# Patient Record
Sex: Female | Born: 1950 | Race: White | Hispanic: No | Smoking: Never smoker
Health system: Southern US, Community
[De-identification: ages and names within clinical notes are randomized; demographics above are authoritative.]

## PROBLEM LIST (undated history)

## (undated) DIAGNOSIS — K7581 Nonalcoholic steatohepatitis (NASH): Secondary | ICD-10-CM

## (undated) DIAGNOSIS — Z6841 Body Mass Index (BMI) 40.0 and over, adult: Secondary | ICD-10-CM

## (undated) DIAGNOSIS — R188 Other ascites: Secondary | ICD-10-CM

## (undated) DIAGNOSIS — A419 Sepsis, unspecified organism: Secondary | ICD-10-CM

## (undated) DIAGNOSIS — K7689 Other specified diseases of liver: Secondary | ICD-10-CM

## (undated) DIAGNOSIS — I700 Atherosclerosis of aorta: Secondary | ICD-10-CM

## (undated) DIAGNOSIS — N200 Calculus of kidney: Secondary | ICD-10-CM

## (undated) DIAGNOSIS — I10 Essential (primary) hypertension: Secondary | ICD-10-CM

## (undated) DIAGNOSIS — I5041 Acute combined systolic (congestive) and diastolic (congestive) heart failure: Secondary | ICD-10-CM

## (undated) DIAGNOSIS — R6521 Severe sepsis with septic shock: Secondary | ICD-10-CM

## (undated) DIAGNOSIS — G4733 Obstructive sleep apnea (adult) (pediatric): Secondary | ICD-10-CM

## (undated) DIAGNOSIS — I428 Other cardiomyopathies: Secondary | ICD-10-CM

## (undated) DIAGNOSIS — I509 Heart failure, unspecified: Secondary | ICD-10-CM

## (undated) DIAGNOSIS — N179 Acute kidney failure, unspecified: Secondary | ICD-10-CM

## (undated) DIAGNOSIS — K529 Noninfective gastroenteritis and colitis, unspecified: Secondary | ICD-10-CM

## (undated) DIAGNOSIS — K5792 Diverticulitis of intestine, part unspecified, without perforation or abscess without bleeding: Secondary | ICD-10-CM

## (undated) DIAGNOSIS — K5790 Diverticulosis of intestine, part unspecified, without perforation or abscess without bleeding: Secondary | ICD-10-CM

## (undated) HISTORY — DX: Body Mass Index (BMI) 40.0 and over, adult: Z684

## (undated) HISTORY — PX: TONSILLECTOMY: SUR1361

## (undated) HISTORY — DX: Diverticulitis of intestine, part unspecified, without perforation or abscess without bleeding: K57.92

## (undated) HISTORY — DX: Diverticulosis of intestine, part unspecified, without perforation or abscess without bleeding: K57.90

## (undated) HISTORY — DX: Essential (primary) hypertension: I10

## (undated) HISTORY — DX: Acute combined systolic (congestive) and diastolic (congestive) heart failure: I50.41

## (undated) HISTORY — DX: Atherosclerosis of aorta: I70.0

## (undated) HISTORY — DX: Calculus of kidney: N20.0

## (undated) HISTORY — DX: Other ascites: R18.8

## (undated) HISTORY — DX: Obstructive sleep apnea (adult) (pediatric): G47.33

## (undated) HISTORY — DX: Other specified diseases of liver: K76.89

## (undated) HISTORY — DX: Acute kidney failure, unspecified: N17.9

## (undated) HISTORY — DX: Nonalcoholic steatohepatitis (NASH): K75.81

## (undated) HISTORY — DX: Noninfective gastroenteritis and colitis, unspecified: K52.9

## (undated) HISTORY — DX: Morbid (severe) obesity due to excess calories: E66.01

## (undated) HISTORY — DX: Severe sepsis with septic shock: R65.21

## (undated) HISTORY — DX: Sepsis, unspecified organism: A41.9

## (undated) HISTORY — DX: Other cardiomyopathies: I42.8

---

## 1982-02-11 HISTORY — PX: DILATION AND CURETTAGE OF UTERUS: SHX78

## 2013-02-11 HISTORY — PX: CARDIAC CATHETERIZATION: SHX172

## 2013-06-11 DIAGNOSIS — K7689 Other specified diseases of liver: Secondary | ICD-10-CM

## 2013-06-11 DIAGNOSIS — I509 Heart failure, unspecified: Secondary | ICD-10-CM

## 2013-06-11 DIAGNOSIS — I5041 Acute combined systolic (congestive) and diastolic (congestive) heart failure: Secondary | ICD-10-CM

## 2013-06-11 DIAGNOSIS — G4733 Obstructive sleep apnea (adult) (pediatric): Secondary | ICD-10-CM

## 2013-06-11 DIAGNOSIS — R188 Other ascites: Secondary | ICD-10-CM

## 2013-06-11 DIAGNOSIS — I428 Other cardiomyopathies: Secondary | ICD-10-CM

## 2013-06-11 HISTORY — DX: Other ascites: R18.8

## 2013-06-11 HISTORY — DX: Other specified diseases of liver: K76.89

## 2013-06-11 HISTORY — DX: Other cardiomyopathies: I42.8

## 2013-06-11 HISTORY — DX: Obstructive sleep apnea (adult) (pediatric): G47.33

## 2013-06-11 HISTORY — DX: Heart failure, unspecified: I50.9

## 2013-06-11 HISTORY — DX: Acute combined systolic (congestive) and diastolic (congestive) heart failure: I50.41

## 2013-07-07 ENCOUNTER — Encounter (HOSPITAL_COMMUNITY): Payer: Self-pay | Admitting: Emergency Medicine

## 2013-07-07 ENCOUNTER — Inpatient Hospital Stay (HOSPITAL_COMMUNITY)
Admission: EM | Admit: 2013-07-07 | Discharge: 2013-07-13 | DRG: 287 | Disposition: A | Discharge status: Home / Self Care | Payer: BC Managed Care – PPO | Attending: Internal Medicine | Admitting: Internal Medicine

## 2013-07-07 ENCOUNTER — Emergency Department (HOSPITAL_COMMUNITY): Payer: BC Managed Care – PPO

## 2013-07-07 DIAGNOSIS — E8779 Other fluid overload: Secondary | ICD-10-CM

## 2013-07-07 DIAGNOSIS — Z79899 Other long term (current) drug therapy: Secondary | ICD-10-CM

## 2013-07-07 DIAGNOSIS — Z8249 Family history of ischemic heart disease and other diseases of the circulatory system: Secondary | ICD-10-CM

## 2013-07-07 DIAGNOSIS — R000 Tachycardia, unspecified: Secondary | ICD-10-CM

## 2013-07-07 DIAGNOSIS — E8770 Fluid overload, unspecified: Secondary | ICD-10-CM | POA: Diagnosis present

## 2013-07-07 DIAGNOSIS — IMO0001 Reserved for inherently not codable concepts without codable children: Secondary | ICD-10-CM

## 2013-07-07 DIAGNOSIS — I509 Heart failure, unspecified: Secondary | ICD-10-CM

## 2013-07-07 DIAGNOSIS — K7460 Unspecified cirrhosis of liver: Secondary | ICD-10-CM

## 2013-07-07 DIAGNOSIS — I428 Other cardiomyopathies: Secondary | ICD-10-CM

## 2013-07-07 DIAGNOSIS — R030 Elevated blood-pressure reading, without diagnosis of hypertension: Secondary | ICD-10-CM

## 2013-07-07 DIAGNOSIS — I5021 Acute systolic (congestive) heart failure: Secondary | ICD-10-CM

## 2013-07-07 DIAGNOSIS — R188 Other ascites: Secondary | ICD-10-CM

## 2013-07-07 DIAGNOSIS — I5041 Acute combined systolic (congestive) and diastolic (congestive) heart failure: Secondary | ICD-10-CM

## 2013-07-07 DIAGNOSIS — K7689 Other specified diseases of liver: Secondary | ICD-10-CM | POA: Diagnosis present

## 2013-07-07 DIAGNOSIS — I110 Hypertensive heart disease with heart failure: Principal | ICD-10-CM | POA: Diagnosis present

## 2013-07-07 DIAGNOSIS — I2789 Other specified pulmonary heart diseases: Secondary | ICD-10-CM | POA: Diagnosis present

## 2013-07-07 DIAGNOSIS — I119 Hypertensive heart disease without heart failure: Secondary | ICD-10-CM

## 2013-07-07 DIAGNOSIS — I10 Essential (primary) hypertension: Secondary | ICD-10-CM

## 2013-07-07 DIAGNOSIS — Z6841 Body Mass Index (BMI) 40.0 and over, adult: Secondary | ICD-10-CM

## 2013-07-07 LAB — COMPREHENSIVE METABOLIC PANEL
ALT: 19 U/L (ref 0–35)
AST: 25 U/L (ref 0–37)
Albumin: 3.3 g/dL — ABNORMAL LOW (ref 3.5–5.2)
Alkaline Phosphatase: 107 U/L (ref 39–117)
BUN: 13 mg/dL (ref 6–23)
CO2: 24 mEq/L (ref 19–32)
Calcium: 10.1 mg/dL (ref 8.4–10.5)
Chloride: 98 mEq/L (ref 96–112)
Creatinine, Ser: 1.05 mg/dL (ref 0.50–1.10)
GFR calc Af Amer: 66 mL/min — ABNORMAL LOW (ref >90)
GFR calc non Af Amer: 57 mL/min — ABNORMAL LOW (ref >90)
Glucose, Bld: 93 mg/dL (ref 70–99)
Potassium: 3.8 mEq/L (ref 3.7–5.3)
Sodium: 137 mEq/L (ref 137–147)
Total Bilirubin: 1.3 mg/dL — ABNORMAL HIGH (ref 0.3–1.2)
Total Protein: 7.1 g/dL (ref 6.0–8.3)

## 2013-07-07 LAB — I-STAT TROPONIN, ED: Troponin i, poc: 0.02 ng/mL (ref 0.00–0.08)

## 2013-07-07 LAB — CBC
HCT: 43.8 % (ref 36.0–46.0)
Hemoglobin: 14.1 g/dL (ref 12.0–15.0)
MCH: 28.5 pg (ref 26.0–34.0)
MCHC: 32.2 g/dL (ref 30.0–36.0)
MCV: 88.7 fL (ref 78.0–100.0)
Platelets: 312 10*3/uL (ref 150–400)
RBC: 4.94 MIL/uL (ref 3.87–5.11)
RDW: 16 % — ABNORMAL HIGH (ref 11.5–15.5)
WBC: 9.2 10*3/uL (ref 4.0–10.5)

## 2013-07-07 LAB — D-DIMER, QUANTITATIVE: D-Dimer, Quant: 1.52 ug/mL-FEU — ABNORMAL HIGH (ref 0.00–0.48)

## 2013-07-07 LAB — PRO B NATRIURETIC PEPTIDE: Pro B Natriuretic peptide (BNP): 3249 pg/mL — ABNORMAL HIGH (ref 0–125)

## 2013-07-07 MED ORDER — ACETAMINOPHEN 650 MG RE SUPP: 650.0000 mg | Freq: Four times a day (QID) | RECTAL | Status: DC | PRN

## 2013-07-07 MED ORDER — IOHEXOL 350 MG/ML SOLN
80.0000 mL | Freq: Once | INTRAVENOUS | Status: AC | PRN
  Administered 2013-07-07: 80 mL via INTRAVENOUS

## 2013-07-07 MED ORDER — SODIUM CHLORIDE 0.9 % IJ SOLN
3.0000 mL | Freq: Two times a day (BID) | INTRAMUSCULAR | Status: DC
  Administered 2013-07-07 – 2013-07-08 (×2): 3 mL via INTRAVENOUS

## 2013-07-07 MED ORDER — SODIUM CHLORIDE 0.9 % IJ SOLN
3.0000 mL | Freq: Two times a day (BID) | INTRAMUSCULAR | Status: DC
  Administered 2013-07-07 – 2013-07-09 (×3): 3 mL via INTRAVENOUS

## 2013-07-07 MED ORDER — ENOXAPARIN SODIUM 40 MG/0.4ML ~~LOC~~ SOLN
40.0000 mg | SUBCUTANEOUS | Status: DC
  Administered 2013-07-07 – 2013-07-12 (×6): 40 mg via SUBCUTANEOUS
  Filled 2013-07-07 (×7): qty 0.4

## 2013-07-07 MED ORDER — DOCUSATE SODIUM 100 MG PO CAPS
100.0000 mg | ORAL_CAPSULE | Freq: Three times a day (TID) | ORAL | Status: DC | PRN
  Filled 2013-07-07: qty 1

## 2013-07-07 MED ORDER — FUROSEMIDE 10 MG/ML IJ SOLN
40.0000 mg | Freq: Four times a day (QID) | INTRAMUSCULAR | Status: DC
  Administered 2013-07-07 – 2013-07-08 (×2): 40 mg via INTRAVENOUS
  Filled 2013-07-07 (×6): qty 4

## 2013-07-07 MED ORDER — POTASSIUM CHLORIDE CRYS ER 20 MEQ PO TBCR
20.0000 meq | EXTENDED_RELEASE_TABLET | Freq: Every day | ORAL | Status: DC
  Administered 2013-07-07 – 2013-07-09 (×3): 20 meq via ORAL
  Filled 2013-07-07 (×5): qty 1

## 2013-07-07 MED ORDER — HYDRALAZINE HCL 20 MG/ML IJ SOLN
10.0000 mg | INTRAMUSCULAR | Status: DC | PRN
Start: 2013-07-07 — End: 2013-07-10
  Administered 2013-07-08: 10 mg via INTRAVENOUS
  Filled 2013-07-07: qty 1

## 2013-07-07 MED ORDER — ACETAMINOPHEN 325 MG PO TABS
650.0000 mg | ORAL_TABLET | Freq: Four times a day (QID) | ORAL | Status: DC | PRN
  Administered 2013-07-08 (×2): 650 mg via ORAL
  Filled 2013-07-07 (×2): qty 2

## 2013-07-07 MED ORDER — ONDANSETRON HCL 4 MG PO TABS: 4.0000 mg | ORAL_TABLET | Freq: Four times a day (QID) | ORAL | Status: DC | PRN

## 2013-07-07 MED ORDER — ONDANSETRON HCL 4 MG/2ML IJ SOLN: 4.0000 mg | Freq: Four times a day (QID) | INTRAMUSCULAR | Status: DC | PRN

## 2013-07-07 MED ORDER — FUROSEMIDE 10 MG/ML IJ SOLN
40.0000 mg | Freq: Once | INTRAMUSCULAR | Status: AC
  Administered 2013-07-07: 40 mg via INTRAVENOUS
  Filled 2013-07-07: qty 4

## 2013-07-07 MED ORDER — ASPIRIN EC 81 MG PO TBEC
81.0000 mg | DELAYED_RELEASE_TABLET | Freq: Every day | ORAL | Status: DC
  Administered 2013-07-07 – 2013-07-11 (×5): 81 mg via ORAL
  Filled 2013-07-07 (×5): qty 1

## 2013-07-07 NOTE — ED Provider Notes (Signed)
CSN: [id number]     Arrival date & time 07/07/13  1703 History   None    Chief Complaint  Patient presents with  . Shortness of Breath    HPI  Phyllis Todd is a 63 y.o. female with no PMH who presents to the ED for evaluation of SOB. History was provided by the patient. Patient states she has had gradually worsening SOB over the past several weeks. She states she has mild SOB at rest which is worse with exertion and ambulation. She also has had to sleep upright at night in a recliner due to SOB. She has had a mild non-productive cough. No hemoptysis. She also complains of bilateral leg edema which has been gradually increasing as well as a swelling in her abdomen throughout. She denies any hx of tobacco abuse. She has not been to a doctor in 16 years. She went to a non-established provider today and was sent to the ED for further evaluation. Has an appointment next month to establish care with a doctor at [facility], but does not currently have a PCP. She denies any chest pain. No history of DVT, PE, clotting disorder, cancer, recent surgery, immobilization, travel, or estrogen supplementation. She denies any abdominal pain, nausea, or emesis. She has had good appetite and activity. She has had mild constipation but her last BM was this morning. She denies any PMH. States she feels a little anxiety about being sent to the ED.    History reviewed. No pertinent past medical history. Past Surgical History  Procedure Laterality Date  . Cesarean section     History reviewed. No pertinent family history. History  Substance Use Topics  . Smoking status: Never Smoker   . Smokeless tobacco: Not on file  . Alcohol Use: No   OB History   Grav Para Term Preterm Abortions TAB SAB Ect Mult Living                 Review of Systems  Constitutional: Negative for fever, chills, diaphoresis, activity change, appetite change and fatigue.  HENT: Negative for congestion, rhinorrhea and sore throat.    Respiratory: Positive for cough and shortness of breath. Negative for chest tightness and wheezing.   Cardiovascular: Positive for leg swelling. Negative for chest pain.  Gastrointestinal: Positive for constipation and abdominal distention. Negative for nausea, vomiting, abdominal pain, diarrhea, blood in stool and rectal pain.  Genitourinary: Negative for dysuria and difficulty urinating.  Musculoskeletal: Negative for back pain, gait problem, myalgias and neck pain.  Skin: Negative for color change and wound.  Neurological: Negative for dizziness, weakness, light-headedness and headaches.  Psychiatric/Behavioral: The patient is nervous/anxious.     Allergies  Codeine and Demerol  Home Medications   Prior to Admission medications   Not on File   BP 166/110  Pulse 110  Temp(Src) 97.7 F (36.5 C) (Oral)  Resp 24  Ht 5\' 4"  (1.626 m)  Wt 370 lb 8 oz (168.058 kg)  BMI 63.57 kg/m2  SpO2 97%  Filed Vitals:   07/07/13 2104 07/07/13 2132 07/07/13 2326 07/08/13 0126  BP: 184/95 195/93 160/92 165/103  Pulse: 113 111  106  Temp:  98.1 F (36.7 C)  97.3 F (36.3 C)  TempSrc:  Oral  Oral  Resp:  20  20  Height:  5\' 4"  (1.626 m)    Weight:  364 lb 9.6 oz (165.381 kg)    SpO2: 99% 96%  95%    Physical Exam  Nursing note and vitals  reviewed. Constitutional: She is oriented to person, place, and time. She appears well-developed and well-nourished. No distress.  HENT:  Head: Normocephalic and atraumatic.  Right Ear: External ear normal.  Left Ear: External ear normal.  Nose: Nose normal.  Mouth/Throat: Oropharynx is clear and moist. No oropharyngeal exudate.  Eyes: Conjunctivae are normal. Right eye exhibits no discharge. Left eye exhibits no discharge.  Neck: Normal range of motion. Neck supple.  Cardiovascular: Normal rate, regular rhythm and normal heart sounds.  Exam reveals no gallop and no friction rub.   No murmur heard. Pulmonary/Chest: Effort normal and breath sounds  normal. No respiratory distress. She has no wheezes. She has no rales. She exhibits no tenderness.  Crackles heard in the right lung base. Patient able to speak in full sentences. Is currently on 2L O2. Mild increased respiratory effort.   Abdominal: Soft. Bowel sounds are normal. She exhibits distension. She exhibits no mass. There is no tenderness. There is no rebound and no guarding.  Distended protuberant abdomen with pitting trace edema throughout. No tenderness.   Musculoskeletal: Normal range of motion. She exhibits edema. She exhibits no tenderness.  Pitting leg edema throughout equal bilaterally. Rough cracked skin in the lower extremities. No open wounds. No tenderness to palpation to the LE throughout  Neurological: She is alert and oriented to person, place, and time.  Skin: Skin is warm and dry. She is not diaphoretic.     ED Course  Procedures (including critical care time) Labs Review Labs Reviewed  CBC  PRO B NATRIURETIC PEPTIDE  COMPREHENSIVE METABOLIC PANEL  Randolm Idol, ED    Imaging Review Dg Chest 2 View  07/07/2013   CLINICAL DATA:  Difficulty breathing. No history of smoking. Chest pain.  EXAM: CHEST  2 VIEW  COMPARISON:  None.  FINDINGS: The heart is enlarged. The mediastinal contours are normal. There is interstitial prominence throughout both lungs without abnormality of the pulmonary vasculature. There is no confluent airspace opacity or significant pleural effusion. The osseous structures appear normal.  IMPRESSION: Cardiomegaly with generalized interstitial prominence. No definite edema or focal airspace disease.   Electronically Signed   By: Camie Patience M.D.   On: 07/07/2013 19:55   Dg Abd 1 View  07/07/2013   CLINICAL DATA:  Difficulty breathing when lying supine.  Nonsmoker.  EXAM: ABDOMEN - 1 VIEW  COMPARISON:  None.  FINDINGS: Single erect view of the abdomen demonstrates no evidence of pneumoperitoneum. The visualized bowel gas pattern is normal. The  pelvis is not imaged. No suspicious calcifications are seen.  IMPRESSION: No evidence of pneumoperitoneum. The visualized abdominal contents appear unremarkable.   Electronically Signed   By: Camie Patience M.D.   On: 07/07/2013 19:56   Ct Angio Chest Pe W/cm &/or Wo Cm  07/07/2013   CLINICAL DATA:  SOB, bilateral edema  EXAM: CT ANGIOGRAPHY CHEST WITH CONTRAST  TECHNIQUE: Multidetector CT imaging of the chest was performed using the standard protocol during bolus administration of intravenous contrast. Multiplanar CT image reconstructions and MIPs were obtained to evaluate the vascular anatomy.  CONTRAST:  25mL OMNIPAQUE IOHEXOL 350 MG/ML SOLN  COMPARISON:  None.  FINDINGS: Satisfactory opacification of pulmonary arteries noted, and there is no evidence of pulmonary emboli. Patient breathing during the acquisition degrades some of the images. There is four-chamber cardiac enlargement. Thoracic aorta is nondilated, incompletely opacified. Small bilateral pleural effusions. No pericardial effusion. Subcentimeter prevascular, AP window, precarinal, and right paratracheal lymph nodes. No definite hilar adenopathy. Extensive ground-glass opacities  throughout both lungs involving the upper lobes more than bases.  There is a small amount of perihepatic and perisplenic ascites. Remainder visualized upper abdomen unremarkable. Small spurs at multiple contiguous levels in the thoracic spine.  Review of the MIP images confirms the above findings.  IMPRESSION: 1. No large or central pulmonary emboli. 2. Extensive ground-glass opacities throughout both lungs, favor edema over alveolitis. 3. Perihepatic and perisplenic ascites.   Electronically Signed   By: Arne Cleveland M.D.   On: 07/07/2013 20:20     EKG Interpretation   Date/Time:  Wednesday Jul 07 2013 18:45:46 EDT Ventricular Rate:  111 PR Interval:  147 QRS Duration: 87 QT Interval:  334 QTC Calculation: 829 R Axis:   97 Text Interpretation:  Sinus  tachycardia Right axis deviation Low voltage,  precordial leads Probable anteroseptal infarct, old Minimal ST depression,  lateral leads Confirmed by WARD,  DO, KRISTEN [phone]) on 07/07/2013 6:48:12  PM      Results for orders placed during the hospital encounter of 07/07/13  CBC      Result Value Ref Range   WBC 9.2  4.0 - 10.5 K/uL   RBC 4.94  3.87 - 5.11 MIL/uL   Hemoglobin 14.1  12.0 - 15.0 g/dL   HCT 43.8  36.0 - 46.0 %   MCV 88.7  78.0 - 100.0 fL   MCH 28.5  26.0 - 34.0 pg   MCHC 32.2  30.0 - 36.0 g/dL   RDW 16.0 (*) 11.5 - 15.5 %   Platelets 312  150 - 400 K/uL  PRO B NATRIURETIC PEPTIDE      Result Value Ref Range   Pro B Natriuretic peptide (BNP) 3249.0 (*) 0 - 125 pg/mL  COMPREHENSIVE METABOLIC PANEL      Result Value Ref Range   Sodium 137  137 - 147 mEq/L   Potassium 3.8  3.7 - 5.3 mEq/L   Chloride 98  96 - 112 mEq/L   CO2 24  19 - 32 mEq/L   Glucose, Bld 93  70 - 99 mg/dL   BUN 13  6 - 23 mg/dL   Creatinine, Ser 1.05  0.50 - 1.10 mg/dL   Calcium 10.1  8.4 - 10.5 mg/dL   Total Protein 7.1  6.0 - 8.3 g/dL   Albumin 3.3 (*) 3.5 - 5.2 g/dL   AST 25  0 - 37 U/L   ALT 19  0 - 35 U/L   Alkaline Phosphatase 107  39 - 117 U/L   Total Bilirubin 1.3 (*) 0.3 - 1.2 mg/dL   GFR calc non Af Amer 57 (*) >90 mL/min   GFR calc Af Amer 66 (*) >90 mL/min  D-DIMER, QUANTITATIVE      Result Value Ref Range   D-Dimer, Quant 1.52 (*) 0.00 - 0.48 ug/mL-FEU  I-STAT TROPOININ, ED      Result Value Ref Range   Troponin i, poc 0.02  0.00 - 0.08 ng/mL   Comment 3              MDM   Phyllis Todd is a 63 y.o. female with no PMH who presents to the ED for evaluation of SOB. SOB likely due to new onset CHF. Patient's chest x-ray revealed cardiomegaly with generalized interstitial prominence and no definite edema. BNP elevated at 3249 however baseline unknown. Patient denied any chest pain. EKG negative for any acute ischemic changes. Troponin negative. Patient's HR elevated in the 110's  during her ED visit. D-dimer elevated.  CT chest negative for PE however is suggestive of pulmonary edema and abdominal ascites, which is also likely from fluid overload. No hypoxia. Patient given IV lasix in the ED with increased urine production. Her BP was elevated during her ED visit with no hx of HTN or other dx medical conditions. Patient had no abdominal pain and her abdominal exam was benign with no peritoneal signs. Abdominal series negative. Patient admitted for further evaluation and management (Dr. Hal Hope accepting).    Final impressions: 1. Congestive heart failure   2. Ascites   3. Tachycardia   4. Hypertension   5. Fluid overload      Mercy Moore PA-C   This patient was discussed with Dr. Kathlee Nations, PA-C 07/08/13 [phone]

## 2013-07-07 NOTE — H&P (Signed)
[facility] History and Physical  Phyllis Todd YNW:[id number] DOB: November 24, 1950 DOA: 07/07/2013  Referring physician: ER physician. PCP: PROVIDER NOT IN SYSTEM  Chief Complaint: Shortness of breath and increasing abdominal girth.  HPI: Phyllis Todd is a 63 y.o. female with no significant past medical history has been experiencing increasing shortness of breath over the last 2 months with increasing abdominal girth and lower extremity edema. Patient has been sleeping propped up over the last 2 months. Denies any chest pain fever chills. Has been having some nonproductive cough. In the ER chest x-ray shows cardiomegaly and CT head of the chest shows diffuse groundglass opacity concerning for alveolitis versus CHF. Patient's BNP has been elevated and on exam patient has significant abdominal distention and lower extremity edema. Patient has made good urine output after Lasix 40 mg IV. Patient has been admitted for further workup. Patient denies any nausea vomiting abdominal pain diarrhea headache visual symptoms.   Review of Systems: As presented in the history of presenting illness, rest negative.  Past Medical History  Diagnosis Date  . Medical history non-contributory    Past Surgical History  Procedure Laterality Date  . Cesarean section    . Tonsillectomy     Social History:  reports that she has never smoked. She does not have any smokeless tobacco history on file. She reports that she does not drink alcohol or use illicit drugs. Where does patient live home. Can patient participate in ADLs? Yes.  Allergies  Allergen Reactions  . Codeine Other (See Comments)    "makes me hyper"  . Demerol [Meperidine] Other (See Comments)    "sick"  . Peanut-Containing Drug Products Hives and Swelling    "almonds"    Family History:  Family History  Problem Relation Age of Onset  . Stroke Mother   . Hypertension Mother   . CAD Father   . Hypertension Father       Prior to Admission  medications   Medication Sig Start Date End Date Taking? Authorizing Provider  acetaminophen (TYLENOL) 500 MG tablet Take 500 mg by mouth every 6 (six) hours as needed (pain).   Yes Historical Provider, MD  diphenhydrAMINE (BENADRYL) 25 mg capsule Take 25 mg by mouth every 8 (eight) hours as needed for allergies.    Yes Historical Provider, MD  docusate sodium (COLACE) 100 MG capsule Take 100 mg by mouth 3 (three) times daily as needed for mild constipation.   Yes Historical Provider, MD  Multiple Vitamin (MULTIVITAMIN WITH MINERALS) TABS tablet Take 1 tablet by mouth daily.   Yes Historical Provider, MD    Physical Exam: Filed Vitals:   07/07/13 1724 07/07/13 1749 07/07/13 2018 07/07/13 2019  BP:  179/117 168/115   Pulse:  116  116  Temp:  97.8 F (36.6 C)    TempSrc:  Oral    Resp:  27  29  Height: 5\' 4"  (1.626 m)     Weight: 168.058 kg (370 lb 8 oz)     SpO2:  96%  97%     General:  Well-developed and nourished.  Eyes: Anicteric no pallor.  ENT: No discharge from ears eyes nose mouth.  Neck: No mass felt. JVD not appreciated.  Cardiovascular: S1-S2 heard.  Respiratory: No rhonchi or crepitations.  Abdomen: Distended nontender no guarding or rigidity. Bowel sounds present.  Skin: Chronic skin changes in the lower extremity.  Musculoskeletal: Bilateral lower extremity edema.  Psychiatric: Appears normal.  Neurologic: Alert awake oriented to time place and person.  Moves all extremities.  Labs on Admission:  Basic Metabolic Panel:  Recent Labs Lab 07/07/13 1738  NA 137  K 3.8  CL 98  CO2 24  GLUCOSE 93  BUN 13  CREATININE 1.05  CALCIUM 10.1   Liver Function Tests:  Recent Labs Lab 07/07/13 1738  AST 25  ALT 19  ALKPHOS 107  BILITOT 1.3*  PROT 7.1  ALBUMIN 3.3*   No results found for this basename: LIPASE, AMYLASE,  in the last 168 hours No results found for this basename: AMMONIA,  in the last 168 hours CBC:  Recent Labs Lab 07/07/13 1738   WBC 9.2  HGB 14.1  HCT 43.8  MCV 88.7  PLT 312   Cardiac Enzymes: No results found for this basename: CKTOTAL, CKMB, CKMBINDEX, TROPONINI,  in the last 168 hours  BNP (last 3 results)  Recent Labs  07/07/13 1738  PROBNP 3249.0*   CBG: No results found for this basename: GLUCAP,  in the last 168 hours  Radiological Exams on Admission: Dg Chest 2 View  07/07/2013   CLINICAL DATA:  Difficulty breathing. No history of smoking. Chest pain.  EXAM: CHEST  2 VIEW  COMPARISON:  None.  FINDINGS: The heart is enlarged. The mediastinal contours are normal. There is interstitial prominence throughout both lungs without abnormality of the pulmonary vasculature. There is no confluent airspace opacity or significant pleural effusion. The osseous structures appear normal.  IMPRESSION: Cardiomegaly with generalized interstitial prominence. No definite edema or focal airspace disease.   Electronically Signed   By: Camie Patience M.D.   On: 07/07/2013 19:55   Dg Abd 1 View  07/07/2013   CLINICAL DATA:  Difficulty breathing when lying supine.  Nonsmoker.  EXAM: ABDOMEN - 1 VIEW  COMPARISON:  None.  FINDINGS: Single erect view of the abdomen demonstrates no evidence of pneumoperitoneum. The visualized bowel gas pattern is normal. The pelvis is not imaged. No suspicious calcifications are seen.  IMPRESSION: No evidence of pneumoperitoneum. The visualized abdominal contents appear unremarkable.   Electronically Signed   By: Camie Patience M.D.   On: 07/07/2013 19:56   Ct Angio Chest Pe W/cm &/or Wo Cm  07/07/2013   CLINICAL DATA:  SOB, bilateral edema  EXAM: CT ANGIOGRAPHY CHEST WITH CONTRAST  TECHNIQUE: Multidetector CT imaging of the chest was performed using the standard protocol during bolus administration of intravenous contrast. Multiplanar CT image reconstructions and MIPs were obtained to evaluate the vascular anatomy.  CONTRAST:  42mL OMNIPAQUE IOHEXOL 350 MG/ML SOLN  COMPARISON:  None.  FINDINGS:  Satisfactory opacification of pulmonary arteries noted, and there is no evidence of pulmonary emboli. Patient breathing during the acquisition degrades some of the images. There is four-chamber cardiac enlargement. Thoracic aorta is nondilated, incompletely opacified. Small bilateral pleural effusions. No pericardial effusion. Subcentimeter prevascular, AP window, precarinal, and right paratracheal lymph nodes. No definite hilar adenopathy. Extensive ground-glass opacities throughout both lungs involving the upper lobes more than bases.  There is a small amount of perihepatic and perisplenic ascites. Remainder visualized upper abdomen unremarkable. Small spurs at multiple contiguous levels in the thoracic spine.  Review of the MIP images confirms the above findings.  IMPRESSION: 1. No large or central pulmonary emboli. 2. Extensive ground-glass opacities throughout both lungs, favor edema over alveolitis. 3. Perihepatic and perisplenic ascites.   Electronically Signed   By: Arne Cleveland M.D.   On: 07/07/2013 20:20    EKG: Independently reviewed. Sinus tachycardia with poor R-wave progression.  Assessment/Plan Principal  Problem:   Fluid overload   1. Fluid overload with shortness of breath - symptoms are concerning for new onset CHF. Patient has been placed on Lasix 40 mg IV every 6 hourly. Check 2-D echo. Check sonogram of abdomen. Closely follow intake output and metabolic panel. Consult cardiology in a.m. 2. Elevated blood pressure - for now I have placed patient on when necessary IV hydralazine for systolic blood pressure more than 160.    Code Status: Full code.  Family Communication: None.  Disposition Plan: Admit to inpatient.    [facility] Pager [phone].  If 7PM-7AM, please contact night-coverage [email or web address] Password [facility] 07/07/2013, 8:57 PM

## 2013-07-07 NOTE — ED Notes (Addendum)
Pt to ED from PCP office c/o shortness of breath on exertion over a period of 2 months. Bilateral edema noted; reports swelling is per baseline for pt. Denies chest pain. Also c/o worsening abdominal swelling x 2 months with intermittent constipation. PCP sent pt here for further eval to r/o possible PE or CHF

## 2013-07-07 NOTE — ED Provider Notes (Signed)
Medical screening examination/treatment/procedure(s) were conducted as a shared visit with non-physician practitioner(s) and myself.  I personally evaluated the patient during the encounter.   EKG Interpretation  Date/Time:  Wednesday Jul 07 2013 18:45:46 EDT Ventricular Rate:  111 PR Interval:  147 QRS Duration: 87 QT Interval:  334 QTC Calculation: 454 R Axis:   97 Text Interpretation:  Sinus tachycardia Right axis deviation Low voltage, precordial leads Probable anteroseptal infarct, old Minimal ST depression, lateral leads Confirmed by Jarquis Walker,  DO, Palma Buster [phone]) on 07/07/2013 6:48:12 PM        Pt is a 63 y.o. F with no known past medical history because she has not been seen by her PCP in over 16 years who presents with shortness of breath for several months it is progressively worsening and lower extremity swelling. On exam, patient is tachycardic, tachypneic, hypoxic and hypertensive. She is able to speak full sentences however has no respiratory distress. She has mild tremors of her bilateral bases. She has significant edema and her mother extremities to her abdomen. Differential diagnosis includes CHF exacerbation, PE, ACS. We'll obtain cardiac labs, d dimer, chest x-ray. Patient will need admission.  [place], DO 07/07/13 2348

## 2013-07-08 ENCOUNTER — Inpatient Hospital Stay (HOSPITAL_COMMUNITY): Payer: BC Managed Care – PPO

## 2013-07-08 DIAGNOSIS — I517 Cardiomegaly: Secondary | ICD-10-CM

## 2013-07-08 DIAGNOSIS — I509 Heart failure, unspecified: Secondary | ICD-10-CM

## 2013-07-08 DIAGNOSIS — R030 Elevated blood-pressure reading, without diagnosis of hypertension: Secondary | ICD-10-CM

## 2013-07-08 LAB — BASIC METABOLIC PANEL
BUN: 13 mg/dL (ref 6–23)
CO2: 29 mEq/L (ref 19–32)
Calcium: 10.2 mg/dL (ref 8.4–10.5)
Chloride: 99 mEq/L (ref 96–112)
Creatinine, Ser: 1.04 mg/dL (ref 0.50–1.10)
GFR calc Af Amer: 66 mL/min — ABNORMAL LOW (ref >90)
GFR calc non Af Amer: 57 mL/min — ABNORMAL LOW (ref >90)
Glucose, Bld: 79 mg/dL (ref 70–99)
Potassium: 3.8 mEq/L (ref 3.7–5.3)
Sodium: 143 mEq/L (ref 137–147)

## 2013-07-08 LAB — CBC
HCT: 41.8 % (ref 36.0–46.0)
Hemoglobin: 13.3 g/dL (ref 12.0–15.0)
MCH: 28.2 pg (ref 26.0–34.0)
MCHC: 31.8 g/dL (ref 30.0–36.0)
MCV: 88.6 fL (ref 78.0–100.0)
Platelets: 346 10*3/uL (ref 150–400)
RBC: 4.72 MIL/uL (ref 3.87–5.11)
RDW: 16 % — ABNORMAL HIGH (ref 11.5–15.5)
WBC: 9.8 10*3/uL (ref 4.0–10.5)

## 2013-07-08 LAB — TSH: TSH: 3.48 u[IU]/mL (ref 0.350–4.500)

## 2013-07-08 MED ORDER — LISINOPRIL 5 MG PO TABS
5.0000 mg | ORAL_TABLET | Freq: Every day | ORAL | Status: DC
  Administered 2013-07-08 – 2013-07-13 (×6): 5 mg via ORAL
  Filled 2013-07-08 (×6): qty 1

## 2013-07-08 MED ORDER — FUROSEMIDE 10 MG/ML IJ SOLN
8.0000 mg/h | INTRAVENOUS | Status: DC
  Administered 2013-07-08 – 2013-07-09 (×2): 8 mg/h via INTRAVENOUS
  Filled 2013-07-08 (×4): qty 25

## 2013-07-08 MED ORDER — CARVEDILOL 3.125 MG PO TABS
3.1250 mg | ORAL_TABLET | Freq: Two times a day (BID) | ORAL | Status: DC
  Administered 2013-07-08 – 2013-07-09 (×2): 3.125 mg via ORAL
  Filled 2013-07-08 (×4): qty 1

## 2013-07-08 MED ORDER — SALINE SPRAY 0.65 % NA SOLN
1.0000 | NASAL | Status: DC | PRN
  Administered 2013-07-08: 1 via NASAL
  Filled 2013-07-08: qty 44

## 2013-07-08 MED ORDER — POTASSIUM CHLORIDE CRYS ER 20 MEQ PO TBCR
40.0000 meq | EXTENDED_RELEASE_TABLET | Freq: Two times a day (BID) | ORAL | Status: AC
  Administered 2013-07-08 (×2): 40 meq via ORAL
  Filled 2013-07-08 (×2): qty 2

## 2013-07-08 NOTE — Progress Notes (Signed)
[facility] PROGRESS NOTE Interim History: 63 y.o. female with no significant past medical history has been experiencing increasing shortness of breath over the last 2 months with increasing abdominal girth and lower extremity edema. Patient has been sleeping propped up over the last 2 months. Denies any chest pain fever chills.  Filed Weights   07/07/13 1724 07/07/13 2132 07/08/13 0519  Weight: 168.058 kg (370 lb 8 oz) 165.381 kg (364 lb 9.6 oz) 161.208 kg (355 lb 6.4 oz)        Intake/Output Summary (Last 24 hours) at 07/08/13 0746 Last data filed at 07/08/13 2993  Gross per 24 hour  Intake    480 ml  Output   6955 ml  Net  -6475 ml     Assessment/Plan: Fluid overload most likely due to heart failure: - Echo pending, Good UOP overnight. - Fluid restriction, strict I and O's. Daily weights. - monitor electrolytes. - start ACE-I and BB. Echo pending.   Elevated blood pressure - diuretic, ACE-I and beta-blocker.    Code Status: full Family Communication: friend  Disposition Plan: inpatient   Consultants:  none  Procedures: ECHO: pending  Antibiotics:  None  HPI/Subjective: Relates her abdominal discomfort is improved.  Objective: Filed Vitals:   07/07/13 2326 07/08/13 0126 07/08/13 0519 07/08/13 0641  BP: 160/92 165/103 162/89 152/84  Pulse:  106 108   Temp:  97.3 F (36.3 C) 98.3 F (36.8 C)   TempSrc:  Oral Oral   Resp:  20 20   Height:      Weight:   161.208 kg (355 lb 6.4 oz)   SpO2:  95% 95%      Exam:  General: Alert, awake, oriented x3, in no acute distress. Morbidly obese, anasarca. HEENT: No bruits, no goiter.  Heart: Regular rate and rhythm, without murmurs, rubs, gallops.  Lungs: Good air movement, clear Abdomen: Soft, nontender, nondistended, positive bowel sounds.     Data Reviewed: Basic Metabolic Panel:  Recent Labs Lab 07/07/13 1738 07/08/13 0358  NA 137 143  K 3.8 3.8  CL 98 99  CO2 24 29  GLUCOSE 93 79    BUN 13 13  CREATININE 1.05 1.04  CALCIUM 10.1 10.2   Liver Function Tests:  Recent Labs Lab 07/07/13 1738  AST 25  ALT 19  ALKPHOS 107  BILITOT 1.3*  PROT 7.1  ALBUMIN 3.3*   No results found for this basename: LIPASE, AMYLASE,  in the last 168 hours No results found for this basename: AMMONIA,  in the last 168 hours CBC:  Recent Labs Lab 07/07/13 1738 07/08/13 0358  WBC 9.2 9.8  HGB 14.1 13.3  HCT 43.8 41.8  MCV 88.7 88.6  PLT 312 346   Cardiac Enzymes: No results found for this basename: CKTOTAL, CKMB, CKMBINDEX, TROPONINI,  in the last 168 hours BNP (last 3 results)  Recent Labs  07/07/13 1738  PROBNP 3249.0*   CBG: No results found for this basename: GLUCAP,  in the last 168 hours  No results found for this or any previous visit (from the past 240 hour(s)).   Studies: Dg Chest 2 View  07/07/2013   CLINICAL DATA:  Difficulty breathing. No history of smoking. Chest pain.  EXAM: CHEST  2 VIEW  COMPARISON:  None.  FINDINGS: The heart is enlarged. The mediastinal contours are normal. There is interstitial prominence throughout both lungs without abnormality of the pulmonary vasculature. There is no confluent airspace opacity or significant pleural effusion. The osseous structures  appear normal.  IMPRESSION: Cardiomegaly with generalized interstitial prominence. No definite edema or focal airspace disease.   Electronically Signed   By: Camie Patience M.D.   On: 07/07/2013 19:55   Dg Abd 1 View  07/07/2013   CLINICAL DATA:  Difficulty breathing when lying supine.  Nonsmoker.  EXAM: ABDOMEN - 1 VIEW  COMPARISON:  None.  FINDINGS: Single erect view of the abdomen demonstrates no evidence of pneumoperitoneum. The visualized bowel gas pattern is normal. The pelvis is not imaged. No suspicious calcifications are seen.  IMPRESSION: No evidence of pneumoperitoneum. The visualized abdominal contents appear unremarkable.   Electronically Signed   By: Camie Patience M.D.   On:  07/07/2013 19:56   Ct Angio Chest Pe W/cm &/or Wo Cm  07/07/2013   CLINICAL DATA:  SOB, bilateral edema  EXAM: CT ANGIOGRAPHY CHEST WITH CONTRAST  TECHNIQUE: Multidetector CT imaging of the chest was performed using the standard protocol during bolus administration of intravenous contrast. Multiplanar CT image reconstructions and MIPs were obtained to evaluate the vascular anatomy.  CONTRAST:  51mL OMNIPAQUE IOHEXOL 350 MG/ML SOLN  COMPARISON:  None.  FINDINGS: Satisfactory opacification of pulmonary arteries noted, and there is no evidence of pulmonary emboli. Patient breathing during the acquisition degrades some of the images. There is four-chamber cardiac enlargement. Thoracic aorta is nondilated, incompletely opacified. Small bilateral pleural effusions. No pericardial effusion. Subcentimeter prevascular, AP window, precarinal, and right paratracheal lymph nodes. No definite hilar adenopathy. Extensive ground-glass opacities throughout both lungs involving the upper lobes more than bases.  There is a small amount of perihepatic and perisplenic ascites. Remainder visualized upper abdomen unremarkable. Small spurs at multiple contiguous levels in the thoracic spine.  Review of the MIP images confirms the above findings.  IMPRESSION: 1. No large or central pulmonary emboli. 2. Extensive ground-glass opacities throughout both lungs, favor edema over alveolitis. 3. Perihepatic and perisplenic ascites.   Electronically Signed   By: Arne Cleveland M.D.   On: 07/07/2013 20:20    Scheduled Meds: . aspirin EC  81 mg Oral Daily  . enoxaparin (LOVENOX) injection  40 mg Subcutaneous Q24H  . furosemide  40 mg Intravenous Q6H  . potassium chloride  20 mEq Oral Daily  . sodium chloride  3 mL Intravenous Q12H  . sodium chloride  3 mL Intravenous Q12H   Continuous Infusions:    [facility] Pager [phone] If 8PM-8AM, please contact night-coverage at [email or web address], password  [facility] 07/08/2013, 7:46 AM  LOS: 1 day

## 2013-07-08 NOTE — Care Management Note (Addendum)
  Page 2 of 2   07/13/2013     12:49:28 PM CARE MANAGEMENT NOTE 07/13/2013  Patient:  Phyllis Todd, Phyllis Todd   Account Number:  [id number]  Date Initiated:  07/08/2013  Documentation initiated by:  Mariann Laster  Subjective/Objective Assessment:   Admitted with Shortness of breath and increasing abdominal girth.     Action/Plan:   CM to follow for disposition needs   Anticipated DC Date:  07/11/2013   Anticipated DC Plan:  [place]  CM consult  Medication Assistance      Choice offered to / List presented to:             Status of service:  Completed, signed off Medicare Important Message given?   (If response is "NO", the following Medicare IM given date fields will be blank) Date Medicare IM given:   Date Additional Medicare IM given:    Discharge Disposition:  HOME/SELF CARE  Per UR Regulation:  Reviewed for med. necessity/level of care/duration of stay  If discussed at [place] of Stay Meetings, dates discussed:    Comments:  Cedarius Kersh RN, BSN, MSHL, CCM  Nurse - Case Manager, (Unit [place])  [phone]  07/13/2013 Self pay - CM consulted [place] financial advisor who confirms following patient; states patient will not qualify for MCD d/t not disabled and no children in the home.  [facility] Assistance program will be offered to patient. IM:  N/A - self pay Social:  Divorced from home with parents PCP:  None - patient to be followed by the [facility] Dispositon Plan:  Home / Self care to be followed by [place] RN, BSN, MSHL, CCM  Nurse - Case Manager, (Unit [place])  [phone]  07/12/2013 IM:  N/A - self pay Social:  Divorced from home with parents PCP:  None Intensity of Service:  Increased Creat level; Bolus administration, NPO following Full Liquid meal for pending heart cath procedure Dispositon Plan: $4.00 list at time of d/c d/t self pay    Denni France RN, BSN, MSHL, CCM  Nurse - Case Manager, (Unit [facility])   [phone]  07/08/2013 IM:  N/A - self pay Social:  Divorced from home with parents PCP:  None Intensity of Service:  Iv Lasix changed to IV gtt Dispositon Plan: CM encouraged MD to explore $4.00 list at time of d/c d/t self pay

## 2013-07-08 NOTE — Progress Notes (Signed)
  Echocardiogram 2D Echocardiogram has been performed.  Carney Corners 07/08/2013, 4:46 PM

## 2013-07-08 NOTE — Progress Notes (Signed)
Pt c/o of 3/10 pain in back and a achy stomach.  Pt given tylenol with no relief, MD notified.  Will continue to monitor.

## 2013-07-08 NOTE — Progress Notes (Signed)
Notified hospitalist patient requesting nasal saline spray for patient states nasal passages are real dry from the air conditioning in the room. Orders given for nasal spray and will administer as ordered and continue to monitor patient to end of shift.

## 2013-07-08 NOTE — Progress Notes (Signed)
UR completed [place] K. Tayona Sarnowski, RN, BSN, [place], CCM  07/08/2013 11:16 AM

## 2013-07-09 DIAGNOSIS — K7460 Unspecified cirrhosis of liver: Secondary | ICD-10-CM

## 2013-07-09 DIAGNOSIS — R000 Tachycardia, unspecified: Secondary | ICD-10-CM

## 2013-07-09 DIAGNOSIS — R188 Other ascites: Secondary | ICD-10-CM

## 2013-07-09 LAB — BASIC METABOLIC PANEL
BUN: 12 mg/dL (ref 6–23)
CO2: 31 mEq/L (ref 19–32)
Calcium: 9.5 mg/dL (ref 8.4–10.5)
Chloride: 97 mEq/L (ref 96–112)
Creatinine, Ser: 1.08 mg/dL (ref 0.50–1.10)
GFR calc Af Amer: 63 mL/min — ABNORMAL LOW (ref >90)
GFR calc non Af Amer: 55 mL/min — ABNORMAL LOW (ref >90)
Glucose, Bld: 93 mg/dL (ref 70–99)
Potassium: 3.7 mEq/L (ref 3.7–5.3)
Sodium: 140 mEq/L (ref 137–147)

## 2013-07-09 MED ORDER — CARVEDILOL 6.25 MG PO TABS
6.2500 mg | ORAL_TABLET | Freq: Two times a day (BID) | ORAL | Status: DC
  Administered 2013-07-09 – 2013-07-11 (×4): 6.25 mg via ORAL
  Filled 2013-07-09 (×7): qty 1

## 2013-07-09 NOTE — Clinical Documentation Improvement (Signed)
#  1 Please clarify acuity and type CHF. Thank you  Possible Clinical Conditions?  Chronic Systolic Congestive Heart Failure Chronic Diastolic Congestive Heart Failure Chronic Systolic & Diastolic Congestive Heart Failure Acute Systolic Congestive Heart Failure Acute Diastolic Congestive Heart Failure Acute Systolic & Diastolic Congestive Heart Failure Acute on Chronic Systolic Congestive Heart Failure Acute on Chronic Diastolic Congestive Heart Failure Acute on Chronic Systolic & Diastolic Congestive Heart Failure Other Condition________________________________________ Cannot Clinically Determine  Supporting Information:  Risk Factors: H&P: Fluid overload with shortness of breath - symptoms are concerning for new onset CHF 5/28&29 progress note: Fluid overload most likely due to heart failure:  Signs & Symptoms: Fluid overload has been experiencing increasing shortness of breath over the last 2 months with increasing abdominal girth and lower extremity edema. Musculoskeletal: Bilateral lower extremity edema.  Diagnostics: Echo 5/28:  EF 35-40% 5/27 BNP= 3249 5/27 CXR: IMPRESSION: Cardiomegaly with generalized interstitial prominence. No definite  edema or focal airspace disease.  Treatment: H&P: Patient has been placed on Lasix 40 mg IV every 6 hourly. Check 2-D echo. Check sonogram of abdomen. Closely follow intake output and metabolic panel. Consult cardiology in a.m. 5/28:  Fluid restriction, strict I and O's. Daily weights. - monitor electrolytes. - start ACE-I and BB  #2 Please clarify BMI. Thank you.  Possible Clinical conditions  Morbid Obesity W/ BMI Other condition___________________ Cannot clinically determine _____________  Risk Factors: Sign & Symptoms: Ht: 5'4"  Wt. 345 lbs  BMI  63.56  Treatment Daily weights Cardiac diet w/1200 ml fluid restriction  Thank You, Phyllis Todd ,RN Clinical Documentation Specialist:  [place] Information Management

## 2013-07-09 NOTE — Progress Notes (Signed)
[facility] PROGRESS NOTE Interim History: 63 y.o. female with no significant past medical history has been experiencing increasing shortness of breath over the last 2 months with increasing abdominal girth and lower extremity edema. Patient has been sleeping propped up over the last 2 months. Denies any chest pain fever chills.  Filed Weights   07/07/13 2132 07/08/13 0519 07/09/13 0500  Weight: 165.381 kg (364 lb 9.6 oz) 161.208 kg (355 lb 6.4 oz) 156.5 kg (345 lb 0.3 oz)        Intake/Output Summary (Last 24 hours) at 07/09/13 4128 Last data filed at 07/09/13 7867  Gross per 24 hour  Intake    480 ml  Output   4725 ml  Net  -4245 ml     Assessment/Plan: Fluid overload most likely due to heart failure: - Cont to have Good UOP overnight. - Fluid restriction, strict I and O's. Daily weights. - monitor electrolytes. - start ACE-I and BB. Echo pending.   Elevated blood pressure - diuretic, ACE-I and beta-blocker.   Cirrhosis: - By [place]. - ? Most likely due to to fatty liver. No history of ETOH.  Code Status: full Family Communication: friend  Disposition Plan: inpatient   Consultants:  none  Procedures: ECHO: pending  Antibiotics:  None  HPI/Subjective: No complains  Objective: Filed Vitals:   07/08/13 2013 07/09/13 0152 07/09/13 0500 07/09/13 0542  BP: 161/98 137/76  144/86  Pulse: 101 98  91  Temp: 97.7 F (36.5 C) 98.8 F (37.1 C)  97.7 F (36.5 C)  TempSrc: Oral Oral  Oral  Resp: 18 18  20   Height:      Weight:   156.5 kg (345 lb 0.3 oz)   SpO2: 93% 96%  94%     Exam:  General: Alert, awake, oriented x3, in no acute distress. Morbidly obese, anasarca. HEENT: No bruits, no goiter.  Heart: Regular rate and rhythm, without murmurs, rubs, gallops.  Lungs: Good air movement, clear Abdomen: Soft, nontender, nondistended, positive bowel sounds.     Data Reviewed: Basic Metabolic Panel:  Recent Labs Lab 07/07/13 1738 07/08/13 0358  07/09/13 0515  NA 137 143 140  K 3.8 3.8 3.7  CL 98 99 97  CO2 24 29 31   GLUCOSE 93 79 93  BUN 13 13 12   CREATININE 1.05 1.04 1.08  CALCIUM 10.1 10.2 9.5   Liver Function Tests:  Recent Labs Lab 07/07/13 1738  AST 25  ALT 19  ALKPHOS 107  BILITOT 1.3*  PROT 7.1  ALBUMIN 3.3*   No results found for this basename: LIPASE, AMYLASE,  in the last 168 hours No results found for this basename: AMMONIA,  in the last 168 hours CBC:  Recent Labs Lab 07/07/13 1738 07/08/13 0358  WBC 9.2 9.8  HGB 14.1 13.3  HCT 43.8 41.8  MCV 88.7 88.6  PLT 312 346   Cardiac Enzymes: No results found for this basename: CKTOTAL, CKMB, CKMBINDEX, TROPONINI,  in the last 168 hours BNP (last 3 results)  Recent Labs  07/07/13 1738  PROBNP 3249.0*   CBG: No results found for this basename: GLUCAP,  in the last 168 hours  No results found for this or any previous visit (from the past 240 hour(s)).   Studies: Dg Chest 2 View  07/07/2013   CLINICAL DATA:  Difficulty breathing. No history of smoking. Chest pain.  EXAM: CHEST  2 VIEW  COMPARISON:  None.  FINDINGS: The heart is enlarged. The mediastinal contours are normal.  There is interstitial prominence throughout both lungs without abnormality of the pulmonary vasculature. There is no confluent airspace opacity or significant pleural effusion. The osseous structures appear normal.  IMPRESSION: Cardiomegaly with generalized interstitial prominence. No definite edema or focal airspace disease.   Electronically Signed   By: Camie Patience M.D.   On: 07/07/2013 19:55   Dg Abd 1 View  07/07/2013   CLINICAL DATA:  Difficulty breathing when lying supine.  Nonsmoker.  EXAM: ABDOMEN - 1 VIEW  COMPARISON:  None.  FINDINGS: Single erect view of the abdomen demonstrates no evidence of pneumoperitoneum. The visualized bowel gas pattern is normal. The pelvis is not imaged. No suspicious calcifications are seen.  IMPRESSION: No evidence of pneumoperitoneum. The  visualized abdominal contents appear unremarkable.   Electronically Signed   By: Camie Patience M.D.   On: 07/07/2013 19:56   Ct Angio Chest Pe W/cm &/or Wo Cm  07/07/2013   CLINICAL DATA:  SOB, bilateral edema  EXAM: CT ANGIOGRAPHY CHEST WITH CONTRAST  TECHNIQUE: Multidetector CT imaging of the chest was performed using the standard protocol during bolus administration of intravenous contrast. Multiplanar CT image reconstructions and MIPs were obtained to evaluate the vascular anatomy.  CONTRAST:  29mL OMNIPAQUE IOHEXOL 350 MG/ML SOLN  COMPARISON:  None.  FINDINGS: Satisfactory opacification of pulmonary arteries noted, and there is no evidence of pulmonary emboli. Patient breathing during the acquisition degrades some of the images. There is four-chamber cardiac enlargement. Thoracic aorta is nondilated, incompletely opacified. Small bilateral pleural effusions. No pericardial effusion. Subcentimeter prevascular, AP window, precarinal, and right paratracheal lymph nodes. No definite hilar adenopathy. Extensive ground-glass opacities throughout both lungs involving the upper lobes more than bases.  There is a small amount of perihepatic and perisplenic ascites. Remainder visualized upper abdomen unremarkable. Small spurs at multiple contiguous levels in the thoracic spine.  Review of the MIP images confirms the above findings.  IMPRESSION: 1. No large or central pulmonary emboli. 2. Extensive ground-glass opacities throughout both lungs, favor edema over alveolitis. 3. Perihepatic and perisplenic ascites.   Electronically Signed   By: Arne Cleveland M.D.   On: 07/07/2013 20:20   US Abdomen Complete  07/08/2013   CLINICAL DATA:  Increasing abdominal girth  EXAM: ULTRASOUND ABDOMEN COMPLETE  COMPARISON:  CT scan of the chest including the upper abdomen 07/07/2013  FINDINGS: Gallbladder:  No gallstones or wall thickening visualized. No sonographic Murphy sign noted.  Common bile duct:  Diameter: Within normal  limits at 5.7 mm  Liver:  No focal lesion identified. Dense and heterogeneous echotexture of the hepatic parenchyma. The main portal vein is patent with normal hepatopetal flow.  IVC:  No abnormality visualized.  Pancreas:  Visualized portion unremarkable.  Spleen:  Size and appearance within normal limits.  Right Kidney:  Length: 11.5 cm. Echogenicity within normal limits. No mass or hydronephrosis visualized.  Left Kidney:  Length: 11.5 cm. Echogenicity within normal limits. No mass or hydronephrosis visualized.  Abdominal aorta:  Partially obscured by overlying bowel gas.  No aneurysm identified.  Other findings:  Small volume ascites.  IMPRESSION: 1. No acute abnormality in the abdomen or pelvis. 2. Nonspecific dense and heterogeneous echotexture of the hepatic parenchyma. This can be seen in the setting of cirrhosis, and also with hepatic steatosis. 3. Small volume of peritoneal ascites.   Electronically Signed   By: Jacqulynn Cadet M.D.   On: 07/08/2013 16:18    Scheduled Meds: . aspirin EC  81 mg Oral Daily  .  carvedilol  3.125 mg Oral BID WC  . enoxaparin (LOVENOX) injection  40 mg Subcutaneous Q24H  . lisinopril  5 mg Oral Daily  . potassium chloride  20 mEq Oral Daily  . sodium chloride  3 mL Intravenous Q12H  . sodium chloride  3 mL Intravenous Q12H   Continuous Infusions: . furosemide (LASIX) infusion 8 mg/hr (07/08/13 0956)     [place] [facility] Pager [phone] If 8PM-8AM, please contact night-coverage at [email or web address], password [facility] 07/09/2013, 7:33 AM  LOS: 2 days

## 2013-07-10 DIAGNOSIS — I5021 Acute systolic (congestive) heart failure: Secondary | ICD-10-CM

## 2013-07-10 DIAGNOSIS — I10 Essential (primary) hypertension: Secondary | ICD-10-CM

## 2013-07-10 DIAGNOSIS — I509 Heart failure, unspecified: Secondary | ICD-10-CM

## 2013-07-10 DIAGNOSIS — I119 Hypertensive heart disease without heart failure: Secondary | ICD-10-CM | POA: Diagnosis present

## 2013-07-10 LAB — BASIC METABOLIC PANEL
BUN: 16 mg/dL (ref 6–23)
CO2: 31 mEq/L (ref 19–32)
Calcium: 9.7 mg/dL (ref 8.4–10.5)
Chloride: 98 mEq/L (ref 96–112)
Creatinine, Ser: 1.34 mg/dL — ABNORMAL HIGH (ref 0.50–1.10)
GFR calc Af Amer: 49 mL/min — ABNORMAL LOW (ref >90)
GFR calc non Af Amer: 42 mL/min — ABNORMAL LOW (ref >90)
Glucose, Bld: 89 mg/dL (ref 70–99)
Potassium: 3.5 mEq/L — ABNORMAL LOW (ref 3.7–5.3)
Sodium: 144 mEq/L (ref 137–147)

## 2013-07-10 MED ORDER — POTASSIUM CHLORIDE CRYS ER 20 MEQ PO TBCR
20.0000 meq | EXTENDED_RELEASE_TABLET | Freq: Every day | ORAL | Status: DC
  Administered 2013-07-10: 20 meq via ORAL

## 2013-07-10 MED ORDER — PERFLUTREN LIPID MICROSPHERE
1.0000 mL | INTRAVENOUS | Status: AC | PRN
  Administered 2013-07-10 (×2): 2 mL via INTRAVENOUS
  Filled 2013-07-10: qty 10

## 2013-07-10 MED ORDER — SPIRONOLACTONE 12.5 MG HALF TABLET
12.5000 mg | ORAL_TABLET | Freq: Every day | ORAL | Status: DC
  Administered 2013-07-10 – 2013-07-13 (×4): 12.5 mg via ORAL
  Filled 2013-07-10 (×5): qty 1

## 2013-07-10 NOTE — Progress Notes (Signed)
Nutrition Education Note  RD consulted for nutrition education regarding new onset CHF.  RD provided "Low Sodium Nutrition Therapy" handout from the Academy of [employer] and Dietetics. Reviewed patient's dietary recall. Provided examples on ways to decrease sodium intake in diet. Discouraged intake of processed foods and use of salt shaker. Encouraged fresh fruits and vegetables as well as whole grain sources of carbohydrates to maximize fiber intake.   RD discussed why it is important for patient to adhere to diet recommendations, and emphasized the role of fluids, foods to avoid, and importance of weighing self daily. Teach back method used. 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   Expect good compliance.  Body mass index is 58.92 kg/(m^2). Pt meets criteria for class III extreme obesity based on current BMI.  Current diet order is heart healthy, patient is consuming approximately 100% of meals at this time. Labs and medications reviewed. No further nutrition interventions warranted at this time. RD contact information provided. If additional nutrition issues arise, please re-consult RD.   Mikey College MS, RD, LDN [phone] Weekend/After Hours Pager

## 2013-07-10 NOTE — Progress Notes (Signed)
Echocardiogram 2D Echocardiogram limited with Definity has been performed.  Phyllis Todd 07/10/2013, 11:25 AM

## 2013-07-10 NOTE — Consult Note (Signed)
CARDIOLOGY CONSULT NOTE   Patient ID: Phyllis Todd MRN: [id number], DOB/AGE: 61-30-1952   Admit date: 07/07/2013 Date of Consult: 07/10/2013   Primary Physician: PROVIDER NOT IN SYSTEM Primary Cardiologist: None  Pt. Profile  64 year old divorced woman with no regular medical care presents with gradual worsening dyspnea and fluid retention over the past 2 months.  Problem List  Past Medical History  Diagnosis Date  . Medical history non-contributory     Past Surgical History  Procedure Laterality Date  . Cesarean section    . Tonsillectomy       Allergies  Allergies  Allergen Reactions  . Codeine Other (See Comments)    "makes me hyper"  . Demerol [Meperidine] Other (See Comments)    "sick"  . Peanut-Containing Drug Products Hives and Swelling    "almonds"    Social history reveals that she is a nonsmoker.  She rarely drinks alcohol.  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 HPI   This pleasant 63 year old woman has no known prior cardiac history.  She has been experiencing increased shortness of breath over the past several months.  She has had exertional dyspnea as well as orthopnea.  She has had severe peripheral edema.  Initially the edema would resolve overnight but more recently the edema has persisted.  She denies any chest pain.  She denies any history of high blood pressure or diabetes.  Her echocardiogram shows an ejection fraction of 35-40%.  A repeat study with Definity did not reveal any definite wall motion abnormality but the study was still difficult because of her body habitus and wall motion abnormalities cannot be excluded.  Inpatient Medications  . aspirin EC  81 mg Oral Daily  . carvedilol  6.25 mg Oral BID WC  . enoxaparin (LOVENOX) injection  40 mg Subcutaneous Q24H  . lisinopril  5 mg Oral Daily  . spironolactone  12.5 mg Oral Daily    Family History Family History  Problem Relation Age of Onset   . Stroke Mother   . Hypertension Mother   . CAD Father   . Hypertension Father      Social History History   Social History  . Marital Status: Divorced    Spouse Name: N/A    Number of Children: N/A  . Years of Education: N/A   Occupational History  . Not on file.   Social History Main Topics  . Smoking status: Never Smoker   . Smokeless tobacco: Not on file  . Alcohol Use: No  . Drug Use: No  . Sexual Activity: Not on file   Other Topics Concern  . Not on file   Social History Narrative  . No narrative on file     Review of Systems  General:  No chills, fever, night sweats or weight changes.  No history of thyroid abnormality Cardiovascular:  No chest pain, dyspnea on exertion, edema, orthopnea, palpitations, paroxysmal nocturnal dyspnea. Dermatological: No rash, lesions/masses Respiratory: No cough, dyspnea Urologic: No hematuria, dysuria Abdominal:   No nausea, vomiting, diarrhea, bright red blood per rectum, melena, or hematemesis Neurologic:  No visual changes, wkns, changes in mental status. All other systems reviewed and are otherwise negative except as noted above.  Physical Exam  Blood pressure 151/93, pulse 85, temperature 98.1 F (36.7 C), temperature source Oral, resp. rate 18, height 5\' 4"  (1.626 m), weight 343 lb 6.4 oz (155.765 kg), SpO2 95.00%.  General: Pleasant, NAD.  Morbidly  obese Psych: Normal affect. Neuro: Alert and oriented X 3. Moves all extremities spontaneously. HEENT: Normal  Neck: Supple without bruits or JVD. Lungs:  Resp regular and unlabored, CTA. Heart: RRR no s3, s4, or murmurs. Abdomen: Soft, non-tender, non-distended, BS + x 4.  Extremities: Chronic stasis dermatitis and indurated peripheral edema. Labs  No results found for this basename: CKTOTAL, CKMB, TROPONINI,  in the last 72 hours Lab Results  Component Value Date   WBC 9.8 07/08/2013   HGB 13.3 07/08/2013   HCT 41.8 07/08/2013   MCV 88.6 07/08/2013   PLT 346  07/08/2013    Recent Labs Lab 07/07/13 1738  07/10/13 0305  NA 137  < > 144  K 3.8  < > 3.5*  CL 98  < > 98  CO2 24  < > 31  BUN 13  < > 16  CREATININE 1.05  < > 1.34*  CALCIUM 10.1  < > 9.7  PROT 7.1  --   --   BILITOT 1.3*  --   --   ALKPHOS 107  --   --   ALT 19  --   --   AST 25  --   --   GLUCOSE 93  < > 89  < > = values in this interval not displayed. No results found for this basename: CHOL,  HDL,  LDLCALC,  TRIG   Lab Results  Component Value Date   DDIMER 1.52* 07/07/2013    Radiology/Studies  Dg Chest 2 View  07/07/2013   CLINICAL DATA:  Difficulty breathing. No history of smoking. Chest pain.  EXAM: CHEST  2 VIEW  COMPARISON:  None.  FINDINGS: The heart is enlarged. The mediastinal contours are normal. There is interstitial prominence throughout both lungs without abnormality of the pulmonary vasculature. There is no confluent airspace opacity or significant pleural effusion. The osseous structures appear normal.  IMPRESSION: Cardiomegaly with generalized interstitial prominence. No definite edema or focal airspace disease.   Electronically Signed   By: Camie Patience M.D.   On: 07/07/2013 19:55   Dg Abd 1 View  07/07/2013   CLINICAL DATA:  Difficulty breathing when lying supine.  Nonsmoker.  EXAM: ABDOMEN - 1 VIEW  COMPARISON:  None.  FINDINGS: Single erect view of the abdomen demonstrates no evidence of pneumoperitoneum. The visualized bowel gas pattern is normal. The pelvis is not imaged. No suspicious calcifications are seen.  IMPRESSION: No evidence of pneumoperitoneum. The visualized abdominal contents appear unremarkable.   Electronically Signed   By: Camie Patience M.D.   On: 07/07/2013 19:56   Ct Angio Chest Pe W/cm &/or Wo Cm  07/07/2013   CLINICAL DATA:  SOB, bilateral edema  EXAM: CT ANGIOGRAPHY CHEST WITH CONTRAST  TECHNIQUE: Multidetector CT imaging of the chest was performed using the standard protocol during bolus administration of intravenous contrast.  Multiplanar CT image reconstructions and MIPs were obtained to evaluate the vascular anatomy.  CONTRAST:  31mL OMNIPAQUE IOHEXOL 350 MG/ML SOLN  COMPARISON:  None.  FINDINGS: Satisfactory opacification of pulmonary arteries noted, and there is no evidence of pulmonary emboli. Patient breathing during the acquisition degrades some of the images. There is four-chamber cardiac enlargement. Thoracic aorta is nondilated, incompletely opacified. Small bilateral pleural effusions. No pericardial effusion. Subcentimeter prevascular, AP window, precarinal, and right paratracheal lymph nodes. No definite hilar adenopathy. Extensive ground-glass opacities throughout both lungs involving the upper lobes more than bases.  There is a small amount of perihepatic and perisplenic ascites. Remainder visualized  upper abdomen unremarkable. Small spurs at multiple contiguous levels in the thoracic spine.  Review of the MIP images confirms the above findings.  IMPRESSION: 1. No large or central pulmonary emboli. 2. Extensive ground-glass opacities throughout both lungs, favor edema over alveolitis. 3. Perihepatic and perisplenic ascites.   Electronically Signed   By: Arne Cleveland M.D.   On: 07/07/2013 20:20   US Abdomen Complete  07/08/2013   CLINICAL DATA:  Increasing abdominal girth  EXAM: ULTRASOUND ABDOMEN COMPLETE  COMPARISON:  CT scan of the chest including the upper abdomen 07/07/2013  FINDINGS: Gallbladder:  No gallstones or wall thickening visualized. No sonographic Murphy sign noted.  Common bile duct:  Diameter: Within normal limits at 5.7 mm  Liver:  No focal lesion identified. Dense and heterogeneous echotexture of the hepatic parenchyma. The main portal vein is patent with normal hepatopetal flow.  IVC:  No abnormality visualized.  Pancreas:  Visualized portion unremarkable.  Spleen:  Size and appearance within normal limits.  Right Kidney:  Length: 11.5 cm. Echogenicity within normal limits. No mass or hydronephrosis  visualized.  Left Kidney:  Length: 11.5 cm. Echogenicity within normal limits. No mass or hydronephrosis visualized.  Abdominal aorta:  Partially obscured by overlying bowel gas.  No aneurysm identified.  Other findings:  Small volume ascites.  IMPRESSION: 1. No acute abnormality in the abdomen or pelvis. 2. Nonspecific dense and heterogeneous echotexture of the hepatic parenchyma. This can be seen in the setting of cirrhosis, and also with hepatic steatosis. 3. Small volume of peritoneal ascites.   Electronically Signed   By: Jacqulynn Cadet M.D.   On: 07/08/2013 16:18    ECG  Sinus tachycardia Right axis deviation Low voltage, precordial leads Probable anteroseptal infarct, old Minimal ST depression, lateral leads  2-D echo:  - Left ventricle: The endocardial segments are not well visualized making assessment of accurate EF difficult. Suggest repeat limited echo with definity contrast. The cavity size was normal. Systolic function was moderately reduced. The estimated ejection fraction was in the range of 35% to 40%. Images were inadequate for LV wall motion assessment. - Mitral valve: There was trivial regurgitation. - Left atrium: The atrium was moderately dilated. - Right ventricle: Systolic function was moderately reduced.  ASSESSMENT AND PLAN  1.   Congestive heart failure secondary to left ventricular systolic dysfunction of uncertain etiology.  Rule out ischemic heart disease.  Because of her body habitus she is not a candidate for nuclear stress test. 2. morbid obesity 3. hypertension  Recommendation: I have recommended to the patient that we proceed with left heart cardiac catheterization.  This will be done on Monday, June 1.  We will watch her renal function closely over the weekend and avoid overdiuresis.  The patient is agreeable with this plan.  Wynonia Musty Illona Bulman,MD  07/10/2013, 4:04 PM

## 2013-07-10 NOTE — Plan of Care (Signed)
Problem: Phase I Progression Outcomes Goal: EF % per last Echo/documented,Core Reminder form on chart Outcome: Progressing Repeat Echo completed with contrast as ordered.

## 2013-07-10 NOTE — Progress Notes (Signed)
[facility] PROGRESS NOTE Interim History: 63 y.o. female with no significant past medical history has been experiencing increasing shortness of breath over the last 2 months with increasing abdominal girth and lower extremity edema. Patient has been sleeping propped up over the last 2 months. Denies any chest pain fever chills.  Filed Weights   07/08/13 0519 07/09/13 0500 07/10/13 0457  Weight: 161.208 kg (355 lb 6.4 oz) 156.5 kg (345 lb 0.3 oz) 155.765 kg (343 lb 6.4 oz)        Intake/Output Summary (Last 24 hours) at 07/10/13 0746 Last data filed at 07/10/13 0504  Gross per 24 hour  Intake    723 ml  Output   3700 ml  Net  -2977 ml     Assessment/Plan: Acute systolic heart failure: - Cont to have Good UOP overnight. Mild increase in Cr hold lasix for 24 hrs add aldactone - Fluid restriction, strict I and O's. Daily weights. - Cont ACE-I and BB. Echo hard to evaluate EF repeat with contrast.  Hypertensive heart disease: - On admission significantly high. - Started on lasix, ACE-I and beta-blocker with improvement in BP. - start low dose aldactone.   Cirrhosis: - By [place]. - ? Most likely due to to fatty liver. No history of ETOH.  Code Status: full Family Communication: friend  Disposition Plan: inpatient   Consultants:  none  Procedures: ECHO: pending  Antibiotics:  None  HPI/Subjective: No complains  Objective: Filed Vitals:   07/09/13 0542 07/09/13 1403 07/09/13 2021 07/10/13 0457  BP: 144/86 133/76 150/94 131/80  Pulse: 91 91 92 93  Temp: 97.7 F (36.5 C) 97.8 F (36.6 C) 97.5 F (36.4 C) 97.7 F (36.5 C)  TempSrc: Oral Oral Oral Oral  Resp: 20 18 20 20   Height:      Weight:    155.765 kg (343 lb 6.4 oz)  SpO2: 94% 93% 95% 94%     Exam:  General: Alert, awake, oriented x3, in no acute distress. Morbidly obese, anasarca. HEENT: No bruits, no goiter.  Heart: Regular rate and rhythm, without murmurs, rubs, gallops.  Lungs: Good  air movement, clear Abdomen: Soft, nontender, nondistended, positive bowel sounds.     Data Reviewed: Basic Metabolic Panel:  Recent Labs Lab 07/07/13 1738 07/08/13 0358 07/09/13 0515 07/10/13 0305  NA 137 143 140 144  K 3.8 3.8 3.7 3.5*  CL 98 99 97 98  CO2 24 29 31 31   GLUCOSE 93 79 93 89  BUN 13 13 12 16   CREATININE 1.05 1.04 1.08 1.34*  CALCIUM 10.1 10.2 9.5 9.7   Liver Function Tests:  Recent Labs Lab 07/07/13 1738  AST 25  ALT 19  ALKPHOS 107  BILITOT 1.3*  PROT 7.1  ALBUMIN 3.3*   No results found for this basename: LIPASE, AMYLASE,  in the last 168 hours No results found for this basename: AMMONIA,  in the last 168 hours CBC:  Recent Labs Lab 07/07/13 1738 07/08/13 0358  WBC 9.2 9.8  HGB 14.1 13.3  HCT 43.8 41.8  MCV 88.7 88.6  PLT 312 346   Cardiac Enzymes: No results found for this basename: CKTOTAL, CKMB, CKMBINDEX, TROPONINI,  in the last 168 hours BNP (last 3 results)  Recent Labs  07/07/13 1738  PROBNP 3249.0*   CBG: No results found for this basename: GLUCAP,  in the last 168 hours  No results found for this or any previous visit (from the past 240 hour(s)).   Studies: US Abdomen  Complete  07/08/2013   CLINICAL DATA:  Increasing abdominal girth  EXAM: ULTRASOUND ABDOMEN COMPLETE  COMPARISON:  CT scan of the chest including the upper abdomen 07/07/2013  FINDINGS: Gallbladder:  No gallstones or wall thickening visualized. No sonographic Murphy sign noted.  Common bile duct:  Diameter: Within normal limits at 5.7 mm  Liver:  No focal lesion identified. Dense and heterogeneous echotexture of the hepatic parenchyma. The main portal vein is patent with normal hepatopetal flow.  IVC:  No abnormality visualized.  Pancreas:  Visualized portion unremarkable.  Spleen:  Size and appearance within normal limits.  Right Kidney:  Length: 11.5 cm. Echogenicity within normal limits. No mass or hydronephrosis visualized.  Left Kidney:  Length: 11.5 cm.  Echogenicity within normal limits. No mass or hydronephrosis visualized.  Abdominal aorta:  Partially obscured by overlying bowel gas.  No aneurysm identified.  Other findings:  Small volume ascites.  IMPRESSION: 1. No acute abnormality in the abdomen or pelvis. 2. Nonspecific dense and heterogeneous echotexture of the hepatic parenchyma. This can be seen in the setting of cirrhosis, and also with hepatic steatosis. 3. Small volume of peritoneal ascites.   Electronically Signed   By: Jacqulynn Cadet M.D.   On: 07/08/2013 16:18    Scheduled Meds: . aspirin EC  81 mg Oral Daily  . carvedilol  6.25 mg Oral BID WC  . enoxaparin (LOVENOX) injection  40 mg Subcutaneous Q24H  . lisinopril  5 mg Oral Daily  . potassium chloride  20 mEq Oral Daily  . sodium chloride  3 mL Intravenous Q12H  . sodium chloride  3 mL Intravenous Q12H   Continuous Infusions:     [facility] Pager [phone] If 8PM-8AM, please contact night-coverage at [email or web address], password [facility] 07/10/2013, 7:46 AM  LOS: 3 days

## 2013-07-11 LAB — BASIC METABOLIC PANEL
BUN: 22 mg/dL (ref 6–23)
CO2: 32 mEq/L (ref 19–32)
Calcium: 9.8 mg/dL (ref 8.4–10.5)
Chloride: 95 mEq/L — ABNORMAL LOW (ref 96–112)
Creatinine, Ser: 1.35 mg/dL — ABNORMAL HIGH (ref 0.50–1.10)
GFR calc Af Amer: 48 mL/min — ABNORMAL LOW (ref >90)
GFR calc non Af Amer: 42 mL/min — ABNORMAL LOW (ref >90)
Glucose, Bld: 92 mg/dL (ref 70–99)
Potassium: 3.6 mEq/L — ABNORMAL LOW (ref 3.7–5.3)
Sodium: 139 mEq/L (ref 137–147)

## 2013-07-11 MED ORDER — SODIUM CHLORIDE 0.9 % IV SOLN
INTRAVENOUS | Status: DC
  Administered 2013-07-12: 05:00:00 via INTRAVENOUS

## 2013-07-11 MED ORDER — ASPIRIN EC 81 MG PO TBEC
81.0000 mg | DELAYED_RELEASE_TABLET | Freq: Every day | ORAL | Status: DC
  Administered 2013-07-13: 81 mg via ORAL
  Filled 2013-07-11: qty 1

## 2013-07-11 MED ORDER — CARVEDILOL 12.5 MG PO TABS
12.5000 mg | ORAL_TABLET | Freq: Two times a day (BID) | ORAL | Status: DC
  Administered 2013-07-11 – 2013-07-13 (×4): 12.5 mg via ORAL
  Filled 2013-07-11 (×6): qty 1

## 2013-07-11 MED ORDER — FUROSEMIDE 20 MG PO TABS
20.0000 mg | ORAL_TABLET | Freq: Every day | ORAL | Status: DC
  Filled 2013-07-11: qty 1

## 2013-07-11 MED ORDER — SODIUM CHLORIDE 0.9 % IV SOLN: 250.0000 mL | INTRAVENOUS | Status: DC | PRN

## 2013-07-11 MED ORDER — SODIUM CHLORIDE 0.9 % IJ SOLN
3.0000 mL | Freq: Two times a day (BID) | INTRAMUSCULAR | Status: DC
  Administered 2013-07-11: 3 mL via INTRAVENOUS

## 2013-07-11 MED ORDER — POTASSIUM CHLORIDE CRYS ER 20 MEQ PO TBCR
20.0000 meq | EXTENDED_RELEASE_TABLET | Freq: Every day | ORAL | Status: DC
Start: 2013-07-11 — End: 2013-07-13
  Administered 2013-07-11 – 2013-07-13 (×3): 20 meq via ORAL
  Filled 2013-07-11 (×3): qty 1

## 2013-07-11 MED ORDER — SODIUM CHLORIDE 0.9 % IJ SOLN: 3.0000 mL | INTRAMUSCULAR | Status: DC | PRN

## 2013-07-11 MED ORDER — ASPIRIN 81 MG PO CHEW
81.0000 mg | CHEWABLE_TABLET | ORAL | Status: AC
  Administered 2013-07-12: 81 mg via ORAL
  Filled 2013-07-11: qty 1

## 2013-07-11 NOTE — Progress Notes (Signed)
[facility] PROGRESS NOTE Interim History: 63 y.o. female with no significant past medical history has been experiencing increasing shortness of breath over the last 2 months with increasing abdominal girth and lower extremity edema. Patient has been sleeping propped up over the last 2 months. Denies any chest pain fever chills.  Filed Weights   07/09/13 0500 07/10/13 0457 07/11/13 0416  Weight: 156.5 kg (345 lb 0.3 oz) 155.765 kg (343 lb 6.4 oz) 156.99 kg (346 lb 1.6 oz)        Intake/Output Summary (Last 24 hours) at 07/11/13 0816 Last data filed at 07/11/13 0700  Gross per 24 hour  Intake    240 ml  Output   1251 ml  Net  -1011 ml     Assessment/Plan: Acute systolic heart failure: - Cont to have Good UOP overnight. Mild increase in Cr held lasix. - Fluid restriction, strict I and O's. Daily weights. - Cont ACE-I and BB. Echo hard to evaluate EF repeat with contrast pending. - Resume lasix in am and cont aldactone.  Hypertensive heart disease: - On admission significantly high. - Started on lasix, ACE-I and beta-blocker with improvement in BP. - Cont low dose aldactone.   Cirrhosis: - By [place]. - ? Most likely due to to fatty liver. No history of ETOH.  Code Status: full Family Communication: friend  Disposition Plan: inpatient   Consultants:  none  Procedures: ECHO: pending  Antibiotics:  None  HPI/Subjective: No complains  Objective: Filed Vitals:   07/10/13 0959 07/10/13 1339 07/10/13 2007 07/11/13 0416  BP: 120/68 151/93 143/88 148/78  Pulse: 78 85 85 81  Temp:  98.1 F (36.7 C) 97.9 F (36.6 C) 97.5 F (36.4 C)  TempSrc:  Oral Oral Oral  Resp: 18 18 18 18   Height:      Weight:    156.99 kg (346 lb 1.6 oz)  SpO2:  95% 95% 99%     Exam:  General: Alert, awake, oriented x3, in no acute distress. Morbidly obese, anasarca. HEENT: No bruits, no goiter.  Heart: Regular rate and rhythm, without murmurs, rubs, gallops.  Lungs: Good air  movement, clear Abdomen: Soft, nontender, nondistended, positive bowel sounds.     Data Reviewed: Basic Metabolic Panel:  Recent Labs Lab 07/07/13 1738 07/08/13 0358 07/09/13 0515 07/10/13 0305 07/11/13 0350  NA 137 143 140 144 139  K 3.8 3.8 3.7 3.5* 3.6*  CL 98 99 97 98 95*  CO2 24 29 31 31  32  GLUCOSE 93 79 93 89 92  BUN 13 13 12 16 22   CREATININE 1.05 1.04 1.08 1.34* 1.35*  CALCIUM 10.1 10.2 9.5 9.7 9.8   Liver Function Tests:  Recent Labs Lab 07/07/13 1738  AST 25  ALT 19  ALKPHOS 107  BILITOT 1.3*  PROT 7.1  ALBUMIN 3.3*   No results found for this basename: LIPASE, AMYLASE,  in the last 168 hours No results found for this basename: AMMONIA,  in the last 168 hours CBC:  Recent Labs Lab 07/07/13 1738 07/08/13 0358  WBC 9.2 9.8  HGB 14.1 13.3  HCT 43.8 41.8  MCV 88.7 88.6  PLT 312 346   Cardiac Enzymes: No results found for this basename: CKTOTAL, CKMB, CKMBINDEX, TROPONINI,  in the last 168 hours BNP (last 3 results)  Recent Labs  07/07/13 1738  PROBNP 3249.0*   CBG: No results found for this basename: GLUCAP,  in the last 168 hours  No results found for this or any previous  visit (from the past 240 hour(s)).   Studies: No results found.  Scheduled Meds: . aspirin EC  81 mg Oral Daily  . carvedilol  6.25 mg Oral BID WC  . enoxaparin (LOVENOX) injection  40 mg Subcutaneous Q24H  . lisinopril  5 mg Oral Daily  . spironolactone  12.5 mg Oral Daily   Continuous Infusions:     Charlynne Cousins  [facility] Pager [phone] If 8PM-8AM, please contact night-coverage at [email or web address], password [facility] 07/11/2013, 8:16 AM  LOS: 4 days

## 2013-07-11 NOTE — Plan of Care (Signed)
Problem: Consults Goal: Heart Failure Patient Education (See Patient Education module for education specifics.)  Outcome: Progressing Heart Failure booklet read by the client

## 2013-07-11 NOTE — Progress Notes (Signed)
Patient Name: Phyllis Todd Date of Encounter: 07/11/2013     Principal Problem:   Fluid overload Active Problems:   Acute systolic congestive heart failure, NYHA class 2   Hypertensive heart disease    SUBJECTIVE  No dyspnea at rest.. no chest pain.  Rhythm regular on bedside exam.  CURRENT MEDS . aspirin EC  81 mg Oral Daily  . carvedilol  6.25 mg Oral BID WC  . enoxaparin (LOVENOX) injection  40 mg Subcutaneous Q24H  . lisinopril  5 mg Oral Daily  . spironolactone  12.5 mg Oral Daily    OBJECTIVE  Filed Vitals:   07/10/13 0959 07/10/13 1339 07/10/13 2007 07/11/13 0416  BP: 120/68 151/93 143/88 148/78  Pulse: 78 85 85 81  Temp:  98.1 F (36.7 C) 97.9 F (36.6 C) 97.5 F (36.4 C)  TempSrc:  Oral Oral Oral  Resp: 18 18 18 18   Height:      Weight:    346 lb 1.6 oz (156.99 kg)  SpO2:  95% 95% 99%    Intake/Output Summary (Last 24 hours) at 07/11/13 0807 Last data filed at 07/11/13 0700  Gross per 24 hour  Intake    240 ml  Output   1251 ml  Net  -1011 ml   Filed Weights   07/09/13 0500 07/10/13 0457 07/11/13 0416  Weight: 345 lb 0.3 oz (156.5 kg) 343 lb 6.4 oz (155.765 kg) 346 lb 1.6 oz (156.99 kg)    PHYSICAL EXAM  General: Pleasant, NAD. Morbidly obese  Psych: Normal affect.  Neuro: Alert and oriented X 3. Moves all extremities spontaneously.  HEENT: Normal  Neck: Supple without bruits or JVD.  Lungs: Resp regular and unlabored, CTA.  Heart: RRR no s3, s4, or murmurs.  Abdomen: Soft, non-tender, non-distended, BS + x 4.  Extremities: Chronic stasis dermatitis and indurated peripheral edema.  Accessory Clinical Findings  CBC No results found for this basename: WBC, NEUTROABS, HGB, HCT, MCV, PLT,  in the last 72 hours Basic Metabolic Panel  Recent Labs  07/10/13 0305 07/11/13 0350  NA 144 139  K 3.5* 3.6*  CL 98 95*  CO2 31 32  GLUCOSE 89 92  BUN 16 22  CREATININE 1.34* 1.35*  CALCIUM 9.7 9.8   Liver Function Tests No results found  for this basename: AST, ALT, ALKPHOS, BILITOT, PROT, ALBUMIN,  in the last 72 hours No results found for this basename: LIPASE, AMYLASE,  in the last 72 hours Cardiac Enzymes No results found for this basename: CKTOTAL, CKMB, CKMBINDEX, TROPONINI,  in the last 72 hours BNP No components found with this basename: POCBNP,  D-Dimer No results found for this basename: DDIMER,  in the last 72 hours Hemoglobin A1C No results found for this basename: HGBA1C,  in the last 72 hours Fasting Lipid Panel No results found for this basename: CHOL, HDL, LDLCALC, TRIG, CHOLHDL, LDLDIRECT,  in the last 72 hours Thyroid Function Tests No results found for this basename: TSH, T4TOTAL, FREET3, T3FREE, THYROIDAB,  in the last 72 hours  TELE  Not on telemetry  ECG    Radiology/Studies  Dg Chest 2 View  07/07/2013   CLINICAL DATA:  Difficulty breathing. No history of smoking. Chest pain.  EXAM: CHEST  2 VIEW  COMPARISON:  None.  FINDINGS: The heart is enlarged. The mediastinal contours are normal. There is interstitial prominence throughout both lungs without abnormality of the pulmonary vasculature. There is no confluent airspace opacity or significant pleural effusion. The osseous  structures appear normal.  IMPRESSION: Cardiomegaly with generalized interstitial prominence. No definite edema or focal airspace disease.   Electronically Signed   By: Camie Patience M.D.   On: 07/07/2013 19:55   Dg Abd 1 View  07/07/2013   CLINICAL DATA:  Difficulty breathing when lying supine.  Nonsmoker.  EXAM: ABDOMEN - 1 VIEW  COMPARISON:  None.  FINDINGS: Single erect view of the abdomen demonstrates no evidence of pneumoperitoneum. The visualized bowel gas pattern is normal. The pelvis is not imaged. No suspicious calcifications are seen.  IMPRESSION: No evidence of pneumoperitoneum. The visualized abdominal contents appear unremarkable.   Electronically Signed   By: Camie Patience M.D.   On: 07/07/2013 19:56   Ct Angio Chest  Pe W/cm &/or Wo Cm  07/07/2013   CLINICAL DATA:  SOB, bilateral edema  EXAM: CT ANGIOGRAPHY CHEST WITH CONTRAST  TECHNIQUE: Multidetector CT imaging of the chest was performed using the standard protocol during bolus administration of intravenous contrast. Multiplanar CT image reconstructions and MIPs were obtained to evaluate the vascular anatomy.  CONTRAST:  81mL OMNIPAQUE IOHEXOL 350 MG/ML SOLN  COMPARISON:  None.  FINDINGS: Satisfactory opacification of pulmonary arteries noted, and there is no evidence of pulmonary emboli. Patient breathing during the acquisition degrades some of the images. There is four-chamber cardiac enlargement. Thoracic aorta is nondilated, incompletely opacified. Small bilateral pleural effusions. No pericardial effusion. Subcentimeter prevascular, AP window, precarinal, and right paratracheal lymph nodes. No definite hilar adenopathy. Extensive ground-glass opacities throughout both lungs involving the upper lobes more than bases.  There is a small amount of perihepatic and perisplenic ascites. Remainder visualized upper abdomen unremarkable. Small spurs at multiple contiguous levels in the thoracic spine.  Review of the MIP images confirms the above findings.  IMPRESSION: 1. No large or central pulmonary emboli. 2. Extensive ground-glass opacities throughout both lungs, favor edema over alveolitis. 3. Perihepatic and perisplenic ascites.   Electronically Signed   By: Arne Cleveland M.D.   On: 07/07/2013 20:20   US Abdomen Complete  07/08/2013   CLINICAL DATA:  Increasing abdominal girth  EXAM: ULTRASOUND ABDOMEN COMPLETE  COMPARISON:  CT scan of the chest including the upper abdomen 07/07/2013  FINDINGS: Gallbladder:  No gallstones or wall thickening visualized. No sonographic Murphy sign noted.  Common bile duct:  Diameter: Within normal limits at 5.7 mm  Liver:  No focal lesion identified. Dense and heterogeneous echotexture of the hepatic parenchyma. The main portal vein is  patent with normal hepatopetal flow.  IVC:  No abnormality visualized.  Pancreas:  Visualized portion unremarkable.  Spleen:  Size and appearance within normal limits.  Right Kidney:  Length: 11.5 cm. Echogenicity within normal limits. No mass or hydronephrosis visualized.  Left Kidney:  Length: 11.5 cm. Echogenicity within normal limits. No mass or hydronephrosis visualized.  Abdominal aorta:  Partially obscured by overlying bowel gas.  No aneurysm identified.  Other findings:  Small volume ascites.  IMPRESSION: 1. No acute abnormality in the abdomen or pelvis. 2. Nonspecific dense and heterogeneous echotexture of the hepatic parenchyma. This can be seen in the setting of cirrhosis, and also with hepatic steatosis. 3. Small volume of peritoneal ascites.   Electronically Signed   By: Jacqulynn Cadet M.D.   On: 07/08/2013 16:18    ASSESSMENT AND PLAN 1. Congestive heart failure secondary to left ventricular systolic dysfunction of uncertain etiology. Rule out ischemic heart disease. Because of her body habitus she is not a candidate for nuclear stress test.  2.  morbid obesity  3. hypertension  Recommendation: I have recommended to the patient that we proceed with left heart cardiac catheterization. This will be done on Monday, June 1.  Weight up 3 lb since yesterday. Will restart small dose lasix until after cath at which time dose can be increased. Replete K.     Signed, Darlin Coco MD

## 2013-07-12 ENCOUNTER — Encounter (HOSPITAL_COMMUNITY)
Admission: EM | Disposition: A | Discharge status: Home / Self Care | Payer: Self-pay | Source: Home / Self Care | Attending: Internal Medicine

## 2013-07-12 DIAGNOSIS — K7689 Other specified diseases of liver: Secondary | ICD-10-CM | POA: Diagnosis present

## 2013-07-12 DIAGNOSIS — R000 Tachycardia, unspecified: Secondary | ICD-10-CM | POA: Diagnosis present

## 2013-07-12 DIAGNOSIS — I2789 Other specified pulmonary heart diseases: Secondary | ICD-10-CM | POA: Diagnosis present

## 2013-07-12 DIAGNOSIS — I428 Other cardiomyopathies: Secondary | ICD-10-CM | POA: Diagnosis present

## 2013-07-12 DIAGNOSIS — Z79899 Other long term (current) drug therapy: Secondary | ICD-10-CM | POA: Diagnosis not present

## 2013-07-12 DIAGNOSIS — I5021 Acute systolic (congestive) heart failure: Secondary | ICD-10-CM | POA: Diagnosis present

## 2013-07-12 DIAGNOSIS — I110 Hypertensive heart disease with heart failure: Secondary | ICD-10-CM | POA: Diagnosis present

## 2013-07-12 DIAGNOSIS — I509 Heart failure, unspecified: Secondary | ICD-10-CM | POA: Diagnosis present

## 2013-07-12 DIAGNOSIS — R188 Other ascites: Secondary | ICD-10-CM | POA: Diagnosis present

## 2013-07-12 DIAGNOSIS — Z6841 Body Mass Index (BMI) 40.0 and over, adult: Secondary | ICD-10-CM | POA: Diagnosis not present

## 2013-07-12 DIAGNOSIS — K7460 Unspecified cirrhosis of liver: Secondary | ICD-10-CM | POA: Diagnosis present

## 2013-07-12 DIAGNOSIS — Z8249 Family history of ischemic heart disease and other diseases of the circulatory system: Secondary | ICD-10-CM | POA: Diagnosis not present

## 2013-07-12 HISTORY — PX: LEFT HEART CATHETERIZATION WITH CORONARY ANGIOGRAM: SHX5451

## 2013-07-12 LAB — POCT I-STAT 3, VENOUS BLOOD GAS (G3P V)
Acid-Base Excess: 3 mmol/L — ABNORMAL HIGH (ref 0.0–2.0)
Bicarbonate: 30 mEq/L — ABNORMAL HIGH (ref 20.0–24.0)
O2 Saturation: 52 %
TCO2: 32 mmol/L (ref 0–100)
pCO2, Ven: 52.7 mmHg — ABNORMAL HIGH (ref 45.0–50.0)
pH, Ven: 7.363 — ABNORMAL HIGH (ref 7.250–7.300)
pO2, Ven: 29 mmHg — CL (ref 30.0–45.0)

## 2013-07-12 LAB — POCT I-STAT 3, ART BLOOD GAS (G3+)
Acid-Base Excess: 4 mmol/L — ABNORMAL HIGH (ref 0.0–2.0)
Bicarbonate: 30.5 mEq/L — ABNORMAL HIGH (ref 20.0–24.0)
O2 Saturation: 85 %
TCO2: 32 mmol/L (ref 0–100)
pCO2 arterial: 50.6 mmHg — ABNORMAL HIGH (ref 35.0–45.0)
pH, Arterial: 7.389 (ref 7.350–7.450)
pO2, Arterial: 51 mmHg — ABNORMAL LOW (ref 80.0–100.0)

## 2013-07-12 LAB — BASIC METABOLIC PANEL
BUN: 24 mg/dL — ABNORMAL HIGH (ref 6–23)
BUN: 24 mg/dL — ABNORMAL HIGH (ref 6–23)
CO2: 34 mEq/L — ABNORMAL HIGH (ref 19–32)
CO2: 28 mEq/L (ref 19–32)
Calcium: 9.7 mg/dL (ref 8.4–10.5)
Calcium: 9.3 mg/dL (ref 8.4–10.5)
Chloride: 97 mEq/L (ref 96–112)
Chloride: 96 mEq/L (ref 96–112)
Creatinine, Ser: 1.43 mg/dL — ABNORMAL HIGH (ref 0.50–1.10)
Creatinine, Ser: 1.26 mg/dL — ABNORMAL HIGH (ref 0.50–1.10)
GFR calc Af Amer: 45 mL/min — ABNORMAL LOW (ref >90)
GFR calc Af Amer: 53 mL/min — ABNORMAL LOW (ref >90)
GFR calc non Af Amer: 39 mL/min — ABNORMAL LOW (ref >90)
GFR calc non Af Amer: 45 mL/min — ABNORMAL LOW (ref >90)
Glucose, Bld: 98 mg/dL (ref 70–99)
Glucose, Bld: 110 mg/dL — ABNORMAL HIGH (ref 70–99)
Potassium: 4.2 mEq/L (ref 3.7–5.3)
Potassium: 3.7 mEq/L (ref 3.7–5.3)
Sodium: 140 mEq/L (ref 137–147)
Sodium: 137 mEq/L (ref 137–147)

## 2013-07-12 LAB — PROTIME-INR
INR: 1.21 (ref 0.00–1.49)
Prothrombin Time: 15 seconds (ref 11.6–15.2)

## 2013-07-12 SURGERY — LEFT HEART CATHETERIZATION WITH CORONARY ANGIOGRAM
Anesthesia: LOCAL

## 2013-07-12 MED ORDER — FENTANYL CITRATE 0.05 MG/ML IJ SOLN
INTRAMUSCULAR | Status: AC
  Filled 2013-07-12: qty 2

## 2013-07-12 MED ORDER — MIDAZOLAM HCL 2 MG/2ML IJ SOLN
INTRAMUSCULAR | Status: AC
  Filled 2013-07-12: qty 2

## 2013-07-12 MED ORDER — NITROGLYCERIN 0.2 MG/ML ON CALL CATH LAB
INTRAVENOUS | Status: AC
  Filled 2013-07-12: qty 1

## 2013-07-12 MED ORDER — ASPIRIN EC 81 MG PO TBEC: 81.0000 mg | DELAYED_RELEASE_TABLET | Freq: Every day | ORAL | Status: DC

## 2013-07-12 MED ORDER — SODIUM CHLORIDE 0.9 % IV BOLUS (SEPSIS)
500.0000 mL | Freq: Once | INTRAVENOUS | Status: AC
  Administered 2013-07-12: 500 mL via INTRAVENOUS

## 2013-07-12 MED ORDER — SODIUM CHLORIDE 0.9 % IV SOLN: INTRAVENOUS | Status: DC

## 2013-07-12 MED ORDER — LIDOCAINE HCL (PF) 1 % IJ SOLN
INTRAMUSCULAR | Status: AC
  Filled 2013-07-12: qty 30

## 2013-07-12 MED ORDER — HEPARIN (PORCINE) IN NACL 2-0.9 UNIT/ML-% IJ SOLN
INTRAMUSCULAR | Status: AC
  Filled 2013-07-12: qty 1500

## 2013-07-12 NOTE — Progress Notes (Signed)
Patient ID: Phyllis Todd, female   DOB: August 24, 1950, 63 y.o.   MRN: [id number]   Patient Name: Phyllis Todd Date of Encounter: 07/12/2013   Principal Problem:   Acute systolic congestive heart failure, NYHA class 2; EF 35-40% by Echo Active Problems:   Hypertensive heart disease   Essential hypertension   Fluid overload   SUBJECTIVE  No dyspnea at rest.. no chest pain.  Rhythm regular on bedside exam. Just feels dry & has cough.  CURRENT MEDS . [START ON 07/13/2013] aspirin EC  81 mg Oral Daily  . carvedilol  12.5 mg Oral BID WC  . enoxaparin (LOVENOX) injection  40 mg Subcutaneous Q24H  . lisinopril  5 mg Oral Daily  . potassium chloride  20 mEq Oral Daily  . sodium chloride  500 mL Intravenous Once  . sodium chloride  3 mL Intravenous Q12H  . spironolactone  12.5 mg Oral Daily    OBJECTIVE  Filed Vitals:   07/11/13 0900 07/11/13 1337 07/11/13 2224 07/12/13 0453  BP: 113/67 146/88 125/86 144/97  Pulse: 77 84 80 78  Temp: 98.1 F (36.7 C) 97.6 F (36.4 C) 97.3 F (36.3 C) 97.5 F (36.4 C)  TempSrc: Oral Oral Oral Oral  Resp: 20 18 18 18   Height:      Weight:    346 lb 4.8 oz (157.081 kg)  SpO2: 97% 96% 97% 97%    Intake/Output Summary (Last 24 hours) at 07/12/13 0829 Last data filed at 07/12/13 0509  Gross per 24 hour  Intake   1020 ml  Output   1102 ml  Net    -82 ml   Filed Weights   07/10/13 0457 07/11/13 0416 07/12/13 0453  Weight: 343 lb 6.4 oz (155.765 kg) 346 lb 1.6 oz (156.99 kg) 346 lb 4.8 oz (157.081 kg)    PHYSICAL EXAM  General: Pleasant, NAD. Morbidly obese  Psych: Normal affect.  Neuro: Alert and oriented X 3. Moves all extremities spontaneously.  Neck: Supple without bruits or JVD (cannot determine JVP due to body habitus) Lungs: Resp regular and unlabored, CTA.  Heart: RRR no s3, s4, or murmurs. Very distant sounds Abdomen: Soft, non-tender, non-distended, BS + x 4. Extremely obese & protuberant; cannot determine +/- Ascites. Extremities:  Chronic stasis dermatitis and indurated peripheral edema with dry scaly skin  Accessory Clinical Findings  CBC No results found for this basename: WBC, NEUTROABS, HGB, HCT, MCV, PLT,  in the last 72 hours Basic Metabolic Panel  Recent Labs  07/11/13 0350 07/12/13 0552  NA 139 140  K 3.6* 4.2  CL 95* 97  CO2 32 34*  GLUCOSE 92 98  BUN 22 24*  CREATININE 1.35* 1.43*  CALCIUM 9.8 9.7   TELE:  Not on telemetry  ECG    Radiology/Studies  07/07/2013   CLINICAL DATA:  Difficulty breathing when lying supine.  Nonsmoker.  EXAM: ABDOMEN - 1 VIEW  COMPARISON:  None.  FINDINGS: Single erect view of the abdomen demonstrates no evidence of pneumoperitoneum. The visualized bowel gas pattern is normal. The pelvis is not imaged. No suspicious calcifications are seen.  IMPRESSION: No evidence of pneumoperitoneum. The visualized abdominal contents appear unremarkable.   Electronically Signed   By: Camie Patience M.D.   On: 07/07/2013 19:56   Ct Angio Chest Pe W/cm &/or Wo Cm  07/07/2013   CLINICAL DATA:  SOB, bilateral edema  EXAM: CT ANGIOGRAPHY CHEST WITH CONTRAST  TECHNIQUE: Multidetector CT imaging of the chest was performed using the  standard protocol during bolus administration of intravenous contrast. Multiplanar CT image reconstructions and MIPs were obtained to evaluate the vascular anatomy.  CONTRAST:  58mL OMNIPAQUE IOHEXOL 350 MG/ML SOLN  COMPARISON:  None.  FINDINGS: Satisfactory opacification of pulmonary arteries noted, and there is no evidence of pulmonary emboli. Patient breathing during the acquisition degrades some of the images. There is four-chamber cardiac enlargement. Thoracic aorta is nondilated, incompletely opacified. Small bilateral pleural effusions. No pericardial effusion. Subcentimeter prevascular, AP window, precarinal, and right paratracheal lymph nodes. No definite hilar adenopathy. Extensive ground-glass opacities throughout both lungs involving the upper lobes more than  bases.  There is a small amount of perihepatic and perisplenic ascites. Remainder visualized upper abdomen unremarkable. Small spurs at multiple contiguous levels in the thoracic spine.  Review of the MIP images confirms the above findings.  IMPRESSION: 1. No large or central pulmonary emboli. 2. Extensive ground-glass opacities throughout both lungs, favor edema over alveolitis. 3. Perihepatic and perisplenic ascites.   Electronically Signed   By: Arne Cleveland M.D.   On: 07/07/2013 20:20   US Abdomen Complete  07/08/2013   CLINICAL DATA:  Increasing abdominal girth  EXAM: ULTRASOUND ABDOMEN COMPLETE  COMPARISON:  CT scan of the chest including the upper abdomen 07/07/2013  FINDINGS: Gallbladder:  No gallstones or wall thickening visualized. No sonographic Murphy sign noted.  Common bile duct:  Diameter: Within normal limits at 5.7 mm  Liver:  No focal lesion identified. Dense and heterogeneous echotexture of the hepatic parenchyma. The main portal vein is patent with normal hepatopetal flow.  IVC:  No abnormality visualized.  Pancreas:  Visualized portion unremarkable.  Spleen:  Size and appearance within normal limits.  Right Kidney:  Length: 11.5 cm. Echogenicity within normal limits. No mass or hydronephrosis visualized.  Left Kidney:  Length: 11.5 cm. Echogenicity within normal limits. No mass or hydronephrosis visualized.  Abdominal aorta:  Partially obscured by overlying bowel gas.  No aneurysm identified.  Other findings:  Small volume ascites.  IMPRESSION: 1. No acute abnormality in the abdomen or pelvis. 2. Nonspecific dense and heterogeneous echotexture of the hepatic parenchyma. This can be seen in the setting of cirrhosis, and also with hepatic steatosis. 3. Small volume of peritoneal ascites.   Electronically Signed   By: Jacqulynn Cadet M.D.   On: 07/08/2013 16:18    ASSESSMENT AND PLAN 1. Congestive heart failure secondary to left ventricular systolic dysfunction of uncertain etiology.    Plan is to Rule out ischemic heart disease. Because of her body habitus she is not a candidate for nuclear stress test.  -- Unfortunately, her Cr is rising making LHC less favorable.  500 ml IVF bolus being administered -- will recheck BMP @ ~12-1 pm 2. 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 3. hypertension - on stable dose of BB & ACE-i (one renal function is stable, would try to up-titrate ACE-I). May need to convert to ARB with cough.  Recommendation: I have recommended to the patient that we proceed with R& Left Heart cardiac catheterization.   The procedure with Risks/Benefits/Alternatives and Indications was reviewed with the patient and friend.  All questions were answered.    Risks / Complications include, but not limited to: Death, MI, CVA/TIA, VF/VT (with defibrillation), Bradycardia (need for temporary pacer placement), contrast induced nephropathy, bleeding / bruising / hematoma / pseudoaneurysm, vascular or coronary  injury (with possible emergent CT or Vascular Surgery), adverse medication reactions, infection.    The patient voices understanding and agree to proceed.     Weight up 3 lb since 2 d ago, but stable. Holding Lasix until post-cath. Replete K.  Signed, Leonie Man, M.D., M.S. Interventional Cardiologist   Pager # [phone] 07/12/2013

## 2013-07-12 NOTE — Progress Notes (Addendum)
[facility] PROGRESS NOTE Interim History: 63 y.o. female with no significant past medical history has been experiencing increasing shortness of breath over the last 2 months with increasing abdominal girth and lower extremity edema. Patient has been sleeping propped up over the last 2 months. Denies any chest pain fever chills.  Filed Weights   07/10/13 0457 07/11/13 0416 07/12/13 0453  Weight: 155.765 kg (343 lb 6.4 oz) 156.99 kg (346 lb 1.6 oz) 157.081 kg (346 lb 4.8 oz)        Intake/Output Summary (Last 24 hours) at 07/12/13 0836 Last data filed at 07/12/13 0509  Gross per 24 hour  Intake    820 ml  Output   1102 ml  Net   -282 ml     Assessment/Plan: Acute systolic heart failure: - Cr high, cont to hold lasix. Give NS bolus. - Liberalize diet, strict I and O's. Daily weights. - Cont ACE-I and BB. Echo hard to evaluate EF repeat with contrast as below.  Hypertensive heart disease: - On admission significantly high. - Started on lasix, ACE-I and beta-blocker with improvement in BP. - Cont low dose aldactone.   Cirrhosis: - By [place]. - ? Most likely due to to fatty liver. No history of ETOH.  Code Status: full Family Communication: friend  Disposition Plan: inpatient   Consultants:  none  Procedures: ECHO on 5.30.2015: ejection fraction was in the range of 35% to 40%. Although no diagnostic regional wall motion abnormality was identified, this possibility cannot be completely excluded on the basis of this study.   Antibiotics:  None  HPI/Subjective: No complains  Objective: Filed Vitals:   07/11/13 0900 07/11/13 1337 07/11/13 2224 07/12/13 0453  BP: 113/67 146/88 125/86 144/97  Pulse: 77 84 80 78  Temp: 98.1 F (36.7 C) 97.6 F (36.4 C) 97.3 F (36.3 C) 97.5 F (36.4 C)  TempSrc: Oral Oral Oral Oral  Resp: 20 18 18 18   Height:      Weight:    157.081 kg (346 lb 4.8 oz)  SpO2: 97% 96% 97% 97%     Exam:  General: Alert, awake,  oriented x3, in no acute distress. Morbidly obese, anasarca. HEENT: No bruits, no goiter.  Heart: Regular rate and rhythm. Lungs: Good air movement, clear Abdomen: Soft, nontender, nondistended, positive bowel sounds.     Data Reviewed: Basic Metabolic Panel:  Recent Labs Lab 07/08/13 0358 07/09/13 0515 07/10/13 0305 07/11/13 0350 07/12/13 0552  NA 143 140 144 139 140  K 3.8 3.7 3.5* 3.6* 4.2  CL 99 97 98 95* 97  CO2 29 31 31  32 34*  GLUCOSE 79 93 89 92 98  BUN 13 12 16 22  24*  CREATININE 1.04 1.08 1.34* 1.35* 1.43*  CALCIUM 10.2 9.5 9.7 9.8 9.7   Liver Function Tests:  Recent Labs Lab 07/07/13 1738  AST 25  ALT 19  ALKPHOS 107  BILITOT 1.3*  PROT 7.1  ALBUMIN 3.3*   No results found for this basename: LIPASE, AMYLASE,  in the last 168 hours No results found for this basename: AMMONIA,  in the last 168 hours CBC:  Recent Labs Lab 07/07/13 1738 07/08/13 0358  WBC 9.2 9.8  HGB 14.1 13.3  HCT 43.8 41.8  MCV 88.7 88.6  PLT 312 346   Cardiac Enzymes: No results found for this basename: CKTOTAL, CKMB, CKMBINDEX, TROPONINI,  in the last 168 hours BNP (last 3 results)  Recent Labs  07/07/13 1738  PROBNP 3249.0*   CBG:  No results found for this basename: GLUCAP,  in the last 168 hours  No results found for this or any previous visit (from the past 240 hour(s)).   Studies: No results found.  Scheduled Meds: . [START ON 07/13/2013] aspirin EC  81 mg Oral Daily  . carvedilol  12.5 mg Oral BID WC  . enoxaparin (LOVENOX) injection  40 mg Subcutaneous Q24H  . lisinopril  5 mg Oral Daily  . potassium chloride  20 mEq Oral Daily  . sodium chloride  500 mL Intravenous Once  . sodium chloride  3 mL Intravenous Q12H  . spironolactone  12.5 mg Oral Daily   Continuous Infusions: . sodium chloride 50 mL/hr at 07/12/13 [facility] Pager [phone] If 8PM-8AM, please contact night-coverage at [email or web address], password  [facility] 07/12/2013, 8:36 AM  LOS: 5 days

## 2013-07-12 NOTE — Progress Notes (Signed)
Report given to oncoming nurse. Reassessed right groin with oncoming nurse and is still a level zero. Bedrest is up at 8p. Oncoming nurse will continue to monitor patient to end of shift. Nurse signing off at this time. Thanks.

## 2013-07-12 NOTE — Progress Notes (Signed)
Patient returned from the heart catheterization procedure. VSS . Right groin is level zero and was assessed with Cath nurse. Transferred patient to hospital bed in the room. Patient on bedrest and has IV fluids infusing. Currently patient complains of no pain. Will continue to monitor vital signs and monitor patient to end of shift.

## 2013-07-12 NOTE — CV Procedure (Signed)
Phyllis Todd is a 63 y.o. female   [id number]  [id number] LOCATION:  FACILITY: [facility]  PHYSICIAN: Troy Sine, MD, [facility] [birth year]   DATE OF PROCEDURE:  07/12/2013      RIGHT AND LEFT HEART CARDIAC CATHETERIZATION   HISTORY:   Phyllis Todd is a 63 y.o. female with a history of morbid obesity, hypertension, and documentation of ejection fraction 35-40% by echo.  She is continued to have difficulty with volume overload and dyspnea.  She is now referred for definitive right and left heart catheterization.   PROCEDURE: Right and left heart catheterization: Swan-Ganz catheterization, cardiac output determinations by the thermodilution and assumed Fick method, coronary angiography, left ventriculography.  The patient was brought to the second floor [place] [facility]iac cath lab in the postabsorptive state. Versed 2 mg and fentanyl 50 mcg were administered for conscious sedation. The right groin was prepped and draped in sterile fashion and a 5 [place] arterial sheath and 7 French venous sheath were inserted without difficulty. A Swan-Ganz catheter was advanced into the venous sheath and pressures were obtained in the right atrium, right ventricle, pulmonary artery, and pulmonary capillary wedge position. Cardiac outputs were obtained by the thermodilution and assumed Fick methods. Oxygen saturation was obtained in the pulmonary artery and aorta. A pigtail catheter was inserted and simultaneous AO/PA pressures were recorded. The pigtail catheter was advanced into the left ventricle and simultaneous left ventricular and PCW pressures were recorded. Left ventriculography was performed in the RAO projection.  A left ventricle to aorta pullback was performed. The pigtail catheter was then removed and diagnostic catheterization to delineate the coronary anatomy was performed utilizing 5 French Judkins 4 left and right diagnostic catheters. All catheters were removed and the patient. Hemostasis was obtained by  direct manual pressure. The patient tolerated the procedure well and returned to his room in satisfactory condition.   HEMODYNAMICS:   RA: a 31; v 33 mean 26 RV: 60/28 PA: 61/33; mean 44 PC:  mean 24  Ao: 150/105 PA: 62/28  LV: 140/27 PC: a 25; mean 21  LV: 150/27 Ao: 150/105  Simultaneous LV and RV pressures were also obtained.   Oxygen saturation in the aorta 85% and the pulmonary artery 52%   Cardiac output: 3.1 l/min (Thermo); 1.3 (Fick)  Cardiac index: 5.5 l/m/m2                2.2  ANGIOGRAPHY:   Left main: Angiographically normal and bifurcated into the LAD and left circumflex coronary arteries  LAD: Angiographically normal and gave rise to a first diagonal vessel.  Several septal perforator arteries and a smaller more distal diagonal vessel.  Left circumflex: Angiographically normal and gave rise to one major marginal vessel  Right coronary artery: Angiographically normal vessel which gave rise to a large PDA vessel which supplied most entire inferior posterior wall  Left ventriculography  revealed evidence for left ventricular hypertrophy.  There was moderate global LV dysfunction with an ejection fraction in the 35-40% range.  He was diffuse hypokinesis, but this appeared to be more pronounced in the mid anterolateral wall.  No significant MR was appreciated.   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  Moderately severe to severe pulmonary hypertension and right-sided heart pressure elevation  RECOMMENDATION:  Medical therapy.  The patient may very well have a component of obesity/hypoventilation syndrome and should also be evaluated for sleep apnea.  There is evidence for significant elevation of  right heart pressures with near equalization of pressure recordings without evidence for a pericardial effusion or constrictive physiology.    Troy Sine, MD,  [facility] 07/12/2013 3:37 PM

## 2013-07-12 NOTE — Progress Notes (Signed)
UR completed Tsuyako Jolley K. Earmon Sherrow, RN, BSN, [place], CCM  07/12/2013 12:05 PM

## 2013-07-13 DIAGNOSIS — I5041 Acute combined systolic (congestive) and diastolic (congestive) heart failure: Secondary | ICD-10-CM

## 2013-07-13 DIAGNOSIS — I428 Other cardiomyopathies: Secondary | ICD-10-CM

## 2013-07-13 LAB — BASIC METABOLIC PANEL
BUN: 24 mg/dL — ABNORMAL HIGH (ref 6–23)
CO2: 30 mEq/L (ref 19–32)
Calcium: 9.5 mg/dL (ref 8.4–10.5)
Chloride: 97 mEq/L (ref 96–112)
Creatinine, Ser: 1.28 mg/dL — ABNORMAL HIGH (ref 0.50–1.10)
GFR calc Af Amer: 52 mL/min — ABNORMAL LOW (ref >90)
GFR calc non Af Amer: 45 mL/min — ABNORMAL LOW (ref >90)
Glucose, Bld: 99 mg/dL (ref 70–99)
Potassium: 4.7 mEq/L (ref 3.7–5.3)
Sodium: 137 mEq/L (ref 137–147)

## 2013-07-13 MED ORDER — FUROSEMIDE 40 MG PO TABS: 40.0000 mg | ORAL_TABLET | Freq: Two times a day (BID) | ORAL | Status: DC

## 2013-07-13 MED ORDER — CARVEDILOL 12.5 MG PO TABS
12.5000 mg | ORAL_TABLET | Freq: Two times a day (BID) | ORAL | Status: DC
Start: 2013-07-13 — End: 2013-08-23

## 2013-07-13 MED ORDER — FUROSEMIDE 40 MG PO TABS: ORAL_TABLET | ORAL | Status: DC

## 2013-07-13 MED ORDER — FUROSEMIDE 10 MG/ML IJ SOLN
40.0000 mg | Freq: Two times a day (BID) | INTRAMUSCULAR | Status: DC
  Administered 2013-07-13: 40 mg via INTRAVENOUS
  Filled 2013-07-13: qty 4

## 2013-07-13 MED ORDER — ASPIRIN 81 MG PO TBEC: 81.0000 mg | DELAYED_RELEASE_TABLET | Freq: Every day | ORAL | Status: DC

## 2013-07-13 MED ORDER — LISINOPRIL 5 MG PO TABS: 5.0000 mg | ORAL_TABLET | Freq: Every day | ORAL | Status: DC

## 2013-07-13 MED ORDER — FUROSEMIDE 40 MG PO TABS
40.0000 mg | ORAL_TABLET | Freq: Every day | ORAL | Status: DC
  Filled 2013-07-13: qty 1

## 2013-07-13 MED ORDER — POTASSIUM CHLORIDE CRYS ER 20 MEQ PO TBCR: 20.0000 meq | EXTENDED_RELEASE_TABLET | Freq: Every day | ORAL | Status: DC

## 2013-07-13 MED ORDER — SPIRONOLACTONE 12.5 MG HALF TABLET
12.5000 mg | ORAL_TABLET | Freq: Every day | ORAL | Status: DC
Start: 2013-07-13 — End: 2013-07-26

## 2013-07-13 NOTE — Progress Notes (Signed)
Patient ID: Phyllis Todd, female   DOB: [birth year], 63 y.o.   MRN: [id number]  Date of Encounter: 07/13/2013   Principal Problem:   Acute systolic congestive heart failure, NYHA class 2; EF 35-40% by Echo Active Problems:   Hypertensive heart disease   Essential hypertension   Fluid overload   Non-ischemic cardiomyopathy    ASSESSMENT AND PLAN  1. Non-ischemic CM with acute combined Systolic & Diastolic HF  The gist of the cath findings is that she remains volume overloaded & is probably well above her desired dry weight.  -- Afterload reduction & diuresis is the main Rx  As she is feeling symptomatically improved, she should be ready for d/c - but would give additional dose of PO Lasix prior to d/c -- recommend 80mg  AM & 40 mg PM lasix this week until seen by Dr. Aundra Dubin (BMP prior to visit).  Will need to stress importance of Sliding Scale Rx. 2. morbid obesity  - most likely the cause of CHF -- with RV reduced function; interestingly, the Pulm HTN appears to be mostly related to LV Failure & not RV failure/PAH 3. hypertension - on stable dose of BB & ACE-i (one renal function is stable, would try to up-titrate ACE-I as OP). May need to convert to ARB with cough.  I discussed her case with Dr. Olevia Bowens -- plan is d/c home today with close f/u to see Dr. Aundra Dubin on Friday.    SUBJECTIVE  No dyspnea at rest.. no chest pain.  Rhythm regular on bedside exam.  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    CURRENT MEDS . aspirin EC  81 mg Oral Daily  . carvedilol  12.5 mg Oral BID WC  . enoxaparin (LOVENOX) injection  40 mg Subcutaneous Q24H  . furosemide  40 mg Intravenous BID  . lisinopril  5 mg Oral Daily  . potassium chloride  20  mEq Oral Daily  . spironolactone  12.5 mg Oral Daily    OBJECTIVE  Filed Vitals:   07/12/13 2000 07/12/13 2117 07/13/13 0211 07/13/13 0600  BP: 161/90 147/95 121/78 123/77  Pulse: 72 73 71 75  Temp:  97.7 F (36.5 C) 97.9 F (36.6 C) 98 F (36.7 C)  TempSrc:  Oral Oral Oral  Resp:  18 18 18   Height:      Weight:    351 lb 3.2 oz (159.303 kg)  SpO2:  98% 95% 99%    Intake/Output Summary (Last 24 hours) at 07/13/13 0959 Last data filed at 07/13/13 0832  Gross per 24 hour  Intake   2060 ml  Output    977 ml  Net   1083 ml   Filed Weights   07/11/13 0416 07/12/13 0453 07/13/13 0600  Weight: 346 lb 1.6 oz (156.99 kg) 346 lb 4.8 oz (157.081 kg) 351 lb 3.2 oz (159.303 kg)    PHYSICAL EXAM  General: Pleasant, NAD. Morbidly obese  Psych: Normal affect.  Neuro: Alert and oriented X 3. Moves all extremities spontaneously.  Neck: Supple without bruits or JVD (cannot determine JVP due to body habitus) Lungs: Resp regular and unlabored, CTA.  Heart: RRR no s3, s4, or murmurs. Very distant sounds Abdomen: Soft, non-tender, non-distended,  BS + x 4. Extremely obese & protuberant; cannot determine +/- Ascites. Extremities: Chronic stasis dermatitis and indurated peripheral edema with dry scaly skin  Accessory Clinical Findings  CBC No results found for this basename: WBC, NEUTROABS, HGB, HCT, MCV, PLT,  in the last 72 hours Basic Metabolic Panel  Recent Labs  07/12/13 1200 07/13/13 0454  NA 137 137  K 3.7 4.7  CL 96 97  CO2 28 30  GLUCOSE 110* 99  BUN 24* 24*  CREATININE 1.26* 1.28*  CALCIUM 9.3 9.5   TELE:  Not on  ECG  Signed, Leonie Man, M.D., M.S. Interventional Cardiologist   Pager # [phone] 07/13/2013

## 2013-07-13 NOTE — Discharge Summary (Addendum)
Physician Discharge Summary  Phyllis Todd RAQ:[id number] DOB: 04-Apr-1950 DOA: 07/07/2013  PCP: PROVIDER NOT IN SYSTEM  Admit date: 07/07/2013 Discharge date: 07/13/2013  Time spent: 35 minutes  Recommendations for Outpatient Follow-up:  1. Follow up with HF clinic in 1 week. 2. Check weight and B-met titrate medications as tolerated.  BNP    Component Value Date/Time   PROBNP 3249.0* 07/07/2013 1738   Filed Weights   07/11/13 0416 07/12/13 0453 07/13/13 0600  Weight: 156.99 kg (346 lb 1.6 oz) 157.081 kg (346 lb 4.8 oz) 159.303 kg (351 lb 3.2 oz)     Discharge Diagnoses:  Principal Problem:   Acute systolic congestive heart failure, NYHA class 2; EF 35-40% by Echo Active Problems:   Hypertensive heart disease   Fluid overload   Essential hypertension   Non-ischemic cardiomyopathy   Discharge Condition: Stable  Diet recommendation: low sodium    History of present illness:  63 y.o. female with no significant past medical history has been experiencing increasing shortness of breath over the last 2 months with increasing abdominal girth and lower extremity edema. Patient has been sleeping propped up over the last 2 months. Denies any chest pain fever chills. Has been having some nonproductive cough. In the ER chest x-ray shows cardiomegaly and CT head of the chest shows diffuse groundglass opacity concerning for alveolitis versus CHF. Patient's BNP has been elevated and on exam patient has significant abdominal distention and lower extremity edema. Patient has made good urine output after Lasix 40 mg IV. Patient has been admitted for further workup. Patient denies any nausea vomiting abdominal pain diarrhea headache visual symptoms.    Hospital Course:  Acute systolic heart failure:  - Start on IV lasix diurese about 15L, echo depressed cardiac function. - Consulted cardiology cath showed as below - Cont oral lasix 80 in the am and 40 in the pm for 3 days then 40 mg twice a  day. Pt will like to follow with Dr. Aundra Dubin - Cont ACE-I, aldactone and BB.   Hypertensive heart disease:  - On admission significantly high.  - Started on lasix, ACE-I and beta-blocker with improvement in BP.  - Cont low dose aldactone.   Cirrhosis:  - By [place].  - ? Most likely due to to fatty liver. No history of ETOH   Procedures: Cardiac cath 6.1.2015: Nonischemic cardiomyopathy with an ejection fraction of 35-40% and evidence for left ventricular hypertrophy. Moderately severe to severe pulmonary hypertension and right-sided heart pressure elevation  Consultations:  cardiology  Discharge Exam: Filed Vitals:   07/13/13 0600  BP: 123/77  Pulse: 75  Temp: 98 F (36.7 C)  Resp: 18    General: A&O x3 Cardiovascular: RRR Respiratory: Good air movement CTA B/L  Discharge Instructions      Discharge Instructions   Diet - low sodium heart healthy    Complete by:  As directed      Increase activity slowly    Complete by:  As directed             Medication List         acetaminophen 500 MG tablet  Commonly known as:  TYLENOL  Take 500 mg by mouth every 6 (six) hours as needed (pain).     aspirin 81 MG EC tablet  Take 1 tablet (81 mg total) by mouth daily.     carvedilol 12.5 MG tablet  Commonly known as:  COREG  Take 1 tablet (12.5 mg total) by mouth 2 (  two) times daily with a meal.     diphenhydrAMINE 25 mg capsule  Commonly known as:  BENADRYL  Take 25 mg by mouth every 8 (eight) hours as needed for allergies.     docusate sodium 100 MG capsule  Commonly known as:  COLACE  Take 100 mg by mouth 3 (three) times daily as needed for mild constipation.     furosemide 40 MG tablet  Commonly known as:  LASIX  Take 1 tablet (40 mg total) by mouth 2 (two) times daily.     lisinopril 5 MG tablet  Commonly known as:  PRINIVIL,ZESTRIL  Take 1 tablet (5 mg total) by mouth daily.     multivitamin with minerals Tabs tablet  Take 1 tablet by mouth daily.      potassium chloride SA 20 MEQ tablet  Commonly known as:  K-DUR,KLOR-CON  Take 1 tablet (20 mEq total) by mouth daily.     spironolactone 12.5 mg Tabs tablet  Commonly known as:  ALDACTONE  Take 0.5 tablets (12.5 mg total) by mouth daily.       Allergies  Allergen Reactions  . Codeine Other (See Comments)    "makes me hyper"  . Demerol [Meperidine] Other (See Comments)    "sick"  . Peanut-Containing Drug Products Hives and Swelling    "almonds"   Follow-up Information   Follow up with [facility] In 1 week. (hospital follow up)    Specialty:  Cardiology   Contact information:   [address] [id number] [place] [address] [phone]       The results of significant diagnostics from this hospitalization (including imaging, microbiology, ancillary and laboratory) are listed below for reference.    Significant Diagnostic Studies: Dg Chest 2 View  07/07/2013   CLINICAL DATA:  Difficulty breathing. No history of smoking. Chest pain.  EXAM: CHEST  2 VIEW  COMPARISON:  None.  FINDINGS: The heart is enlarged. The mediastinal contours are normal. There is interstitial prominence throughout both lungs without abnormality of the pulmonary vasculature. There is no confluent airspace opacity or significant pleural effusion. The osseous structures appear normal.  IMPRESSION: Cardiomegaly with generalized interstitial prominence. No definite edema or focal airspace disease.   Electronically Signed   By: Camie Patience M.D.   On: 07/07/2013 19:55   Dg Abd 1 View  07/07/2013   CLINICAL DATA:  Difficulty breathing when lying supine.  Nonsmoker.  EXAM: ABDOMEN - 1 VIEW  COMPARISON:  None.  FINDINGS: Single erect view of the abdomen demonstrates no evidence of pneumoperitoneum. The visualized bowel gas pattern is normal. The pelvis is not imaged. No suspicious calcifications are seen.  IMPRESSION: No evidence of pneumoperitoneum. The visualized  abdominal contents appear unremarkable.   Electronically Signed   By: Camie Patience M.D.   On: 07/07/2013 19:56   Ct Angio Chest Pe W/cm &/or Wo Cm  07/07/2013   CLINICAL DATA:  SOB, bilateral edema  EXAM: CT ANGIOGRAPHY CHEST WITH CONTRAST  TECHNIQUE: Multidetector CT imaging of the chest was performed using the standard protocol during bolus administration of intravenous contrast. Multiplanar CT image reconstructions and MIPs were obtained to evaluate the vascular anatomy.  CONTRAST:  85m OMNIPAQUE IOHEXOL 350 MG/ML SOLN  COMPARISON:  None.  FINDINGS: Satisfactory opacification of pulmonary arteries noted, and there is no evidence of pulmonary emboli. Patient breathing during the acquisition degrades some of the images. There is four-chamber cardiac enlargement. Thoracic aorta is nondilated, incompletely opacified.  Small bilateral pleural effusions. No pericardial effusion. Subcentimeter prevascular, AP window, precarinal, and right paratracheal lymph nodes. No definite hilar adenopathy. Extensive ground-glass opacities throughout both lungs involving the upper lobes more than bases.  There is a small amount of perihepatic and perisplenic ascites. Remainder visualized upper abdomen unremarkable. Small spurs at multiple contiguous levels in the thoracic spine.  Review of the MIP images confirms the above findings.  IMPRESSION: 1. No large or central pulmonary emboli. 2. Extensive ground-glass opacities throughout both lungs, favor edema over alveolitis. 3. Perihepatic and perisplenic ascites.   Electronically Signed   By: Arne Cleveland M.D.   On: 07/07/2013 20:20   US Abdomen Complete  07/08/2013   CLINICAL DATA:  Increasing abdominal girth  EXAM: ULTRASOUND ABDOMEN COMPLETE  COMPARISON:  CT scan of the chest including the upper abdomen 07/07/2013  FINDINGS: Gallbladder:  No gallstones or wall thickening visualized. No sonographic Murphy sign noted.  Common bile duct:  Diameter: Within normal limits at 5.7  mm  Liver:  No focal lesion identified. Dense and heterogeneous echotexture of the hepatic parenchyma. The main portal vein is patent with normal hepatopetal flow.  IVC:  No abnormality visualized.  Pancreas:  Visualized portion unremarkable.  Spleen:  Size and appearance within normal limits.  Right Kidney:  Length: 11.5 cm. Echogenicity within normal limits. No mass or hydronephrosis visualized.  Left Kidney:  Length: 11.5 cm. Echogenicity within normal limits. No mass or hydronephrosis visualized.  Abdominal aorta:  Partially obscured by overlying bowel gas.  No aneurysm identified.  Other findings:  Small volume ascites.  IMPRESSION: 1. No acute abnormality in the abdomen or pelvis. 2. Nonspecific dense and heterogeneous echotexture of the hepatic parenchyma. This can be seen in the setting of cirrhosis, and also with hepatic steatosis. 3. Small volume of peritoneal ascites.   Electronically Signed   By: Jacqulynn Cadet M.D.   On: 07/08/2013 16:18    Microbiology: No results found for this or any previous visit (from the past 240 hour(s)).   Labs: Basic Metabolic Panel:  Recent Labs Lab 07/10/13 0305 07/11/13 0350 07/12/13 0552 07/12/13 1200 07/13/13 0454  NA 144 139 140 137 137  K 3.5* 3.6* 4.2 3.7 4.7  CL 98 95* 97 96 97  CO2 31 32 34* 28 30  GLUCOSE 89 92 98 110* 99  BUN 16 22 24* 24* 24*  CREATININE 1.34* 1.35* 1.43* 1.26* 1.28*  CALCIUM 9.7 9.8 9.7 9.3 9.5   Liver Function Tests:  Recent Labs Lab 07/07/13 1738  AST 25  ALT 19  ALKPHOS 107  BILITOT 1.3*  PROT 7.1  ALBUMIN 3.3*   No results found for this basename: LIPASE, AMYLASE,  in the last 168 hours No results found for this basename: AMMONIA,  in the last 168 hours CBC:  Recent Labs Lab 07/07/13 1738 07/08/13 0358  WBC 9.2 9.8  HGB 14.1 13.3  HCT 43.8 41.8  MCV 88.7 88.6  PLT 312 346   Cardiac Enzymes: No results found for this basename: CKTOTAL, CKMB, CKMBINDEX, TROPONINI,  in the last 168  hours BNP: BNP (last 3 results)  Recent Labs  07/07/13 1738  PROBNP 3249.0*   CBG: No results found for this basename: GLUCAP,  in the last 168 hours     Signed:  [facility] 07/13/2013, 7:30 AM   **Disclaimer: This note may have been dictated with voice recognition software. Similar sounding words can inadvertently be transcribed and this note may contain  transcription errors which may not have been corrected upon publication of note.**

## 2013-07-16 ENCOUNTER — Telehealth (HOSPITAL_COMMUNITY): Payer: Self-pay | Admitting: Cardiology

## 2013-07-16 NOTE — Telephone Encounter (Signed)
Pt left message with c/o cough after starting LISINOPRIL  Please advise

## 2013-07-17 ENCOUNTER — Telehealth: Payer: Self-pay | Admitting: Physician Assistant

## 2013-07-17 ENCOUNTER — Other Ambulatory Visit: Payer: Self-pay

## 2013-07-17 ENCOUNTER — Emergency Department (HOSPITAL_COMMUNITY): Payer: BC Managed Care – PPO

## 2013-07-17 ENCOUNTER — Encounter (HOSPITAL_COMMUNITY): Payer: Self-pay | Admitting: Emergency Medicine

## 2013-07-17 ENCOUNTER — Emergency Department (HOSPITAL_COMMUNITY)
Admission: EM | Admit: 2013-07-17 | Discharge: 2013-07-17 | Disposition: A | Discharge status: Home Health | Payer: BC Managed Care – PPO | Attending: Emergency Medicine | Admitting: Emergency Medicine

## 2013-07-17 DIAGNOSIS — J069 Acute upper respiratory infection, unspecified: Secondary | ICD-10-CM | POA: Insufficient documentation

## 2013-07-17 DIAGNOSIS — Z792 Long term (current) use of antibiotics: Secondary | ICD-10-CM | POA: Insufficient documentation

## 2013-07-17 DIAGNOSIS — Z79899 Other long term (current) drug therapy: Secondary | ICD-10-CM | POA: Insufficient documentation

## 2013-07-17 DIAGNOSIS — B9789 Other viral agents as the cause of diseases classified elsewhere: Secondary | ICD-10-CM

## 2013-07-17 DIAGNOSIS — Z7982 Long term (current) use of aspirin: Secondary | ICD-10-CM | POA: Insufficient documentation

## 2013-07-17 DIAGNOSIS — I509 Heart failure, unspecified: Secondary | ICD-10-CM | POA: Insufficient documentation

## 2013-07-17 HISTORY — DX: Heart failure, unspecified: I50.9

## 2013-07-17 LAB — BASIC METABOLIC PANEL
BUN: 19 mg/dL (ref 6–23)
CO2: 29 mEq/L (ref 19–32)
Calcium: 10.3 mg/dL (ref 8.4–10.5)
Chloride: 91 mEq/L — ABNORMAL LOW (ref 96–112)
Creatinine, Ser: 1.11 mg/dL — ABNORMAL HIGH (ref 0.50–1.10)
GFR calc Af Amer: 61 mL/min — ABNORMAL LOW (ref >90)
GFR calc non Af Amer: 53 mL/min — ABNORMAL LOW (ref >90)
Glucose, Bld: 111 mg/dL — ABNORMAL HIGH (ref 70–99)
Potassium: 3.6 mEq/L — ABNORMAL LOW (ref 3.7–5.3)
Sodium: 133 mEq/L — ABNORMAL LOW (ref 137–147)

## 2013-07-17 LAB — CBC WITH DIFFERENTIAL/PLATELET
Basophils Absolute: 0 10*3/uL (ref 0.0–0.1)
Basophils Relative: 0 % (ref 0–1)
Eosinophils Absolute: 0.1 10*3/uL (ref 0.0–0.7)
Eosinophils Relative: 2 % (ref 0–5)
HCT: 39.3 % (ref 36.0–46.0)
Hemoglobin: 12.5 g/dL (ref 12.0–15.0)
Lymphocytes Relative: 6 % — ABNORMAL LOW (ref 12–46)
Lymphs Abs: 0.4 10*3/uL — ABNORMAL LOW (ref 0.7–4.0)
MCH: 27.5 pg (ref 26.0–34.0)
MCHC: 31.8 g/dL (ref 30.0–36.0)
MCV: 86.6 fL (ref 78.0–100.0)
Monocytes Absolute: 1.2 10*3/uL — ABNORMAL HIGH (ref 0.1–1.0)
Monocytes Relative: 18 % — ABNORMAL HIGH (ref 3–12)
Neutro Abs: 4.7 10*3/uL (ref 1.7–7.7)
Neutrophils Relative %: 74 % (ref 43–77)
Platelets: 274 10*3/uL (ref 150–400)
RBC: 4.54 MIL/uL (ref 3.87–5.11)
RDW: 16.1 % — ABNORMAL HIGH (ref 11.5–15.5)
WBC: 6.4 10*3/uL (ref 4.0–10.5)

## 2013-07-17 LAB — PRO B NATRIURETIC PEPTIDE: Pro B Natriuretic peptide (BNP): 4574 pg/mL — ABNORMAL HIGH (ref 0–125)

## 2013-07-17 LAB — I-STAT TROPONIN, ED: Troponin i, poc: 0.02 ng/mL (ref 0.00–0.08)

## 2013-07-17 MED ORDER — LEVOFLOXACIN 750 MG PO TABS
750.0000 mg | ORAL_TABLET | Freq: Once | ORAL | Status: AC
  Administered 2013-07-17: 750 mg via ORAL
  Filled 2013-07-17: qty 1

## 2013-07-17 MED ORDER — BENZONATATE 100 MG PO CAPS: 200.0000 mg | ORAL_CAPSULE | Freq: Two times a day (BID) | ORAL | Status: DC | PRN

## 2013-07-17 MED ORDER — LEVOFLOXACIN 250 MG PO TABS: 750.0000 mg | ORAL_TABLET | Freq: Every day | ORAL | Status: DC

## 2013-07-17 NOTE — ED Provider Notes (Signed)
Date: 07/17/2013  Rate: 91  Rhythm: normal sinus rhythm  QRS Axis: normal  Intervals: normal  ST/T Wave abnormalities: nonspecific ST changes  Conduction Disutrbances:none  Narrative Interpretation:   Old EKG Reviewed: none available  Patient here with cough and congestion and URI symptoms. No evidence of CHF here on chest x-ray. Patient's elevated BNP noted that she denies any orthopnea. Does have a low-grade temperature we'll be treated for presumptive pneumonia.    Leota Jacobsen, MD 07/17/13 [phone]

## 2013-07-17 NOTE — Telephone Encounter (Signed)
Mrs. Tyner paged the after-hour answering service on 07/17/2013 complaining of fever and cough. Per record, she was recently discharged on 07/12/2013 with acute systolic HF. She underwent a left and R heart cath on 5/27 which showed nonischemic cardiomyopathy with EF 35-40%, moderately severe to severe pulmonary hypertension and R sided heart pressure elevation. Treatment goal focused on afterload reduction and diuresis. She was eventually discharged to continue diuresis.   She states she has been compliant with diuretic and lost more than 10 lbs since discharge. However her cough has been getting worse along with white foamy phlegm. She states since she resumed lasix at home, her lower extremity edema has improved. She denies any PND since discharge and states she has not experienced worsening SOB. Of note, she was started on lisinopril. Yesterday, she began to have a low grade fever. Today, her temperature went up to 100.5 along with worsening cough. She states her R groin cath site has been healed without any significant surrounding erythema or pain. She denies any recent chest pain or increasing shortness of breath. She denies any urinary frequency or burning sensation.  Although lisinopril can potentially cause cough, however she has been experience productive cough with white phlegm and fever. Her heart failure symptom appears to be improving with good weight loss on diuresis. Patient was recommended to go to urgent care for fever workup. However she has also been advised to come to the ED or give [facility] a call if her symptom worsen despite treatment or if she experience any chest pain, weight gain, increasing SOB, trouble lying down or persistent fever.  Hilbert Corrigan PA Pager: [phone]

## 2013-07-17 NOTE — Discharge Instructions (Signed)
Cough may be caused by Lisinopril; however, do not discontinue this medication until discuss your symptoms with your cardiologist on Tuesday. Recommend you take Levaquin as prescribed. You may take Tessalon as prescribed for cough. Return to the emergency department if symptoms worsen.  Upper Respiratory Infection, Adult An upper respiratory infection (URI) is also known as the common cold. It is often caused by a type of germ (virus). Colds are easily spread (contagious). You can pass it to others by kissing, coughing, sneezing, or drinking out of the same glass. Usually, you get better in 1 or 2 weeks.  HOME CARE   Only take medicine as told by your doctor.  Use a warm mist humidifier or breathe in steam from a hot shower.  Drink enough water and fluids to keep your pee (urine) clear or pale yellow.  Get plenty of rest.  Return to work when your temperature is back to normal or as told by your doctor. You may use a face mask and wash your hands to stop your cold from spreading. GET HELP RIGHT AWAY IF:   After the first few days, you feel you are getting worse.  You have questions about your medicine.  You have chills, shortness of breath, or brown or red spit (mucus).  You have yellow or brown snot (nasal discharge) or pain in the face, especially when you bend forward.  You have a fever, puffy (swollen) neck, pain when you swallow, or white spots in the back of your throat.  You have a bad headache, ear pain, sinus pain, or chest pain.  You have a high-pitched whistling sound when you breathe in and out (wheezing).  You have a lasting cough or cough up blood.  You have sore muscles or a stiff neck. MAKE SURE YOU:   Understand these instructions.  Will watch your condition.  Will get help right away if you are not doing well or get worse. Document Released: 07/17/2007 Document Revised: 04/22/2011 Document Reviewed: 06/04/2010 [facility] Patient Information 2014 [place],  [place].  Cough, Adult  A cough is a reflex. It helps you clear your throat and airways. A cough can help heal your body. A cough can last 2 or 3 weeks (acute) or may last more than 8 weeks (chronic). Some common causes of a cough can include an infection, allergy, or a cold. HOME CARE  Only take medicine as told by your doctor.  If given, take your medicines (antibiotics) as told. Finish them even if you start to feel better.  Use a cold steam vaporizer or humidier in your home. This can help loosen thick spit (secretions).  Sleep so you are almost sitting up (semi-upright). Use pillows to do this. This helps reduce coughing.  Rest as needed.  Stop smoking if you smoke. GET HELP RIGHT AWAY IF:  You have yellowish-white fluid (pus) in your thick spit.  Your cough gets worse.  Your medicine does not reduce coughing, and you are losing sleep.  You cough up blood.  You have trouble breathing.  Your pain gets worse and medicine does not help.  You have a fever. MAKE SURE YOU:   Understand these instructions.  Will watch your condition.  Will get help right away if you are not doing well or get worse. Document Released: 10/11/2010 Document Revised: 04/22/2011 Document Reviewed: 10/11/2010 [facility] Patient Information 2014 [place].

## 2013-07-17 NOTE — ED Notes (Signed)
Pt presents to department for evaluation of SOB, cough, and congestion. States she was recently discharged from hospital for CHF exacerbation. Now states she feels SOB. Respirations unlabored upon arrival. Denies chest pain. Pt is alert and oriented x4.

## 2013-07-17 NOTE — ED Provider Notes (Signed)
CSN: [id number]     Arrival date & time 07/17/13  1709 History   First MD Initiated Contact with Patient 07/17/13 2125     Chief Complaint  Patient presents with  . Cough  . Nasal Congestion    (Consider location/radiation/quality/duration/timing/severity/associated sxs/prior Treatment) HPI Comments: 63 year old female presents to the emergency department for cough. Patient states that she has been experiencing a cough for the last 4 days. Cough productive of clear/white phlegm. Cough associated with myalgias, nasal congestion, and rhinorrhea as well as a low-grade temperature of 100.69F prior to arrival. Patient has taken Tylenol for symptoms with mild to moderate relief. Patient denies any sick contacts, but does state she was recently discharged from the hospital on 07/12/2013 where she was admitted for a CHF exacerbation. Patient states she has been taking all of her medications since this time. She states that one of her new medications is listed up well and she has a family history of cough as an adverse reaction to this medicine. Patient denies associated syncope, lightheadedness, dizziness, chest pain, palpitations, shortness of breath, nausea or vomiting, numbness/tingling, and weakness.  The history is provided by the patient. No language interpreter was used.    Past Medical History  Diagnosis Date  . Medical history non-contributory   . CHF (congestive heart failure)    Past Surgical History  Procedure Laterality Date  . Cesarean section    . Tonsillectomy     Family History  Problem Relation Age of Onset  . Stroke Mother   . Hypertension Mother   . CAD Father   . Hypertension Father    History  Substance Use Topics  . Smoking status: Never Smoker   . Smokeless tobacco: Not on file  . Alcohol Use: No   OB History   Grav Para Term Preterm Abortions TAB SAB Ect Mult Living                  Review of Systems  Constitutional: Positive for fever (Tmax 100.69F).   HENT: Positive for congestion and rhinorrhea.   Respiratory: Positive for cough. Negative for shortness of breath and wheezing.   Cardiovascular: Negative for chest pain, palpitations and leg swelling.  Gastrointestinal: Negative for vomiting, abdominal pain and diarrhea.  Neurological: Negative for syncope, weakness, light-headedness and numbness.  All other systems reviewed and are negative.    Allergies  Codeine; Demerol; and Peanut-containing drug products  Home Medications   Prior to Admission medications   Medication Sig Start Date End Date Taking? Authorizing Provider  acetaminophen (TYLENOL) 500 MG tablet Take 500 mg by mouth every 6 (six) hours as needed (pain).    Historical Provider, MD  aspirin EC 81 MG EC tablet Take 1 tablet (81 mg total) by mouth daily. 07/13/13   Charlynne Cousins, MD  benzonatate (TESSALON) 100 MG capsule Take 2 capsules (200 mg total) by mouth 2 (two) times daily as needed for cough. 07/17/13   Antonietta Breach, PA-C  carvedilol (COREG) 12.5 MG tablet Take 1 tablet (12.5 mg total) by mouth 2 (two) times daily with a meal. 07/13/13   Charlynne Cousins, MD  diphenhydrAMINE (BENADRYL) 25 mg capsule Take 25 mg by mouth every 8 (eight) hours as needed for allergies.     Historical Provider, MD  docusate sodium (COLACE) 100 MG capsule Take 100 mg by mouth 3 (three) times daily as needed for mild constipation.    Historical Provider, MD  furosemide (LASIX) 40 MG tablet Take 80 mg in  the am and 40 pm for 3 days. Then 40 mg twice a day. 07/13/13   Charlynne Cousins, MD  levofloxacin (LEVAQUIN) 250 MG tablet Take 3 tablets (750 mg total) by mouth daily. Begin on 07/18/13 as you have been given your first dose in the ED. 07/17/13   Antonietta Breach, PA-C  lisinopril (PRINIVIL,ZESTRIL) 5 MG tablet Take 1 tablet (5 mg total) by mouth daily. 07/13/13   Charlynne Cousins, MD  Multiple Vitamin (MULTIVITAMIN WITH MINERALS) TABS tablet Take 1 tablet by mouth daily.    Historical Provider,  MD  potassium chloride SA (K-DUR,KLOR-CON) 20 MEQ tablet Take 1 tablet (20 mEq total) by mouth daily. 07/13/13   Charlynne Cousins, MD  spironolactone (ALDACTONE) 12.5 mg TABS tablet Take 0.5 tablets (12.5 mg total) by mouth daily. 07/13/13   Charlynne Cousins, MD   BP 151/90  Pulse 90  Temp(Src) 98.2 F (36.8 C) (Oral)  Resp 21  Ht 5\' 4"  (1.626 m)  Wt 334 lb (151.501 kg)  BMI 57.30 kg/m2  SpO2 99%  Physical Exam  Nursing note and vitals reviewed. Constitutional: She is oriented to person, place, and time. She appears well-developed and well-nourished. No distress.  Nontoxic/nonseptic appearing  HENT:  Head: Normocephalic and atraumatic.  Mouth/Throat: Oropharynx is clear and moist. No oropharyngeal exudate.  Eyes: Conjunctivae and EOM are normal. Pupils are equal, round, and reactive to light. No scleral icterus.  Neck: Normal range of motion. Neck supple.  No JVD. No stridor.  Cardiovascular: Normal rate, regular rhythm, normal heart sounds and intact distal pulses.   DP and PT pulses 2+ bilaterally  Pulmonary/Chest: Effort normal and breath sounds normal. No respiratory distress. She has no wheezes. She has no rales.  No tachypnea, dyspnea, retractions, or accessory muscle use  Abdominal: Soft. There is no tenderness.  Soft morbidly obese abdomen. No tenderness.  Musculoskeletal: Normal range of motion.  No pitting edema in bilateral lower extremities  Neurological: She is alert and oriented to person, place, and time.  GCS 15. Patient moves extremities without ataxia.  Skin: Skin is warm and dry. No rash noted. She is not diaphoretic. No erythema. No pallor.  Psychiatric: She has a normal mood and affect. Her behavior is normal.    ED Course  Procedures (including critical care time) Labs Review Labs Reviewed  CBC WITH DIFFERENTIAL - Abnormal; Notable for the following:    RDW 16.1 (*)    Lymphocytes Relative 6 (*)    Lymphs Abs 0.4 (*)    Monocytes Relative 18 (*)     Monocytes Absolute 1.2 (*)    All other components within normal limits  BASIC METABOLIC PANEL - Abnormal; Notable for the following:    Sodium 133 (*)    Potassium 3.6 (*)    Chloride 91 (*)    Glucose, Bld 111 (*)    Creatinine, Ser 1.11 (*)    GFR calc non Af Amer 53 (*)    GFR calc Af Amer 61 (*)    All other components within normal limits  PRO B NATRIURETIC PEPTIDE - Abnormal; Notable for the following:    Pro B Natriuretic peptide (BNP) 4574.0 (*)    All other components within normal limits  I-STAT TROPOININ, ED    Imaging Review Dg Chest 2 View  07/17/2013   CLINICAL DATA:  Shortness of breath and fever  EXAM: CHEST  2 VIEW  COMPARISON:  None.  FINDINGS: There is no edema or consolidation. Heart is  upper normal in size with normal pulmonary vascularity. No adenopathy. No bone lesions.  IMPRESSION: No edema or consolidation.   Electronically Signed   By: Lowella Grip M.D.   On: 07/17/2013 18:55     EKG Interpretation None      MDM   Final diagnoses:  Viral URI with cough    63 year old female presents to the emergency department today for cough with associated upper respiratory symptoms. She denies chest pain and shortness of breath. She endorses full compliance with her CHF regimen since discharge from the hospital on 07/12/2013. Patient does not have any signs of acute heart failure today. BNP is elevated compared to prior; however, patient denies any lower extremity swelling or orthopnea.  Given low-grade fever and recent hospital admission, will cover for pneumonia with Levaquin. No evidence of pneumonia on chest x-ray today. Doubt pulmonary embolism given lack of tachycardia, tachypnea, dyspnea, or hypoxia. Patient stable and appropriate for discharge instruction follow up with her cardiologist on Tuesday. Return precautions provided and patient agreeable to plan with no unaddressed concerns.   Filed Vitals:   07/17/13 1720 07/17/13 2139  BP: 159/100 151/90   Pulse: 99 90  Temp: 98.4 F (36.9 C) 98.2 F (36.8 C)  TempSrc: Oral Oral  Resp: 20 21  Height: 5\' 4"  (1.626 m)   Weight: 334 lb (151.501 kg)   SpO2: 96% 99%       Antonietta Breach, PA-C 07/18/13 0019

## 2013-07-17 NOTE — Telephone Encounter (Signed)
Make sure she follows up with me in CHF clinic.

## 2013-07-18 NOTE — ED Provider Notes (Signed)
Medical screening examination/treatment/procedure(s) were conducted as a shared visit with non-physician practitioner(s) and myself.  I personally evaluated the patient during the encounter.   EKG Interpretation None       Leota Jacobsen, MD 07/18/13 [phone]

## 2013-07-19 NOTE — Progress Notes (Signed)
Patient ID: Phyllis Todd, female   DOB: 1950-07-15, 63 y.o.   MRN: [id number]   PCP: Gita Kudo PA- [place]  HPI: Phyllis Todd is a 63 year old with a history of systolic heart failure, HTN, and NASH. Admitted to [facility] in 5/15 with increased dyspnea and edema and diagnosed wth new acute systolic heart failure.  Echo (5/15) showed EF 35-40% with moderately dilated RV.  Diuresed with IV lasix and later transitioned to lasix 40 mg po bid. She was placed on carvedilol, lisinopril, and spironolactone. She lost considerable weight in the hospital. She returned to [facility] ED 6/6 with cough and productive cough - white sputum. Treated for URI and prescribed Levofloxacin and benztonate.   She returns for follow up. Ongoing cough. Sleeping in recliner. Able to walk ok on flat surfaces. Weight at home trending down from 351 to 322 pounds. Taking all medications. Following low salt diet and limiting fluid intake to < 2 liters per day. Ambulated in the clinic and maintained oxygen saturations >92%.  She snores and has daytime sleepiness.   RHC/LHC 07/12/13 RA mean 26 RV 60/33 mean 44 CO 5.5 Fick 2.2 CI 3.1  Fick 1.3  Coronaries ok   Labs 07/13/13 K 4.7 Creatinine 1.28  Labs 07/17/13 K 3.6 Creatinine 1.1 Pro BNP 4574  ECG: NSR, anteroseptal Qs  SH: Lives with her parents in [place]. Does not smoke or drink alcohol  FH: Mother CVA        Father CAD, HTN  ROS: All systems negative except as listed in HPI, PMH and Problem List.  PMH: 1. HTN 2. Morbid obesity 3. Chronic systolic CHF: Nonischemic cardiomyopathy.  Echo (5/15) with EF 35-40%, moderate LAE, moderately dilated RV.  RHC/LHC (6/15) with mean RA 26, PA 61/33 mean 44, mean PCWP 24, CI 3.1, PVR 3.6 WU; simultaneous LV/RV catheters not suggestive of constriction; no CAD on coronary angiography.  4. ACEI cough 5. NASH: Abdominal US (5/15) with fatty liver versus cirrhosis.   Current Outpatient Prescriptions  Medication Sig Dispense Refill  .  acetaminophen (TYLENOL) 500 MG tablet Take 500 mg by mouth every 6 (six) hours as needed (pain).      Marland Kitchen aspirin EC 81 MG EC tablet Take 1 tablet (81 mg total) by mouth daily.      . benzonatate (TESSALON) 100 MG capsule Take 2 capsules (200 mg total) by mouth 2 (two) times daily as needed for cough.  20 capsule  0  . carvedilol (COREG) 12.5 MG tablet Take 1 tablet (12.5 mg total) by mouth 2 (two) times daily with a meal.  60 tablet  0  . diphenhydrAMINE (BENADRYL) 25 mg capsule Take 25 mg by mouth every 8 (eight) hours as needed for allergies.       Marland Kitchen docusate sodium (COLACE) 100 MG capsule Take 100 mg by mouth 3 (three) times daily as needed for mild constipation.      . furosemide (LASIX) 40 MG tablet Take 80 mg in the am and 40 pm for 3 days. Then 40 mg twice a day.  90 tablet  0  . levofloxacin (LEVAQUIN) 250 MG tablet Take 3 tablets (750 mg total) by mouth daily. Begin on 07/18/13 as you have been given your first dose in the ED.  12 tablet  0  . lisinopril (PRINIVIL,ZESTRIL) 5 MG tablet Take 1 tablet (5 mg total) by mouth daily.  30 tablet  0  . Multiple Vitamin (MULTIVITAMIN WITH MINERALS) TABS tablet Take 1 tablet  by mouth daily.      . potassium chloride SA (K-DUR,KLOR-CON) 20 MEQ tablet Take 1 tablet (20 mEq total) by mouth daily.  30 tablet  0  . spironolactone (ALDACTONE) 12.5 mg TABS tablet Take 0.5 tablets (12.5 mg total) by mouth daily.  30 tablet  0   No current facility-administered medications for this encounter.    PHYSICAL EXAM: Filed Vitals:   07/20/13 1019  BP: 137/58  Pulse: 94  Resp: 20  Weight: 322 lb 8 oz (146.285 kg)  SpO2: 95%    General:  Well appearing. No resp difficulty HEENT: normal Neck: Thick, JVP 10 cm,  Carotids 2+ bilaterally; no bruits. No lymphadenopathy or thryomegaly appreciated. Cor: PMI normal. Regular rate & rhythm. No rubs, gallops or murmurs. Lungs: clear Abdomen: soft, nontender, nondistended. No hepatosplenomegaly. No bruits or masses.  Good bowel sounds. Extremities: no cyanosis, clubbing, rash. 1+ chronic edema to knees bilaterally.  Neuro: alert & orientedx3, cranial nerves grossly intact. Moves all 4 extremities w/o difficulty. Affect pleasant.  ASSESSMENT & PLAN:  1. Chronic Systolic Heart Failure:  EF 35-40% with moderately dilated RV on 5/15 echo, nonischemic cardiomyopathy.  NYHA II-III. Weight down 29 pounds from discharge. She still has some volume overload but has been losing weight.  Possible viral myocarditis.  OHS/OSA may play a role in her right heart failure.  However, she was able to keep her oxygen saturation > 92% with ambulation today.   - Continue lasix 40 mg twice a day as she is losing weight at this dose.  - Continue carvedilol 12.5 twice a day and spironolactone 12.5 mg daily.  - Stop lisinopril due to cough and switch to losartan 50 mg daily.  - Check BMET ProBNP today and in 2 weeks.  - Reinforced daily weights, low salt diet, and limiting fluid intake to < 2 liters per day  2. HTN: BP controlled.  3. OSA: Snores--> Day time fatigue. Suspect OSA, will arrange sleep study.    Follow up in 2 weeks.   Amy D Clegg 10:21 AM  Patient seen with NP, agree with the above note.  Patient has nonischemic cardiomyopathy with prominent right heart failure.  Not sure OHS is present as she walked without desaturation but suspicious of OSA.  As above, transition from ACEI to ARB and continue current Lasix. Will arrange for sleep study.   [name] 07/21/2013

## 2013-07-19 NOTE — Telephone Encounter (Signed)
Addressed over weekend, pt sch for f/u 6/9

## 2013-07-19 NOTE — Telephone Encounter (Signed)
Pt sch for 6/9

## 2013-07-20 ENCOUNTER — Ambulatory Visit (HOSPITAL_COMMUNITY)
Admit: 2013-07-20 | Discharge: 2013-07-20 | Disposition: A | Discharge status: Home / Self Care | Payer: BC Managed Care – PPO | Attending: Internal Medicine | Admitting: Internal Medicine

## 2013-07-20 VITALS — BP 137/58 | HR 94 | Resp 20 | Wt 322.5 lb

## 2013-07-20 DIAGNOSIS — R0989 Other specified symptoms and signs involving the circulatory and respiratory systems: Secondary | ICD-10-CM | POA: Insufficient documentation

## 2013-07-20 DIAGNOSIS — R05 Cough: Secondary | ICD-10-CM | POA: Insufficient documentation

## 2013-07-20 DIAGNOSIS — G4733 Obstructive sleep apnea (adult) (pediatric): Secondary | ICD-10-CM

## 2013-07-20 DIAGNOSIS — R5383 Other fatigue: Secondary | ICD-10-CM | POA: Insufficient documentation

## 2013-07-20 DIAGNOSIS — R5381 Other malaise: Secondary | ICD-10-CM | POA: Insufficient documentation

## 2013-07-20 DIAGNOSIS — K7689 Other specified diseases of liver: Secondary | ICD-10-CM | POA: Insufficient documentation

## 2013-07-20 DIAGNOSIS — Z79899 Other long term (current) drug therapy: Secondary | ICD-10-CM | POA: Insufficient documentation

## 2013-07-20 DIAGNOSIS — I5022 Chronic systolic (congestive) heart failure: Secondary | ICD-10-CM

## 2013-07-20 DIAGNOSIS — R0609 Other forms of dyspnea: Secondary | ICD-10-CM | POA: Insufficient documentation

## 2013-07-20 DIAGNOSIS — I428 Other cardiomyopathies: Secondary | ICD-10-CM

## 2013-07-20 DIAGNOSIS — I10 Essential (primary) hypertension: Secondary | ICD-10-CM | POA: Insufficient documentation

## 2013-07-20 DIAGNOSIS — R059 Cough, unspecified: Secondary | ICD-10-CM | POA: Insufficient documentation

## 2013-07-20 LAB — BASIC METABOLIC PANEL
BUN: 19 mg/dL (ref 6–23)
CO2: 30 mEq/L (ref 19–32)
Calcium: 10.3 mg/dL (ref 8.4–10.5)
Chloride: 95 mEq/L — ABNORMAL LOW (ref 96–112)
Creatinine, Ser: 1.17 mg/dL — ABNORMAL HIGH (ref 0.50–1.10)
GFR calc Af Amer: 57 mL/min — ABNORMAL LOW (ref >90)
GFR calc non Af Amer: 50 mL/min — ABNORMAL LOW (ref >90)
Glucose, Bld: 111 mg/dL — ABNORMAL HIGH (ref 70–99)
Potassium: 4 mEq/L (ref 3.7–5.3)
Sodium: 137 mEq/L (ref 137–147)

## 2013-07-20 LAB — PRO B NATRIURETIC PEPTIDE: Pro B Natriuretic peptide (BNP): 3081 pg/mL — ABNORMAL HIGH (ref 0–125)

## 2013-07-20 MED ORDER — LOSARTAN POTASSIUM 50 MG PO TABS: 50.0000 mg | ORAL_TABLET | Freq: Every day | ORAL | Status: DC

## 2013-07-20 NOTE — Patient Instructions (Signed)
Follow up in 2 weeks  Stop lisinopril    Take losartan 50 mg daily   Do the following things EVERYDAY: 1) Weigh yourself in the morning before breakfast. Write it down and keep it in a log. 2) Take your medicines as prescribed 3) Eat low salt foods-Limit salt (sodium) to 2000 mg per day.  4) Stay as active as you can everyday 5) Limit all fluids for the day to less than 2 liters

## 2013-07-21 ENCOUNTER — Telehealth (HOSPITAL_COMMUNITY): Payer: Self-pay

## 2013-07-21 DIAGNOSIS — G4733 Obstructive sleep apnea (adult) (pediatric): Secondary | ICD-10-CM | POA: Insufficient documentation

## 2013-07-21 NOTE — Telephone Encounter (Signed)
Lab results reviewed with patient.  Aware and appreciative. Phyllis Todd

## 2013-07-26 ENCOUNTER — Telehealth (HOSPITAL_COMMUNITY): Payer: Self-pay | Admitting: Vascular Surgery

## 2013-07-26 MED ORDER — SPIRONOLACTONE 25 MG PO TABS: 25.0000 mg | ORAL_TABLET | Freq: Every day | ORAL | Status: DC

## 2013-07-26 NOTE — Telephone Encounter (Signed)
Pt needs refill Spironolactone 25mg  . Pharm [facility]

## 2013-07-26 NOTE — Telephone Encounter (Signed)
Per Junie Bame, NP ok to continue 25 mg as pt has been tolerating and labs stable, pt aware and agreeable, rx sent into pharmacy

## 2013-07-26 NOTE — Telephone Encounter (Signed)
Spoke w/pt she states she has been taking 25 mg daily, advised chart states take 1/2 tab daily, she went and look at her bottle which does states take 1/2 tab daily, however she states she has been taking 1 tab daily since she left the hospital on 6/2, she had labs on 6/9 which were normal, will verify with MD if pt should continue 25 mg daily or decrease back to 12.5 mg daily and call her back

## 2013-07-27 ENCOUNTER — Ambulatory Visit (INDEPENDENT_AMBULATORY_CARE_PROVIDER_SITE_OTHER): Payer: BC Managed Care – PPO | Admitting: Pulmonary Disease

## 2013-07-27 ENCOUNTER — Encounter: Payer: Self-pay | Admitting: Pulmonary Disease

## 2013-07-27 VITALS — BP 128/78 | HR 86 | Temp 97.7°F | Ht 64.0 in | Wt 307.8 lb

## 2013-07-27 DIAGNOSIS — G4733 Obstructive sleep apnea (adult) (pediatric): Secondary | ICD-10-CM

## 2013-07-27 NOTE — Progress Notes (Signed)
Subjective:    Patient ID: Phyllis Todd, female    DOB: [birth year], 63 y.o.   MRN: [id number]  HPI The patient is a 63 year old female who I've been asked to see for possible obstructive sleep apnea. Patient has had issues with frequent awakenings and nonrestorative sleep, but feels this has significantly improved since aggressive diuresis. Her orthopnea has also resolved. She currently does have snoring, but there are no observers on a consistent basis who can comment on an abnormal breathing pattern during sleep. The patient feels that she rarely awakens at this time, and is rested in the mornings upon arising. Despite this, she does have some sleep pressure during the day with inactivity, but does not feel that it impacts her quality of life. She also has some sleep pressure at night watching television, but no sleepiness with driving. The patient states that her weight is down 64 pounds recently after diuresis, and her Epworth score today is 6.   Sleep Questionnaire What time do you typically go to bed?( Between what hours) 10p-2AM 10p-2AM at 1513 on 07/27/13 by Inge Rise, CMA How long does it take you to fall asleep? 30 min 30 min at 1513 on 07/27/13 by Inge Rise, CMA How many times during the night do you wake up? 1 1 at 1513 on 07/27/13 by Inge Rise, [place] What time do you get out of bed to start your day? 0600 0600 at 1513 on 07/27/13 by Inge Rise, CMA Do you drive or operate heavy machinery in your occupation? No No at 1513 on 07/27/13 by Inge Rise, CMA How much has your weight changed (up or down) over the past two years? (In pounds) 64 lb 3.2 oz (29.121 kg) 64 lb 3.2 oz (29.121 kg) at 1513 on 07/27/13 by Inge Rise, CMA Have you ever had a sleep study before? No No at 1513 on 07/27/13 by Inge Rise, CMA Do you currently use CPAP? No No at 1513 on 07/27/13 by Inge Rise, CMA Do you wear oxygen at any time? No    Review of Systems    Constitutional: Positive for unexpected weight change. Negative for fever.  HENT: Negative for congestion, dental problem, ear pain, nosebleeds, postnasal drip, rhinorrhea, sinus pressure, sneezing, sore throat and trouble swallowing.   Eyes: Negative for redness and itching.  Respiratory: Positive for cough and shortness of breath. Negative for chest tightness and wheezing.   Cardiovascular: Positive for leg swelling. Negative for palpitations.  Gastrointestinal: Negative for nausea and vomiting.  Genitourinary: Negative for dysuria.  Musculoskeletal: Negative for joint swelling.  Skin: Negative for rash.  Neurological: Negative for headaches.  Hematological: Does not bruise/bleed easily.  Psychiatric/Behavioral: Negative for dysphoric mood. The patient is not nervous/anxious.        Objective:   Physical Exam Constitutional:  Morbidly obese female, no acute distress  HENT:  Nares patent without discharge  Oropharynx without exudate, palate and uvula are mildly elongated.   Eyes:  Perrla, eomi, no scleral icterus  Neck:  No JVD, no TMG  Cardiovascular:  Normal rate, regular rhythm, no rubs or gallops.  No murmurs        Intact distal pulses but very diminished.  Pulmonary :  Normal breath sounds, no stridor or respiratory distress   No rales, rhonchi, or wheezing  Abdominal:  Soft, nondistended, bowel sounds present.  No tenderness noted.   Musculoskeletal:  3+ lower extremity edema noted.  Lymph Nodes:  No  cervical lymphadenopathy noted  Skin:  No cyanosis noted  Neurologic:  Alert, appropriate, moves all 4 extremities without obvious deficit.         Assessment & Plan:

## 2013-07-27 NOTE — Assessment & Plan Note (Signed)
The patient is at significant risk for sleep disordered breathing given her morbid obesity, and has underlying comorbid illnesses that can be greatly affected by sleep disordered breathing. The patient feels that all of her symptoms were related to congestive heart failure, but she does still have some residual daytime sleepiness with inactivity. I have recommended that she had a sleep study to put the issue to arrest, and the patient is willing to do this if it is a home study.

## 2013-07-27 NOTE — Patient Instructions (Signed)
Will schedule for home sleep testing, and will call once the results are available. Work on weight reduction 

## 2013-07-28 ENCOUNTER — Encounter: Payer: Self-pay | Admitting: Adult Health

## 2013-07-28 ENCOUNTER — Ambulatory Visit (INDEPENDENT_AMBULATORY_CARE_PROVIDER_SITE_OTHER): Payer: BC Managed Care – PPO | Admitting: Adult Health

## 2013-07-28 VITALS — BP 128/82 | HR 74 | Temp 98.2°F | Resp 16 | Ht 63.5 in | Wt 300.8 lb

## 2013-07-28 DIAGNOSIS — Z7689 Persons encountering health services in other specified circumstances: Secondary | ICD-10-CM

## 2013-07-28 DIAGNOSIS — Z7189 Other specified counseling: Secondary | ICD-10-CM

## 2013-07-28 DIAGNOSIS — N179 Acute kidney failure, unspecified: Secondary | ICD-10-CM

## 2013-07-28 NOTE — Progress Notes (Signed)
Patient ID: Phyllis Todd, female   DOB: [birth year], 63 y.o.   MRN: [id number]   Subjective:    Patient ID: Phyllis Todd, female    DOB: [birth year], 63 y.o.   MRN: [id number]  HPI 63 y/o female with multiple medical problems including HTN, systolic/diastolic HF, NICM w/EF 02-77 on Echo (2015), Morbid obesity (BMI 52.43), OSA who presents to clinic to establish care with PCP. She has not had a PCP in > 17 years. Recent hospitalization at [facility] for CHF.  Pt was prescribed carvedilol bid when discharged from the hospital. She was under the impression that this was a daily medication. She feels that her medication is working well at present. Reports  "everything is fine and I do not want to mess anything up". "I will discuss with Dr. Aundra Dubin on Tuesday when I see him". Explained that medication was to help with heart failure, not just for blood pressure. She still wants to discuss with her cardiologist.  Kidney function is altered which is more than likely related to her HF. Discuss with pt that I want her to see a kidney specialist. She reports that Dr. Aundra Dubin "stays on top of this". Reluctant to see another specialist.   She reports that she is into herbal, alternative therapies. She wanted to know how I felt about them. Explained that these "herbs, etc" are not FDA regulated. You do not know what you are getting. Also, uncertain of interaction between these herbs and her prescription medication. Recommended that she not take any herbal supplements at this time. She agreed she would not.  Has not been to a PCP in > 17 years. Needs preventative screenings but wants to wait. Pt will need mammogram, pap, dexa, screening colonoscopy.    Past Medical History  Diagnosis Date  . Hypertension   . Acute combined systolic and diastolic heart failure 05/1285    Class 3 - ECHO EF 35-40%  . CHF (congestive heart failure) 06/2013  . NICM (nonischemic cardiomyopathy) 06/2013  . Other chronic nonalcoholic liver disease  09/6765  . Morbid obesity   . BMI 60.0-69.9, adult   . Obstructive sleep apnea (adult) (pediatric) 06/2013  . Other ascites 06/2013  . Acute renal failure      Past Surgical History  Procedure Laterality Date  . Cesarean section    . Tonsillectomy    . Dilation and curettage of uterus  1984  . Cardiac catheterization  2015     Family History  Problem Relation Age of Onset  . Breast cancer Maternal Grandmother   . Hypertension Mother   . Heart failure Mother   . Stroke Mother   . CAD Father   . Hypertension Father   . Stroke Brother 52  . Diabetes Brother   . Hypertension Brother      History   Social History  . Marital Status: Divorced    Spouse Name: N/A    Number of Children: 1  . Years of Education: 16   Occupational History  . 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 Smoking status: Never Smoker   . Smokeless tobacco: Not on file  . Alcohol Use: Yes     Comment: occas  . Drug Use: No  . Sexual Activity: Not on file   Other Topics Concern  . Not on file   Social History Narrative   Ellin grew up in [place], [place]. She attended [employer] and obtained her Bachelor's in  Literature. She is divorced and has 1 daughter Marliss Czar). Hobbies: crochet and gardening.     Current Outpatient Prescriptions on File Prior to Visit  Medication Sig Dispense Refill  . acetaminophen (TYLENOL) 500 MG tablet Take 500 mg by mouth every 6 (six) hours as needed (pain).      Marland Kitchen aspirin EC 81 MG EC tablet Take 1 tablet (81 mg total) by mouth daily.      . carvedilol (COREG) 12.5 MG tablet Take 1 tablet (12.5 mg total) by mouth 2 (two) times daily with a meal.  60 tablet  0  . diphenhydrAMINE (BENADRYL) 25 mg capsule Take 25 mg by mouth every 8 (eight) hours as needed for allergies.       Marland Kitchen docusate sodium (COLACE) 100 MG capsule Take 100 mg by mouth 3 (three) times daily as needed for mild constipation.      . furosemide (LASIX) 40 MG tablet Take 80 mg  in the am and 40 pm for 3 days. Then 40 mg twice a day.  90 tablet  0  . losartan (COZAAR) 50 MG tablet Take 1 tablet (50 mg total) by mouth daily.  30 tablet  6  . Multiple Vitamin (MULTIVITAMIN WITH MINERALS) TABS tablet Take 1 tablet by mouth daily.      . potassium chloride SA (K-DUR,KLOR-CON) 20 MEQ tablet Take 1 tablet (20 mEq total) by mouth daily.  30 tablet  0  . spironolactone (ALDACTONE) 25 MG tablet Take 1 tablet (25 mg total) by mouth daily.  30 tablet  3   No current facility-administered medications on file prior to visit.     Review of Systems  Constitutional: Negative.   Respiratory: Negative for cough, chest tightness, shortness of breath and wheezing.   Cardiovascular: Positive for leg swelling. Negative for chest pain and palpitations.  Gastrointestinal: Negative for abdominal pain and constipation.  Neurological: Negative.   Psychiatric/Behavioral: Negative.   All other systems reviewed and are negative.      Objective:  BP 128/82  Pulse 74  Temp(Src) 98.2 F (36.8 C) (Oral)  Resp 16  Ht 5' 3.5" (1.613 m)  Wt 300 lb 12 oz (136.419 kg)  BMI 52.43 kg/m2  SpO2 96%   Physical Exam  Constitutional: She is oriented to person, place, and time. No distress.  Obese 63 y/o female in NAD  HENT:  Head: Normocephalic and atraumatic.  Eyes: Conjunctivae and EOM are normal.  Neck: Normal range of motion. Neck supple.  Cardiovascular: Normal rate, regular rhythm and normal heart sounds.  Exam reveals no gallop and no friction rub.   No murmur heard. Pulmonary/Chest: Effort normal and breath sounds normal. No respiratory distress. She has no wheezes. She has no rales.  Musculoskeletal: Normal range of motion. She exhibits edema.  2+ edema bilateral lower extremities  Neurological: She is alert and oriented to person, place, and time. She has normal reflexes. Coordination normal.  Skin: Skin is warm and dry.  Psychiatric: She has a normal mood and affect. Her behavior  is normal. Judgment and thought content normal.      Assessment & Plan:   1. Acute renal failure Recent episode of CHF requiring hospitalization. Echo shows EF 35-40%. Creatinine 1.17 currently. Has been as high as 1.43. I would like her to see a Nephrologist. Discussed that this is precautionary. She was agreeable. She has a follow up appt with her Cardiologist 08/03/13.  - Ambulatory referral to Nephrology  2. Encounter to establish care Reviewed records  from other providers, hospitalization, history. Pt has not seen a PCP in greater than 17 years and has not had many of the preventative screenings. She wants to wait until she is more stable from her cardiac stand point prior to ordering these. Note, greater than 45 min were spent in face to face communication in reviewing other provider's records, history, assessment, evaluation, planning and implementation of care.

## 2013-07-28 NOTE — Progress Notes (Signed)
Pre visit review using our clinic review tool, if applicable. No additional management support is needed unless otherwise documented below in the visit note. 

## 2013-07-28 NOTE — Patient Instructions (Signed)
  I am referring you to a kidney specialist.  We will contact you for an appointment.  We will address all preventative tests and screenings once you are more stable from a cardiac standpoint.

## 2013-07-31 ENCOUNTER — Encounter: Payer: Self-pay | Admitting: Adult Health

## 2013-07-31 DIAGNOSIS — N179 Acute kidney failure, unspecified: Secondary | ICD-10-CM | POA: Insufficient documentation

## 2013-08-03 ENCOUNTER — Ambulatory Visit (HOSPITAL_COMMUNITY)
Admission: RE | Admit: 2013-08-03 | Discharge: 2013-08-03 | Disposition: A | Discharge status: Home / Self Care | Payer: BC Managed Care – PPO | Source: Ambulatory Visit | Attending: Cardiology | Admitting: Cardiology

## 2013-08-03 ENCOUNTER — Encounter (HOSPITAL_COMMUNITY): Payer: Self-pay

## 2013-08-03 VITALS — BP 128/88 | HR 80 | Wt 297.1 lb

## 2013-08-03 DIAGNOSIS — I509 Heart failure, unspecified: Secondary | ICD-10-CM

## 2013-08-03 DIAGNOSIS — Z8249 Family history of ischemic heart disease and other diseases of the circulatory system: Secondary | ICD-10-CM | POA: Insufficient documentation

## 2013-08-03 DIAGNOSIS — I517 Cardiomegaly: Secondary | ICD-10-CM | POA: Insufficient documentation

## 2013-08-03 DIAGNOSIS — I10 Essential (primary) hypertension: Secondary | ICD-10-CM | POA: Insufficient documentation

## 2013-08-03 DIAGNOSIS — Z79899 Other long term (current) drug therapy: Secondary | ICD-10-CM | POA: Insufficient documentation

## 2013-08-03 DIAGNOSIS — Z823 Family history of stroke: Secondary | ICD-10-CM | POA: Insufficient documentation

## 2013-08-03 DIAGNOSIS — I2589 Other forms of chronic ischemic heart disease: Secondary | ICD-10-CM | POA: Insufficient documentation

## 2013-08-03 DIAGNOSIS — G4733 Obstructive sleep apnea (adult) (pediatric): Secondary | ICD-10-CM | POA: Insufficient documentation

## 2013-08-03 DIAGNOSIS — I5022 Chronic systolic (congestive) heart failure: Secondary | ICD-10-CM | POA: Insufficient documentation

## 2013-08-03 DIAGNOSIS — I5041 Acute combined systolic (congestive) and diastolic (congestive) heart failure: Secondary | ICD-10-CM

## 2013-08-03 MED ORDER — LOSARTAN POTASSIUM 50 MG PO TABS: ORAL_TABLET | ORAL | Status: DC

## 2013-08-03 NOTE — Progress Notes (Signed)
Patient ID: Phyllis Todd, female   DOB: 05-07-1950, 63 y.o.   MRN: [id number]  PCP: Phyllis Bruce PA- [facility]  HPI: Phyllis Todd is a 63 year old with a history of systolic heart failure, HTN, and NASH. Admitted to [facility] in 5/15 with increased dyspnea and edema and diagnosed wth new acute systolic heart failure.  Echo (5/15) showed EF 35-40% with moderately dilated RV.  Diuresed with IV lasix and later transitioned to lasix 40 mg po bid. She was placed on carvedilol, lisinopril, and spironolactone. She lost considerable weight in the [facility]. She returned to [facility] 6/6 with cough and productive cough - white sputum. Treated for URI and prescribed Levofloxacin and benztonate.   She returns for follow up. She has been doing better.  She has lost another 25 lbs.  Creatinine has remained stable.  She walks on a treadmill for 10 minutes without dyspnea.  She was able to walk around [employer] recently without trouble.  No chest pain, palpitations, lightheadedness, orthopnea/PND.  She has only been taking her Coreg once a day.   RHC/LHC 07/12/13 RA mean 26 RV 60/33 mean 44 CO 5.5 Fick 2.2 CI 3.1  Fick 1.3  Coronaries ok   Labs 07/13/13 K 4.7 Creatinine 1.28  Labs 07/17/13 K 3.6 => 4 Creatinine 1.1 => 1.17 Pro BNP 4574 => 3081  ECG: NSR, anteroseptal Qs  SH: Lives with her parents in [place]. Does not smoke or drink alcohol  FH: Mother CVA        Father CAD, HTN  ROS: All systems negative except as listed in HPI, PMH and Problem List.  PMH: 1. HTN 2. Morbid obesity 3. Chronic systolic CHF: Nonischemic cardiomyopathy.  Echo (5/15) with EF 35-40%, moderate LAE, moderately dilated RV.  RHC/LHC (6/15) with mean RA 26, PA 61/33 mean 44, mean PCWP 24, CI 3.1, PVR 3.6 WU; simultaneous LV/RV catheters not suggestive of constriction; no CAD on coronary angiography.  4. ACEI cough 5. NASH: Abdominal US (5/15) with fatty liver versus cirrhosis.   Current Outpatient Prescriptions  Medication Sig  Dispense Refill  . acetaminophen (TYLENOL) 500 MG tablet Take 500 mg by mouth every 6 (six) hours as needed (pain).      Marland Kitchen aspirin EC 81 MG EC tablet Take 1 tablet (81 mg total) by mouth daily.      . carvedilol (COREG) 12.5 MG tablet Take 1 tablet (12.5 mg total) by mouth 2 (two) times daily with a meal.  60 tablet  0  . diphenhydrAMINE (BENADRYL) 25 mg capsule Take 25 mg by mouth every 8 (eight) hours as needed for allergies.       Marland Kitchen docusate sodium (COLACE) 100 MG capsule Take 100 mg by mouth 3 (three) times daily as needed for mild constipation.      . furosemide (LASIX) 40 MG tablet Take 80 mg in the am and 40 pm for 3 days. Then 40 mg twice a day.  90 tablet  0  . losartan (COZAAR) 50 MG tablet Take one tab in the AM (50mg ) and 1/2 tab in the PM (25mg )  45 tablet  6  . Multiple Vitamin (MULTIVITAMIN WITH MINERALS) TABS tablet Take 1 tablet by mouth daily.      . potassium chloride SA (K-DUR,KLOR-CON) 20 MEQ tablet Take 1 tablet (20 mEq total) by mouth daily.  30 tablet  0  . spironolactone (ALDACTONE) 25 MG tablet Take 1 tablet (25 mg total) by mouth daily.  30 tablet  3  No current Todd-administered medications for this encounter.    PHYSICAL EXAM: Filed Vitals:   08/03/13 1531  BP: 128/88  Pulse: 80  Weight: 297 lb 1.9 oz (134.773 kg)  SpO2: 93%    General:  Well appearing. No resp difficulty HEENT: normal Neck: Thick, JVP not elevated,  Carotids 2+ bilaterally; no bruits. No lymphadenopathy or thryomegaly appreciated. Cor: PMI normal. Regular rate & rhythm. No rubs, gallops or murmurs. Lungs: clear Abdomen: soft, nontender, nondistended. No hepatosplenomegaly. No bruits or masses. Good bowel sounds. Extremities: no cyanosis, clubbing, rash. 1+ chronic ankle edema bilaterally.  Neuro: alert & orientedx3, cranial nerves grossly intact. Moves all 4 extremities w/o difficulty. Affect pleasant.  ASSESSMENT & PLAN:  1. Chronic Systolic Heart Failure:  EF 35-40% with  moderately dilated RV on 5/15 echo, nonischemic cardiomyopathy.  NYHA II. Weight down another 25 lbs. Volume looks much better overall.  Possible viral myocarditis.  OHS/OSA may play a role in her right heart failure.  However, she was able to keep her oxygen saturation > 92% with ambulation at last appointment.   - Continue lasix 40 mg twice a day.  - Increase Coreg from once daily to twice daily.  - Increase losartan to 50 qam, 25 qpm with BMET/BNP in 1 week.  - Continue spironolactone 25 mg daily.  - Plan to repeat echo at 6 months (11/15).  - Reinforced daily weights, low salt diet, and limiting fluid intake to < 2 liters per day  2. HTN: BP controlled.  3. OSA: Snores--> Day time fatigue. Suspect OSA, will be doing a home sleep study.    Followup in 1 month.   Phyllis Todd 08/03/2013

## 2013-08-03 NOTE — Patient Instructions (Signed)
CONTINUE Carvedilol 12.5mg  twice a day INCREASE Losartan 50mg  in the AM and 25mg  in the PM  Labs needed in one week (BMET,BNP)  Your physician recommends that you schedule a follow-up appointment in: 1 month  Do the following things EVERYDAY: 1) Weigh yourself in the morning before breakfast. Write it down and keep it in a log. 2) Take your medicines as prescribed 3) Eat low salt foods-Limit salt (sodium) to 2000 mg per day.  4) Stay as active as you can everyday 5) Limit all fluids for the day to less than 2 liters 6)

## 2013-08-18 ENCOUNTER — Telehealth: Payer: Self-pay | Admitting: Pulmonary Disease

## 2013-08-18 NOTE — Telephone Encounter (Signed)
Will forward to KC  

## 2013-08-23 ENCOUNTER — Telehealth (HOSPITAL_COMMUNITY): Payer: Self-pay | Admitting: Vascular Surgery

## 2013-08-23 DIAGNOSIS — I509 Heart failure, unspecified: Secondary | ICD-10-CM

## 2013-08-23 MED ORDER — CARVEDILOL 12.5 MG PO TABS: 12.5000 mg | ORAL_TABLET | Freq: Two times a day (BID) | ORAL | Status: DC

## 2013-08-23 MED ORDER — FUROSEMIDE 40 MG PO TABS: 40.0000 mg | ORAL_TABLET | Freq: Two times a day (BID) | ORAL | Status: DC

## 2013-08-23 NOTE — Telephone Encounter (Signed)
Refill... Furosemide and Carvedilol

## 2013-08-23 NOTE — Telephone Encounter (Signed)
rx refills sent to pts pharmacy Pt aware

## 2013-08-30 ENCOUNTER — Telehealth (HOSPITAL_COMMUNITY): Payer: Self-pay | Admitting: Vascular Surgery

## 2013-08-30 DIAGNOSIS — I509 Heart failure, unspecified: Secondary | ICD-10-CM

## 2013-08-30 MED ORDER — SPIRONOLACTONE 25 MG PO TABS: 25.0000 mg | ORAL_TABLET | Freq: Every day | ORAL | Status: DC

## 2013-08-30 NOTE — Telephone Encounter (Signed)
Pt is out of refills on Spironolactone.Marland KitchenMarland Kitchen

## 2013-08-30 NOTE — Telephone Encounter (Signed)
Refills sent to pharm Detailed message rx sent

## 2013-09-01 ENCOUNTER — Ambulatory Visit (HOSPITAL_COMMUNITY)
Admission: RE | Admit: 2013-09-01 | Discharge: 2013-09-01 | Disposition: A | Discharge status: Home / Self Care | Payer: BC Managed Care – PPO | Source: Ambulatory Visit | Attending: Cardiology | Admitting: Cardiology

## 2013-09-01 ENCOUNTER — Encounter (HOSPITAL_COMMUNITY): Payer: Self-pay

## 2013-09-01 VITALS — BP 108/78 | HR 75 | Wt 297.0 lb

## 2013-09-01 DIAGNOSIS — Z7982 Long term (current) use of aspirin: Secondary | ICD-10-CM | POA: Insufficient documentation

## 2013-09-01 DIAGNOSIS — G4733 Obstructive sleep apnea (adult) (pediatric): Secondary | ICD-10-CM

## 2013-09-01 DIAGNOSIS — I509 Heart failure, unspecified: Secondary | ICD-10-CM

## 2013-09-01 DIAGNOSIS — I5041 Acute combined systolic (congestive) and diastolic (congestive) heart failure: Secondary | ICD-10-CM

## 2013-09-01 DIAGNOSIS — Z09 Encounter for follow-up examination after completed treatment for conditions other than malignant neoplasm: Secondary | ICD-10-CM | POA: Insufficient documentation

## 2013-09-01 DIAGNOSIS — I10 Essential (primary) hypertension: Secondary | ICD-10-CM | POA: Insufficient documentation

## 2013-09-01 DIAGNOSIS — I5022 Chronic systolic (congestive) heart failure: Secondary | ICD-10-CM

## 2013-09-01 DIAGNOSIS — Z79899 Other long term (current) drug therapy: Secondary | ICD-10-CM | POA: Insufficient documentation

## 2013-09-01 DIAGNOSIS — Z8249 Family history of ischemic heart disease and other diseases of the circulatory system: Secondary | ICD-10-CM | POA: Insufficient documentation

## 2013-09-01 DIAGNOSIS — I428 Other cardiomyopathies: Secondary | ICD-10-CM | POA: Insufficient documentation

## 2013-09-01 DIAGNOSIS — I2510 Atherosclerotic heart disease of native coronary artery without angina pectoris: Secondary | ICD-10-CM | POA: Insufficient documentation

## 2013-09-01 LAB — BASIC METABOLIC PANEL
Anion gap: 14 (ref 5–15)
BUN: 30 mg/dL — ABNORMAL HIGH (ref 6–23)
CO2: 28 mEq/L (ref 19–32)
Calcium: 10.5 mg/dL (ref 8.4–10.5)
Chloride: 97 mEq/L (ref 96–112)
Creatinine, Ser: 1.43 mg/dL — ABNORMAL HIGH (ref 0.50–1.10)
GFR calc Af Amer: 45 mL/min — ABNORMAL LOW (ref >90)
GFR calc non Af Amer: 39 mL/min — ABNORMAL LOW (ref >90)
Glucose, Bld: 102 mg/dL — ABNORMAL HIGH (ref 70–99)
Potassium: 4.7 mEq/L (ref 3.7–5.3)
Sodium: 139 mEq/L (ref 137–147)

## 2013-09-01 MED ORDER — SPIRONOLACTONE 25 MG PO TABS: 25.0000 mg | ORAL_TABLET | Freq: Every day | ORAL | Status: DC

## 2013-09-01 MED ORDER — LOSARTAN POTASSIUM 50 MG PO TABS: 50.0000 mg | ORAL_TABLET | Freq: Two times a day (BID) | ORAL | Status: DC

## 2013-09-01 MED ORDER — POTASSIUM CHLORIDE CRYS ER 20 MEQ PO TBCR: 20.0000 meq | EXTENDED_RELEASE_TABLET | Freq: Every day | ORAL | Status: DC

## 2013-09-01 NOTE — Progress Notes (Signed)
Patient ID: Phyllis Todd, female   DOB: [birth year], 63 y.o.   MRN: [id number]  PCP: Hassie Bruce PA- Velora Heckler  HPI: Phyllis Todd is a 63 year old with a history of systolic heart failure, HTN, and NASH. Admitted to [facility] in 5/15 with increased dyspnea and edema and diagnosed wth new acute systolic heart failure.  Echo (5/15) showed EF 35-40% with moderately dilated RV.  Diuresed with IV lasix and later transitioned to lasix 40 mg po bid. She was placed on carvedilol, lisinopril, and spironolactone. She lost considerable weight in the hospital.   She returns for follow up. She has been doing better.  Weight stable.  She walks on a treadmill for 10 minutes without dyspnea.  She is able to walk around [employer] recently without trouble.  She can walk about 100 yards without stopping.  No chest pain, palpitations, lightheadedness, orthopnea/PND.  Still has not had sleep study, says she cannot afford it at this time.   RHC/LHC 07/12/13 RA mean 26 RV 60/33 mean 44 CO 5.5 Fick 2.2 CI 3.1  Fick 1.3  Coronaries ok   Labs 07/13/13 K 4.7 Creatinine 1.28  Labs 07/17/13 K 3.6 => 4 Creatinine 1.1 => 1.17 Pro BNP 4574 => 3081  SH: Lives with Phyllis Todd parents in [place]. Does not smoke or drink alcohol  FH: Mother CVA        Father CAD, HTN  ROS: All systems negative except as listed in HPI, PMH and Problem List.  PMH: 1. HTN 2. Morbid obesity 3. Chronic systolic CHF: Nonischemic cardiomyopathy.  Echo (5/15) with EF 35-40%, moderate LAE, moderately dilated RV.  RHC/LHC (6/15) with mean RA 26, PA 61/33 mean 44, mean PCWP 24, CI 3.1, PVR 3.6 WU; simultaneous LV/RV catheters not suggestive of constriction; no CAD on coronary angiography.  4. ACEI cough 5. NASH: Abdominal US (5/15) with fatty liver versus cirrhosis.   Current Outpatient Prescriptions  Medication Sig Dispense Refill  . acetaminophen (TYLENOL) 500 MG tablet Take 500 mg by mouth every 6 (six) hours as needed (pain).      Marland Kitchen aspirin EC 81 MG EC  tablet Take 1 tablet (81 mg total) by mouth daily.      . carvedilol (COREG) 12.5 MG tablet Take 1 tablet (12.5 mg total) by mouth 2 (two) times daily with a meal.  60 tablet  6  . diphenhydrAMINE (BENADRYL) 25 mg capsule Take 25 mg by mouth every 8 (eight) hours as needed for allergies.       Marland Kitchen docusate sodium (COLACE) 100 MG capsule Take 100 mg by mouth 3 (three) times daily as needed for mild constipation.      . furosemide (LASIX) 40 MG tablet Take 1 tablet (40 mg total) by mouth 2 (two) times daily.  60 tablet  6  . losartan (COZAAR) 50 MG tablet Take 1 tablet (50 mg total) by mouth 2 (two) times daily.  60 tablet  6  . Multiple Vitamin (MULTIVITAMIN WITH MINERALS) TABS tablet Take 1 tablet by mouth daily.      . potassium chloride SA (K-DUR,KLOR-CON) 20 MEQ tablet Take 1 tablet (20 mEq total) by mouth daily.  30 tablet  6  . spironolactone (ALDACTONE) 25 MG tablet Take 1 tablet (25 mg total) by mouth daily.  30 tablet  6   No current facility-administered medications for this encounter.    PHYSICAL EXAM: Filed Vitals:   09/01/13 1040  BP: 108/78  Pulse: 75  Weight: 297 lb (134.718  kg)  SpO2: 98%    General:  Well appearing. No resp difficulty HEENT: normal Neck: Thick, JVP not elevated,  Carotids 2+ bilaterally; no bruits. No lymphadenopathy or thryomegaly appreciated. Cor: PMI normal. Regular rate & rhythm. No rubs, gallops or murmurs. Lungs: clear Abdomen: soft, nontender, nondistended. No hepatosplenomegaly. No bruits or masses. Good bowel sounds. Extremities: no cyanosis, clubbing, rash. 1+ chronic ankle edema bilaterally.  Neuro: alert & orientedx3, cranial nerves grossly intact. Moves all 4 extremities w/o difficulty. Affect pleasant.  ASSESSMENT & PLAN:  1. Chronic Systolic Heart Failure:  EF 35-40% with moderately dilated RV on 5/15 echo, nonischemic cardiomyopathy.  NYHA II. Weight stable. Volume looks much better overall.  Possible viral myocarditis.  OHS/OSA may play  a role in [facility] right heart failure.  However, she was able to keep [facility] oxygen saturation > 92% with ambulation at a prior appointment.   - Continue lasix 40 mg twice a day.  - Continue current Coreg and spironolactone. - Increase losartan to 50 mg bid with BMET/BNP in 1 week.  - Plan to repeat echo at 6 months (11/15).  - Reinforced daily weights, low salt diet, and limiting fluid intake to < 2 liters per day  2. HTN: BP controlled.  3. OSA: Snores--> Day time fatigue. Suspect OSA but finances will not allow a sleep study yet.  Hope to get in the near future.    Followup in  2 months.   [name] 09/01/2013

## 2013-09-01 NOTE — Patient Instructions (Signed)
Increase Losartan to 50 mg Twice daily   Labs today and in 2 weeks (bmet)  Your physician recommends that you schedule a follow-up appointment in: 2 months

## 2013-09-06 ENCOUNTER — Telehealth (HOSPITAL_COMMUNITY): Payer: Self-pay

## 2013-09-06 DIAGNOSIS — I5041 Acute combined systolic (congestive) and diastolic (congestive) heart failure: Secondary | ICD-10-CM

## 2013-09-06 MED ORDER — LOSARTAN POTASSIUM 50 MG PO TABS: ORAL_TABLET | ORAL | Status: DC

## 2013-09-06 NOTE — Telephone Encounter (Signed)
Lab results reviewed with patient, instructed to reduce losartan dose back to 50mg  in am and 25 mg in pm.  Prefers to keep 50mg  tablets.  Will have PCP check labs next week and fax to [place]. Renee Pain

## 2013-09-14 ENCOUNTER — Ambulatory Visit (INDEPENDENT_AMBULATORY_CARE_PROVIDER_SITE_OTHER): Payer: BC Managed Care – PPO

## 2013-09-14 DIAGNOSIS — I5041 Acute combined systolic (congestive) and diastolic (congestive) heart failure: Secondary | ICD-10-CM

## 2013-09-14 DIAGNOSIS — I509 Heart failure, unspecified: Secondary | ICD-10-CM

## 2013-09-15 LAB — BRAIN NATRIURETIC PEPTIDE: BNP: 18.9 pg/mL (ref 0.0–100.0)

## 2013-10-25 ENCOUNTER — Telehealth (HOSPITAL_COMMUNITY): Payer: Self-pay | Admitting: Vascular Surgery

## 2013-10-25 DIAGNOSIS — I509 Heart failure, unspecified: Secondary | ICD-10-CM

## 2013-10-25 MED ORDER — CARVEDILOL 12.5 MG PO TABS: 12.5000 mg | ORAL_TABLET | Freq: Two times a day (BID) | ORAL | Status: DC

## 2013-10-25 NOTE — Telephone Encounter (Signed)
As requested rx sent to [facility]

## 2013-10-25 NOTE — Telephone Encounter (Signed)
New prescription Carvedilol her [facility] in [place] is out of the medication and they don't know when it would be back in stock... She would like it to be sent to [facility] [phone]

## 2013-11-02 ENCOUNTER — Encounter (HOSPITAL_COMMUNITY): Payer: Self-pay

## 2013-11-02 ENCOUNTER — Ambulatory Visit (HOSPITAL_COMMUNITY)
Admission: RE | Admit: 2013-11-02 | Discharge: 2013-11-02 | Disposition: A | Discharge status: Home / Self Care | Payer: BC Managed Care – PPO | Source: Ambulatory Visit | Attending: Cardiology | Admitting: Cardiology

## 2013-11-02 VITALS — BP 129/79 | HR 72 | Wt 302.0 lb

## 2013-11-02 DIAGNOSIS — K7689 Other specified diseases of liver: Secondary | ICD-10-CM | POA: Diagnosis not present

## 2013-11-02 DIAGNOSIS — I428 Other cardiomyopathies: Secondary | ICD-10-CM | POA: Insufficient documentation

## 2013-11-02 DIAGNOSIS — G4733 Obstructive sleep apnea (adult) (pediatric): Secondary | ICD-10-CM

## 2013-11-02 DIAGNOSIS — I10 Essential (primary) hypertension: Secondary | ICD-10-CM | POA: Diagnosis not present

## 2013-11-02 DIAGNOSIS — I5022 Chronic systolic (congestive) heart failure: Secondary | ICD-10-CM

## 2013-11-02 DIAGNOSIS — N179 Acute kidney failure, unspecified: Secondary | ICD-10-CM | POA: Diagnosis not present

## 2013-11-02 DIAGNOSIS — I509 Heart failure, unspecified: Secondary | ICD-10-CM | POA: Diagnosis not present

## 2013-11-02 LAB — BASIC METABOLIC PANEL
Anion gap: 14 (ref 5–15)
BUN: 30 mg/dL — ABNORMAL HIGH (ref 6–23)
CO2: 28 mEq/L (ref 19–32)
Calcium: 10.2 mg/dL (ref 8.4–10.5)
Chloride: 98 mEq/L (ref 96–112)
Creatinine, Ser: 1.63 mg/dL — ABNORMAL HIGH (ref 0.50–1.10)
GFR calc Af Amer: 38 mL/min — ABNORMAL LOW (ref >90)
GFR calc non Af Amer: 33 mL/min — ABNORMAL LOW (ref >90)
Glucose, Bld: 95 mg/dL (ref 70–99)
Potassium: 4.6 mEq/L (ref 3.7–5.3)
Sodium: 140 mEq/L (ref 137–147)

## 2013-11-02 MED ORDER — CARVEDILOL 12.5 MG PO TABS: 18.7500 mg | ORAL_TABLET | Freq: Two times a day (BID) | ORAL | Status: DC

## 2013-11-02 NOTE — Patient Instructions (Signed)
Increase carvedilol/coreg to 18.75mg  (1.5 tablets) twice daily.  Follow up with Dr. Aundra Dubin in 2 months with an echocardiogram.  Do the following things EVERYDAY: 1) Weigh yourself in the morning before breakfast. Write it down and keep it in a log. 2) Take your medicines as prescribed 3) Eat low salt foods-Limit salt (sodium) to 2000 mg per day.  4) Stay as active as you can everyday 5) Limit all fluids for the day to less than 2 liters

## 2013-11-02 NOTE — Progress Notes (Signed)
Patient ID: Phyllis Todd, female   DOB: 31-Dec-1950, 63 y.o.   MRN: [id number] PCP: Hassie Bruce PA- Velora Heckler  HPI: Mrs Phyllis Todd is a 63 year old with a history of systolic heart failure, HTN, and NASH. Admitted to [facility] in 5/15 with increased dyspnea and edema and diagnosed wth new acute systolic heart failure.  Echo (5/15) showed EF 35-40% with moderately dilated RV.  Diuresed with IV lasix and later transitioned to lasix 40 mg po bid. She was placed on carvedilol, losartan, and spironolactone. She lost considerable weight in the hospital.  At last visit, I increased her losartan but had to go back to her prior dose given increase in creatinine.    She returns for follow up. She has been doing better.  Weight is up a little; she admits to eating more and less healthily.  She walks on a treadmill for 10 minutes without dyspnea.  She can walk about 100 yards without stopping.  No chest pain, palpitations, lightheadedness, orthopnea/PND.  Still has not had sleep study, says she cannot afford it at this time.   RHC/LHC 07/12/13 RA mean 26 RV 60/33 mean 44 CO 5.5 Fick 2.2 CI 3.1  Fick 1.3  Coronaries ok   Labs 07/13/13 K 4.7 Creatinine 1.28  Labs 07/17/13 K 3.6 => 4 Creatinine 1.1 => 1.17 Pro BNP 4574 => 3081 Labs 7/15 K 4.7, creatinine 1.43 Labs 8/15 BNP 18.9  SH: Lives with her parents in [place]. Does not smoke or drink alcohol  FH: Mother CVA        Father CAD, HTN  ROS: All systems negative except as listed in HPI, PMH and Problem List.  PMH: 1. HTN 2. Morbid obesity 3. Chronic systolic CHF: Nonischemic cardiomyopathy.  Echo (5/15) with EF 35-40%, moderate LAE, moderately dilated RV.  RHC/LHC (6/15) with mean RA 26, PA 61/33 mean 44, mean PCWP 24, CI 3.1, PVR 3.6 WU; simultaneous LV/RV catheters not suggestive of constriction; no CAD on coronary angiography.  4. ACEI cough 5. NASH: Abdominal US (5/15) with fatty liver versus cirrhosis.   Current Outpatient Prescriptions  Medication  Sig Dispense Refill  . acetaminophen (TYLENOL) 500 MG tablet Take 500 mg by mouth every 6 (six) hours as needed (pain).      Marland Kitchen aspirin EC 81 MG EC tablet Take 1 tablet (81 mg total) by mouth daily.      . carvedilol (COREG) 12.5 MG tablet Take 1.5 tablets (18.75 mg total) by mouth 2 (two) times daily with a meal.  180 tablet  6  . diphenhydrAMINE (BENADRYL) 25 mg capsule Take 25 mg by mouth every 8 (eight) hours as needed for allergies.       Marland Kitchen docusate sodium (COLACE) 100 MG capsule Take 100 mg by mouth 3 (three) times daily as needed for mild constipation.      . furosemide (LASIX) 40 MG tablet Take 1 tablet (40 mg total) by mouth 2 (two) times daily.  60 tablet  6  . losartan (COZAAR) 50 MG tablet Take 75 mg by mouth daily.      . Multiple Vitamin (MULTIVITAMIN WITH MINERALS) TABS tablet Take 1 tablet by mouth daily.      . potassium chloride SA (K-DUR,KLOR-CON) 20 MEQ tablet Take 1 tablet (20 mEq total) by mouth daily.  30 tablet  6  . spironolactone (ALDACTONE) 25 MG tablet Take 1 tablet (25 mg total) by mouth daily.  30 tablet  6   No current facility-administered medications for  this encounter.    PHYSICAL EXAM: Filed Vitals:   11/02/13 1332  BP: 129/79  Pulse: 72  Weight: 302 lb (136.986 kg)  SpO2: 92%    General:  Well appearing. No resp difficulty HEENT: normal Neck: Thick, JVP not elevated,  Carotids 2+ bilaterally; no bruits. No lymphadenopathy or thryomegaly appreciated. Cor: PMI normal. Regular rate & rhythm. No rubs, gallops or murmurs. Lungs: clear Abdomen: soft, nontender, nondistended. No hepatosplenomegaly. No bruits or masses. Good bowel sounds. Extremities: no cyanosis, clubbing, rash. Trace ankle edema bilaterally.  Neuro: alert & orientedx3, cranial nerves grossly intact. Moves all 4 extremities w/o difficulty. Affect pleasant.  ASSESSMENT & PLAN: 1. Chronic Systolic Heart Failure:  EF 35-40% with moderately dilated RV on 5/15 echo, nonischemic cardiomyopathy.   NYHA II.  Volume looks much better overall.  Possible viral myocarditis.  OHS/OSA may play a role in her right heart failure.  However, she was able to keep her oxygen saturation > 92% with ambulation at a prior appointment.   - Continue lasix 40 mg twice a day.  - Continue current losartan and spironolactone. - Increase Coreg to 18.75 mg bid.   - Plan to repeat echo at 6 months (11/15).  - Reinforced daily weights, low salt diet, and limiting fluid intake to < 2 liters per day  2. HTN: BP controlled.  3. OSA: Snores--> Day time fatigue. Suspect OSA but finances will not allow a sleep study yet.  Hope to get in the near future.   4. AKI: Creatinine increased with increase in losartan.  Now back to prior dose.  BMET today.   Loralie Todd 11/02/2013

## 2013-11-04 ENCOUNTER — Telehealth (HOSPITAL_COMMUNITY): Payer: Self-pay | Admitting: *Deleted

## 2013-11-04 DIAGNOSIS — I509 Heart failure, unspecified: Secondary | ICD-10-CM

## 2013-11-04 MED ORDER — FUROSEMIDE 40 MG PO TABS: ORAL_TABLET | ORAL | Status: DC

## 2013-11-04 NOTE — Telephone Encounter (Signed)
Message copied by Scarlette Calico on Thu Nov 04, 2013  2:48 PM ------      Message from: Larey Dresser      Created: Wed Nov 03, 2013 11:00 AM       Creatinine higher.  Cut Lasix back to 40 qam, 20 qpm and repeat BMET 10 days. Marland Kitchen ------

## 2013-11-12 ENCOUNTER — Other Ambulatory Visit (INDEPENDENT_AMBULATORY_CARE_PROVIDER_SITE_OTHER): Payer: BC Managed Care – PPO | Admitting: *Deleted

## 2013-11-12 DIAGNOSIS — I509 Heart failure, unspecified: Secondary | ICD-10-CM

## 2013-11-13 LAB — BASIC METABOLIC PANEL
BUN/Creatinine Ratio: 16 (ref 11–26)
BUN: 32 mg/dL — ABNORMAL HIGH (ref 8–27)
CO2: 27 mmol/L (ref 18–29)
Calcium: 10 mg/dL (ref 8.7–10.3)
Chloride: 97 mmol/L (ref 97–108)
Creatinine, Ser: 1.98 mg/dL — ABNORMAL HIGH (ref 0.57–1.00)
GFR calc Af Amer: 31 mL/min/{1.73_m2} — ABNORMAL LOW (ref >59)
GFR calc non Af Amer: 27 mL/min/{1.73_m2} — ABNORMAL LOW (ref >59)
Glucose: 115 mg/dL — ABNORMAL HIGH (ref 65–99)
Potassium: 4.5 mmol/L (ref 3.5–5.2)
Sodium: 138 mmol/L (ref 134–144)

## 2013-11-16 ENCOUNTER — Telehealth (HOSPITAL_COMMUNITY): Payer: Self-pay | Admitting: *Deleted

## 2013-11-16 DIAGNOSIS — I5022 Chronic systolic (congestive) heart failure: Secondary | ICD-10-CM

## 2013-11-16 NOTE — Telephone Encounter (Signed)
Message copied by Scarlette Calico on Tue Nov 16, 2013 11:18 AM ------      Message from: Larey Dresser      Created: Mon Nov 15, 2013  7:08 AM       Creatinine higher.  Watch sodium intake and decrease Lasix to 40 qam x 4 days then 40 qam, 20 qpm with BMET in 10 days. ------

## 2013-11-19 ENCOUNTER — Telehealth (HOSPITAL_COMMUNITY): Payer: Self-pay | Admitting: Vascular Surgery

## 2013-11-19 NOTE — Telephone Encounter (Signed)
Pt is out of her carve

## 2013-11-25 ENCOUNTER — Other Ambulatory Visit (INDEPENDENT_AMBULATORY_CARE_PROVIDER_SITE_OTHER): Payer: BC Managed Care – PPO

## 2013-11-25 DIAGNOSIS — I5022 Chronic systolic (congestive) heart failure: Secondary | ICD-10-CM

## 2013-11-26 LAB — BASIC METABOLIC PANEL
BUN/Creatinine Ratio: 16 (ref 11–26)
BUN: 29 mg/dL — ABNORMAL HIGH (ref 8–27)
CO2: 25 mmol/L (ref 18–29)
Calcium: 10 mg/dL (ref 8.7–10.3)
Chloride: 100 mmol/L (ref 97–108)
Creatinine, Ser: 1.77 mg/dL — ABNORMAL HIGH (ref 0.57–1.00)
GFR calc Af Amer: 35 mL/min/{1.73_m2} — ABNORMAL LOW (ref >59)
GFR calc non Af Amer: 31 mL/min/{1.73_m2} — ABNORMAL LOW (ref >59)
Glucose: 108 mg/dL — ABNORMAL HIGH (ref 65–99)
Potassium: 5 mmol/L (ref 3.5–5.2)
Sodium: 140 mmol/L (ref 134–144)

## 2013-11-29 ENCOUNTER — Telehealth (HOSPITAL_COMMUNITY): Payer: Self-pay

## 2013-11-29 DIAGNOSIS — I5022 Chronic systolic (congestive) heart failure: Secondary | ICD-10-CM

## 2013-11-29 MED ORDER — FUROSEMIDE 40 MG PO TABS: 40.0000 mg | ORAL_TABLET | Freq: Every day | ORAL | Status: DC

## 2013-11-29 NOTE — Telephone Encounter (Signed)
Labr esults reviewed with patient, instructed to decrease lasix to 40mg  once daily.  Rx updated and sent to preferred pharmacy electronically.   Informed that Dr. Aundra Dubin prefers to have this checked in 2 weeks, patient prefers to have lab rechecked during her appointment with [place] in about a month.  Will add on for that time, advised to call our office if she has any weight gain, edema, or any other s/s that or concerning.  Aware and agreeable.   Renee Pain

## 2013-12-31 ENCOUNTER — Other Ambulatory Visit (HOSPITAL_COMMUNITY): Payer: Self-pay | Admitting: Cardiology

## 2013-12-31 DIAGNOSIS — I5022 Chronic systolic (congestive) heart failure: Secondary | ICD-10-CM

## 2014-01-04 ENCOUNTER — Ambulatory Visit (HOSPITAL_BASED_OUTPATIENT_CLINIC_OR_DEPARTMENT_OTHER)
Admission: RE | Admit: 2014-01-04 | Discharge: 2014-01-04 | Disposition: A | Discharge status: Home / Self Care | Payer: BC Managed Care – PPO | Source: Ambulatory Visit | Attending: Internal Medicine | Admitting: Internal Medicine

## 2014-01-04 ENCOUNTER — Ambulatory Visit (HOSPITAL_COMMUNITY)
Admission: RE | Admit: 2014-01-04 | Discharge: 2014-01-04 | Disposition: A | Discharge status: Home / Self Care | Payer: BC Managed Care – PPO | Source: Ambulatory Visit | Attending: Cardiology | Admitting: Cardiology

## 2014-01-04 VITALS — BP 130/80 | HR 81 | Wt 306.0 lb

## 2014-01-04 DIAGNOSIS — I50810 Right heart failure, unspecified: Secondary | ICD-10-CM | POA: Insufficient documentation

## 2014-01-04 DIAGNOSIS — I272 Other secondary pulmonary hypertension: Secondary | ICD-10-CM | POA: Insufficient documentation

## 2014-01-04 DIAGNOSIS — I5022 Chronic systolic (congestive) heart failure: Secondary | ICD-10-CM | POA: Insufficient documentation

## 2014-01-04 DIAGNOSIS — I517 Cardiomegaly: Secondary | ICD-10-CM

## 2014-01-04 DIAGNOSIS — I270 Primary pulmonary hypertension: Secondary | ICD-10-CM

## 2014-01-04 DIAGNOSIS — R0981 Nasal congestion: Secondary | ICD-10-CM | POA: Diagnosis not present

## 2014-01-04 DIAGNOSIS — I10 Essential (primary) hypertension: Secondary | ICD-10-CM | POA: Insufficient documentation

## 2014-01-04 DIAGNOSIS — I509 Heart failure, unspecified: Secondary | ICD-10-CM

## 2014-01-04 DIAGNOSIS — K7581 Nonalcoholic steatohepatitis (NASH): Secondary | ICD-10-CM | POA: Insufficient documentation

## 2014-01-04 DIAGNOSIS — Z7982 Long term (current) use of aspirin: Secondary | ICD-10-CM | POA: Insufficient documentation

## 2014-01-04 DIAGNOSIS — G4733 Obstructive sleep apnea (adult) (pediatric): Secondary | ICD-10-CM | POA: Insufficient documentation

## 2014-01-04 DIAGNOSIS — R609 Edema, unspecified: Secondary | ICD-10-CM | POA: Insufficient documentation

## 2014-01-04 DIAGNOSIS — Z79899 Other long term (current) drug therapy: Secondary | ICD-10-CM | POA: Insufficient documentation

## 2014-01-04 LAB — BASIC METABOLIC PANEL
Anion gap: 12 (ref 5–15)
BUN: 35 mg/dL — ABNORMAL HIGH (ref 6–23)
CO2: 28 mEq/L (ref 19–32)
Calcium: 10.4 mg/dL (ref 8.4–10.5)
Chloride: 100 mEq/L (ref 96–112)
Creatinine, Ser: 1.56 mg/dL — ABNORMAL HIGH (ref 0.50–1.10)
GFR calc Af Amer: 40 mL/min — ABNORMAL LOW (ref >90)
GFR calc non Af Amer: 35 mL/min — ABNORMAL LOW (ref >90)
Glucose, Bld: 96 mg/dL (ref 70–99)
Potassium: 5.5 mEq/L — ABNORMAL HIGH (ref 3.7–5.3)
Sodium: 140 mEq/L (ref 137–147)

## 2014-01-04 MED ORDER — CARVEDILOL 25 MG PO TABS: 25.0000 mg | ORAL_TABLET | Freq: Two times a day (BID) | ORAL | Status: DC

## 2014-01-04 NOTE — Addendum Note (Signed)
Encounter addended by: Scarlette Calico, RN on: 01/04/2014  4:23 PM<BR>     Documentation filed: Visit Diagnoses, Dx Association, Orders

## 2014-01-04 NOTE — Progress Notes (Signed)
Patient ID: Phyllis Todd, female   DOB: Mar 28, 1950, 63 y.o.   MRN: [id number] PCP: Hassie Bruce PA- Velora Heckler  HPI: Phyllis Todd is a 63 year old with a history of systolic heart failure, HTN, and NASH. Admitted to [facility] in 5/15 with increased dyspnea and edema and diagnosed wth new acute systolic heart failure.  Echo (5/15) showed EF 35-40% with moderately dilated RV.  Diuresed with IV lasix and later transitioned to lasix 40 mg po bid. She was placed on carvedilol, losartan, and spironolactone. She lost considerable weight in the hospital.    Previously Dr. Aundra Dubin increased her losartan was increased but had to go back to her prior dose given increase in creatinine.    She returns for follow up. Last visit lasix was decreased to 40 mg daily and carvedilol was increased to 18.75 mg twice a day.  Denies SOB/PND/Orthopnea. Weight at home 307-309 pounds. Taking all medications. Walking daily. Continues to take care of her parents. + nasal congestion. Says snoring has improved  ECHO today EF 50-55% Mild RV dysfunction.   RHC/LHC 07/12/13 RA mean 26 RV 60/33 mean 44 Thermo CO 5.5 CI 3.1  Fick 2.2/1.3 (52%) Coronaries ok   Labs 07/13/13 K 4.7 Creatinine 1.28  Labs 07/17/13 K 3.6 => 4 Creatinine 1.1 => 1.17 Pro BNP 4574 => 3081 Labs 7/15 K 4.7, creatinine 1.43 Labs 8/15 BNP 18.9' Labs 11/12/13 K 4.5 Creatinine 1.98 Labs 11/25/13 K 5.0 Creatinine 1.77 Lasix was cut back to 40 mg daily.    [place]: [place] Does not smoke or drink alcohol  FH: Mother CVA        Father CAD, HTN  ROS: All systems negative except as listed in HPI, PMH and Problem List.  PMH: 1. HTN 2. Morbid obesity 3. Chronic systolic CHF: Nonischemic cardiomyopathy.  Echo (5/15) with EF 35-40%, moderate LAE, moderately dilated RV.  RHC/LHC (6/15) with mean RA 26, PA 61/33 mean 44, mean PCWP 24, CI 3.1, PVR 3.6 WU; simultaneous LV/RV catheters not suggestive of constriction; no CAD on coronary angiography.   4. ACEI cough 5. NASH: Abdominal US (5/15) with fatty liver versus cirrhosis.   Current Outpatient Prescriptions  Medication Sig Dispense Refill  . acetaminophen (TYLENOL) 500 MG tablet Take 500 mg by mouth every 6 (six) hours as needed (pain).    Marland Kitchen aspirin EC 81 MG EC tablet Take 1 tablet (81 mg total) by mouth daily.    . carvedilol (COREG) 12.5 MG tablet Take 1.5 tablets (18.75 mg total) by mouth 2 (two) times daily with a meal. 180 tablet 6  . diphenhydrAMINE (BENADRYL) 25 mg capsule Take 25 mg by mouth every 8 (eight) hours as needed for allergies.     Marland Kitchen docusate sodium (COLACE) 100 MG capsule Take 100 mg by mouth 3 (three) times daily as needed for mild constipation.    . furosemide (LASIX) 40 MG tablet Take 1 tablet (40 mg total) by mouth daily. 90 tablet 6  . losartan (COZAAR) 50 MG tablet Take 75 mg by mouth daily.    . Multiple Vitamin (MULTIVITAMIN WITH MINERALS) TABS tablet Take 1 tablet by mouth daily.    . potassium chloride SA (K-DUR,KLOR-CON) 20 MEQ tablet Take 1 tablet (20 mEq total) by mouth daily. 30 tablet 6  . spironolactone (ALDACTONE) 25 MG tablet Take 1 tablet (25 mg total) by mouth daily. 30 tablet 6   No current facility-administered medications for this encounter.    PHYSICAL  EXAMDanley Danker Vitals:   01/04/14 1459  BP: 130/80  Pulse: 81  Weight: 306 lb (138.801 kg)  SpO2: 94%    General:  Well appearing. No resp difficulty HEENT: normal Neck: Thick, JVP not elevated,  Carotids 2+ bilaterally; no bruits. No lymphadenopathy or thryomegaly appreciated. Cor: PMI normal. Regular rate & rhythm. No rubs, gallops or murmurs. Lungs: clear Abdomen: obese soft, nontender, nondistended. No hepatosplenomegaly. No bruits or masses. Good bowel sounds. Extremities: no cyanosis, clubbing, rash. Trace ankle edema bilaterally.  Neuro: alert & orientedx3, cranial nerves grossly intact. Moves all 4 extremities w/o difficulty. Affect pleasant.  ASSESSMENT & PLAN: 1. Chronic  Biventricular Systolic Heart Failure:  LVEF has improved now 50-55% on echo today. RV still mildly depressed. OHS/OSA may play a role in her right heart failure.  - NYHA II.  Volume looks good.  - Continue lasix 40 mg daily .  - Continue current losartan and spironolactone. - Increase  Coreg to 25 mg bid. - Reinforced daily weights, low salt diet, and limiting fluid intake to < 2 liters per day    2. HTN: BP controlled.   3. Pulmonary HTN with mild RV dysfunction -PH is mixed picture. Agree with Dr. Aundra Dubin that OSA/OHS may be playing role. CT chset negative for PE in 5/15 -Refuses sleep study -She was able to keep her oxygen saturation > 92% with ambulation at a prior appointment.   -Will order overnight oximeter.    Glori Bickers MD  01/04/2014

## 2014-01-04 NOTE — Patient Instructions (Signed)
Increase Carvedilol to 25 mg Twice daily   Labs today  [facility] will call you to arrange the overnight oximetry test  Your physician recommends that you schedule a follow-up appointment in: 2-3 months

## 2014-01-04 NOTE — Progress Notes (Signed)
*  PRELIMINARY RESULTS* Echocardiogram 2D Echocardiogram has been performed.  Phyllis Todd 01/04/2014, 2:49 PM

## 2014-01-05 ENCOUNTER — Telehealth (HOSPITAL_COMMUNITY): Payer: Self-pay | Admitting: *Deleted

## 2014-01-05 DIAGNOSIS — I5022 Chronic systolic (congestive) heart failure: Secondary | ICD-10-CM

## 2014-01-05 NOTE — Telephone Encounter (Signed)
Pt aware.

## 2014-01-05 NOTE — Telephone Encounter (Signed)
-----   Message from Jolaine Artist, MD sent at 01/05/2014  1:58 PM EST ----- Stop kcl. Repeat next week.

## 2014-01-13 ENCOUNTER — Other Ambulatory Visit: Payer: BC Managed Care – PPO

## 2014-01-14 ENCOUNTER — Other Ambulatory Visit (INDEPENDENT_AMBULATORY_CARE_PROVIDER_SITE_OTHER): Payer: BC Managed Care – PPO

## 2014-01-14 DIAGNOSIS — I5022 Chronic systolic (congestive) heart failure: Secondary | ICD-10-CM

## 2014-01-15 LAB — BASIC METABOLIC PANEL
BUN/Creatinine Ratio: 17 (ref 11–26)
BUN: 29 mg/dL — ABNORMAL HIGH (ref 8–27)
CO2: 24 mmol/L (ref 18–29)
Calcium: 10.1 mg/dL (ref 8.7–10.3)
Chloride: 98 mmol/L (ref 97–108)
Creatinine, Ser: 1.68 mg/dL — ABNORMAL HIGH (ref 0.57–1.00)
GFR calc Af Amer: 38 mL/min/{1.73_m2} — ABNORMAL LOW (ref >59)
GFR calc non Af Amer: 33 mL/min/{1.73_m2} — ABNORMAL LOW (ref >59)
Glucose: 117 mg/dL — ABNORMAL HIGH (ref 65–99)
Potassium: 4.7 mmol/L (ref 3.5–5.2)
Sodium: 140 mmol/L (ref 134–144)

## 2014-01-20 ENCOUNTER — Encounter (HOSPITAL_COMMUNITY): Payer: Self-pay | Admitting: Cardiovascular Disease

## 2014-01-20 ENCOUNTER — Telehealth (HOSPITAL_COMMUNITY): Payer: Self-pay | Admitting: Vascular Surgery

## 2014-01-20 DIAGNOSIS — I5022 Chronic systolic (congestive) heart failure: Secondary | ICD-10-CM

## 2014-01-20 MED ORDER — CARVEDILOL 25 MG PO TABS: 25.0000 mg | ORAL_TABLET | Freq: Two times a day (BID) | ORAL | Status: DC

## 2014-01-20 NOTE — Telephone Encounter (Signed)
Pt needs new prescription of Carvedilol with dosage changes pt is about out of pills... Please advise

## 2014-02-21 ENCOUNTER — Ambulatory Visit (INDEPENDENT_AMBULATORY_CARE_PROVIDER_SITE_OTHER): Payer: 59 | Admitting: Nurse Practitioner

## 2014-02-21 ENCOUNTER — Encounter: Payer: Self-pay | Admitting: Nurse Practitioner

## 2014-02-21 DIAGNOSIS — I5041 Acute combined systolic (congestive) and diastolic (congestive) heart failure: Secondary | ICD-10-CM

## 2014-02-21 NOTE — Patient Instructions (Signed)
Nice to meet you!   Our office will contact you about your referral to Cardiology at the number you have provided.   Call us if you need [place]!

## 2014-02-21 NOTE — Progress Notes (Signed)
Pre visit review using our clinic review tool, if applicable. No additional management support is needed unless otherwise documented below in the visit note. 

## 2014-02-21 NOTE — Progress Notes (Signed)
   Subjective:    Patient ID: Phyllis Todd, female    DOB: Jan 04, 1951, 64 y.o.   MRN: [id number]  HPI  Phyllis Todd is a 64 yo female with a CC of heart failure and requesting a referral.   1) Denies problems with worsening SOB, DOE, or lower extremity edema. Pt states she has gained weight due to holiday season.   Wt Readings from Last 3 Encounters:  02/21/14 317 lb 6.4 oz (143.972 kg)  01/04/14 306 lb (138.801 kg)  11/02/13 302 lb (136.986 kg)   Sees Dr. Aundra Dubin and would like to continue. Pt has new insurance and this requires a referral she reports. She has an up coming appointment in the next few weeks.   Last BMET was 01/14/14 and was reviewed with abnormal glucose, BUN, creatinine, and GFR.   Review of Systems  Constitutional: Negative for fever, chills, diaphoresis, fatigue and unexpected weight change.  Respiratory: Negative for cough, chest tightness, shortness of breath and wheezing.   Cardiovascular: Negative for chest pain, palpitations and leg swelling.  Skin: Negative for rash.       Objective:   Physical Exam  Constitutional: She appears well-developed and well-nourished. No distress.  Cardiovascular: Normal rate and regular rhythm.  Exam reveals no gallop and no friction rub.   Murmur heard. Pulmonary/Chest: Effort normal and breath sounds normal.  Skin: She is not diaphoretic.  Psychiatric: She has a normal mood and affect. Her behavior is normal. Judgment and thought content normal.   BP 110/78 mmHg  Pulse 80  Temp(Src) 98 F (36.7 C) (Oral)  Resp 16  Ht 5' 3.5" (1.613 m)  Wt 317 lb 6.4 oz (143.972 kg)  BMI 55.34 kg/m2  SpO2 95%     Assessment & Plan:

## 2014-02-22 ENCOUNTER — Other Ambulatory Visit (HOSPITAL_COMMUNITY): Payer: Self-pay | Admitting: Cardiology

## 2014-02-22 DIAGNOSIS — I5022 Chronic systolic (congestive) heart failure: Secondary | ICD-10-CM

## 2014-02-22 MED ORDER — CARVEDILOL 25 MG PO TABS: 25.0000 mg | ORAL_TABLET | Freq: Two times a day (BID) | ORAL | Status: DC

## 2014-02-22 NOTE — Assessment & Plan Note (Signed)
Stable. Pt seeing Dr. Aundra Dubin, will place referral so she can continue being seen by office under new insurance. She has appointment in next few weeks she states. No complaints today.

## 2014-02-22 NOTE — Telephone Encounter (Signed)
Pt called to request refill on coreg [facility] [place]   As requested refills returned to pharmacy

## 2014-02-24 ENCOUNTER — Telehealth (HOSPITAL_COMMUNITY): Payer: Self-pay | Admitting: Cardiology

## 2014-02-25 NOTE — Telephone Encounter (Signed)
Overnight oximetry results- per Dr.Bensimhon significant de-sats at night, pt needs sleep study  Results reviewed with pt and she reports she could not afford  At this time and would like to arrange after the first of the year.  Per Dr.Bensimhon, we can order O2 2L via nasal canula at night  This order reported to patient and pt reports she does not want oxygen. Even if this is at night

## 2014-03-07 ENCOUNTER — Encounter: Payer: Self-pay | Admitting: Internal Medicine

## 2014-04-12 ENCOUNTER — Encounter (HOSPITAL_COMMUNITY): Payer: Self-pay

## 2014-04-12 ENCOUNTER — Ambulatory Visit (HOSPITAL_COMMUNITY)
Admission: RE | Admit: 2014-04-12 | Discharge: 2014-04-12 | Disposition: A | Discharge status: Home / Self Care | Payer: 59 | Source: Ambulatory Visit | Attending: Cardiology | Admitting: Cardiology

## 2014-04-12 VITALS — BP 130/82 | HR 75 | Wt 322.0 lb

## 2014-04-12 DIAGNOSIS — I270 Primary pulmonary hypertension: Secondary | ICD-10-CM

## 2014-04-12 DIAGNOSIS — G4733 Obstructive sleep apnea (adult) (pediatric): Secondary | ICD-10-CM | POA: Insufficient documentation

## 2014-04-12 DIAGNOSIS — I272 Other secondary pulmonary hypertension: Secondary | ICD-10-CM | POA: Diagnosis not present

## 2014-04-12 DIAGNOSIS — I429 Cardiomyopathy, unspecified: Secondary | ICD-10-CM | POA: Insufficient documentation

## 2014-04-12 DIAGNOSIS — I5022 Chronic systolic (congestive) heart failure: Secondary | ICD-10-CM | POA: Diagnosis present

## 2014-04-12 LAB — BASIC METABOLIC PANEL
Anion gap: 8 (ref 5–15)
BUN: 26 mg/dL — ABNORMAL HIGH (ref 6–23)
CO2: 30 mmol/L (ref 19–32)
Calcium: 10.4 mg/dL (ref 8.4–10.5)
Chloride: 101 mmol/L (ref 96–112)
Creatinine, Ser: 1.66 mg/dL — ABNORMAL HIGH (ref 0.50–1.10)
GFR calc Af Amer: 37 mL/min — ABNORMAL LOW (ref >90)
GFR calc non Af Amer: 32 mL/min — ABNORMAL LOW (ref >90)
Glucose, Bld: 114 mg/dL — ABNORMAL HIGH (ref 70–99)
Potassium: 4.2 mmol/L (ref 3.5–5.1)
Sodium: 139 mmol/L (ref 135–145)

## 2014-04-12 NOTE — Patient Instructions (Signed)
Labs today  Your physician has recommended that you have a sleep study. This test records several body functions during sleep, including: brain activity, eye movement, oxygen and carbon dioxide blood levels, heart rate and rhythm, breathing rate and rhythm, the flow of air through your mouth and nose, snoring, body muscle movements, and chest and belly movement.  You have been referred to [place] for oxygen at night  We will contact you in 3 months to schedule your next appointment.

## 2014-04-13 NOTE — Progress Notes (Signed)
Patient ID: Phyllis Todd, female   DOB: 07-27-1950, 64 y.o.   MRN: [id number] PCP: Lorane Gell  HPI: Mrs Mccart is a 64 year old with a history of systolic heart failure (nonischemic cardiomyopathy), HTN, and NASH. Admitted to [facility] in 5/15 with increased dyspnea and edema and diagnosed wth new acute systolic heart failure.  Echo (5/15) showed EF 35-40% with moderately dilated RV.  Diuresed with IV lasix and later transitioned to lasix 40 mg po bid. She was placed on carvedilol, losartan, and spironolactone. She lost considerable weight in the hospital.   Echo in 11/15 showed EF up to 55% with moderate LVH.    Today, she returns for followup.  Weight is up 16 lbs.  She reports increased eating and minimal exercise.  She does not think this is fluid.  She denies exertional dyspnea currently.  She can walk up steps without problems.  She had overnight oximetry with significant nocturnal desaturation but is not on oxygen at night yet.   RHC/LHC 07/12/13 RA mean 26 RV 60/33 mean 44 Thermo CO 5.5 CI 3.1  Fick 2.2/1.3 (52%) Coronaries ok   Labs 07/13/13 K 4.7 Creatinine 1.28  Labs 07/17/13 K 3.6 => 4 Creatinine 1.1 => 1.17 Pro BNP 4574 => 3081 Labs 7/15 K 4.7, creatinine 1.43 Labs 8/15 BNP 18.9' Labs 11/12/13 K 4.5 Creatinine 1.98 Labs 11/25/13 K 5.0 Creatinine 1.77 Lasix was cut back to 40 mg daily.  Labs 12/15: K 4.7, creatinine 1.68   SH: Lives with her parents in [place]. Does not smoke or drink alcohol  FH: Mother CVA        Father CAD, HTN  ROS: All systems negative except as listed in HPI, PMH and Problem List.  PMH: 1. HTN 2. Morbid obesity 3. Chronic systolic CHF: Nonischemic cardiomyopathy.  Echo (5/15) with EF 35-40%, moderate LAE, moderately dilated RV.  RHC/LHC (6/15) with mean RA 26, PA 61/33 mean 44, mean PCWP 24, CI 3.1, PVR 3.6 WU; simultaneous LV/RV catheters not suggestive of constriction; no CAD on coronary angiography.  Echo (11/15) with EF 55%, moderate LVH,  normal RV size and systolic function.  4. ACEI cough 5. NASH: Abdominal US (5/15) with fatty liver versus cirrhosis.  6. Nocturnal desaturation on overnight oximetry.   Current Outpatient Prescriptions  Medication Sig Dispense Refill  . acetaminophen (TYLENOL) 500 MG tablet Take 500 mg by mouth every 6 (six) hours as needed (pain).    Marland Kitchen aspirin EC 81 MG EC tablet Take 1 tablet (81 mg total) by mouth daily.    . carvedilol (COREG) 25 MG tablet Take 1 tablet (25 mg total) by mouth 2 (two) times daily with a meal. 60 tablet 3  . diphenhydrAMINE (BENADRYL) 25 mg capsule Take 25 mg by mouth every 8 (eight) hours as needed for allergies.     . furosemide (LASIX) 40 MG tablet Take 1 tablet (40 mg total) by mouth daily. 90 tablet 6  . losartan (COZAAR) 50 MG tablet Take 75 mg by mouth daily.    . Multiple Vitamin (MULTIVITAMIN WITH MINERALS) TABS tablet Take 1 tablet by mouth daily.    Marland Kitchen spironolactone (ALDACTONE) 25 MG tablet Take 1 tablet (25 mg total) by mouth daily. 30 tablet 6   No current facility-administered medications for this encounter.    PHYSICAL EXAM: Filed Vitals:   04/12/14 1447  BP: 130/82  Pulse: 75  Weight: 322 lb (146.058 kg)  SpO2: 95%    General:  Well appearing. No  resp difficulty HEENT: normal Neck: Thick, JVP not elevated,  Carotids 2+ bilaterally; no bruits. No lymphadenopathy or thryomegaly appreciated. Cor: PMI normal. Regular rate & rhythm. No rubs, gallops or murmurs. Lungs: clear Abdomen: obese soft, nontender, nondistended. No hepatosplenomegaly. No bruits or masses. Good bowel sounds. Extremities: no cyanosis, clubbing, rash. 1+ chronic lower extremity edema 1/2 to knees bilaterally.   Neuro: alert & orientedx3, cranial nerves grossly intact. Moves all 4 extremities w/o difficulty. Affect pleasant.  ASSESSMENT & PLAN: 1. Chronic Biventricular Systolic Heart Failure:  Nonischemic cardiomyopathy.  EF up to 55% on 11/15 echo (improved). OHS/OSA may play a  role in her right heart failure.  She has gained weight but does not appear significantly volume overloaded.  NYHA class II symptoms. - Continue lasix 40 mg daily .  - Continue current losartan and spironolactone. - Continue Coreg 25 mg bid.  - BMET/BNP today.  - Reinforced daily weights, low salt diet, and limiting fluid intake to < 2 liters per day    2. HTN: BP controlled.   3. Pulmonary HTN with mild RV dysfunction: OSA/OHS may be playing role, also component of pulmonary venous hypertension. CT chest negative for PE in 5/15. - Refused sleep study in the past.  I talked to her about this and she is willing to have one done.  - Significant desaturation on overnight oximetry.  I would like her to start on oxygen at night until she gets her sleep study done.  She is willing to do this as long as insurance covers.  4. Obesity: Needs aggressive work on weight loss.   [name] MD  04/13/2014

## 2014-04-26 ENCOUNTER — Other Ambulatory Visit: Payer: Self-pay | Admitting: Cardiology

## 2014-06-14 ENCOUNTER — Encounter (HOSPITAL_COMMUNITY): Payer: Self-pay

## 2014-06-14 NOTE — Progress Notes (Signed)
Patient in clinic today, states she is unable to do her sleep study on 06/17/14 at this time because her insurance will not cover enough of the cost.  States she is overwhelmed in medical bills at this time and taking care of her parents and will have to wait later on in the year to get this done when she can afford it.  Will let Dr. Aundra Dubin know.  Renee Pain

## 2014-06-17 ENCOUNTER — Encounter (HOSPITAL_BASED_OUTPATIENT_CLINIC_OR_DEPARTMENT_OTHER): Payer: 59

## 2014-08-01 ENCOUNTER — Other Ambulatory Visit (HOSPITAL_COMMUNITY): Payer: Self-pay | Admitting: Internal Medicine

## 2014-08-01 ENCOUNTER — Other Ambulatory Visit (HOSPITAL_COMMUNITY): Payer: Self-pay | Admitting: *Deleted

## 2014-08-01 MED ORDER — CARVEDILOL 25 MG PO TABS: ORAL_TABLET | ORAL | Status: DC

## 2014-09-03 ENCOUNTER — Other Ambulatory Visit: Payer: Self-pay | Admitting: Cardiology

## 2014-09-21 ENCOUNTER — Other Ambulatory Visit (HOSPITAL_COMMUNITY): Payer: Self-pay | Admitting: Cardiology

## 2015-01-03 ENCOUNTER — Other Ambulatory Visit: Payer: Self-pay | Admitting: Internal Medicine

## 2015-01-16 ENCOUNTER — Other Ambulatory Visit (HOSPITAL_COMMUNITY): Payer: Self-pay | Admitting: Internal Medicine

## 2015-02-01 ENCOUNTER — Other Ambulatory Visit: Payer: Self-pay | Admitting: Cardiology

## 2015-02-25 ENCOUNTER — Other Ambulatory Visit: Payer: Self-pay | Admitting: Cardiology

## 2015-03-04 ENCOUNTER — Other Ambulatory Visit: Payer: Self-pay | Admitting: Cardiology

## 2015-04-19 IMAGING — CR DG CHEST 2V
2 series · 2 of 2 positions shown · non-contrast
Comparison: None.

CLINICAL DATA: Shortness of breath and fever

EXAM:
CHEST  2 VIEW

[w chest pa]
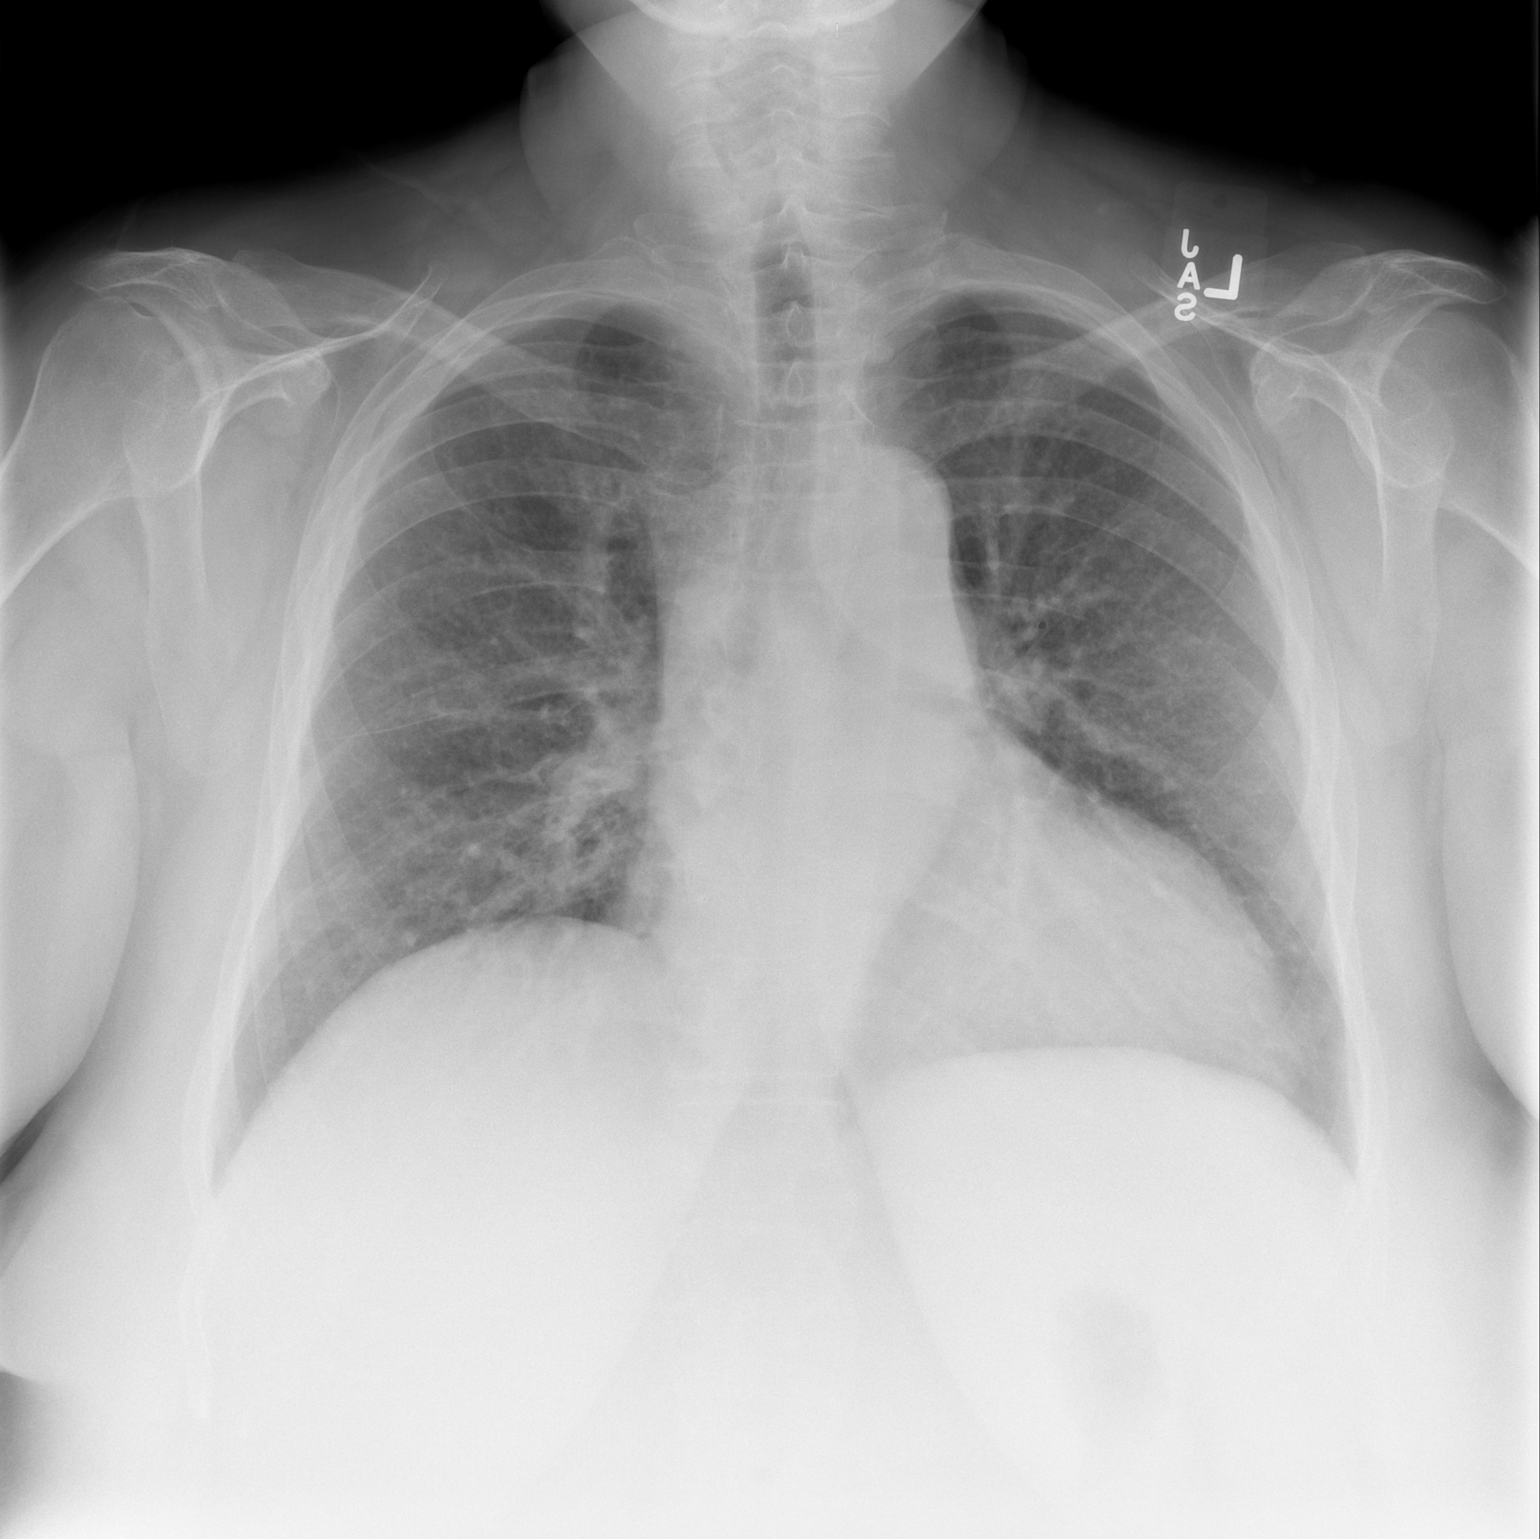

[w chest lat *]
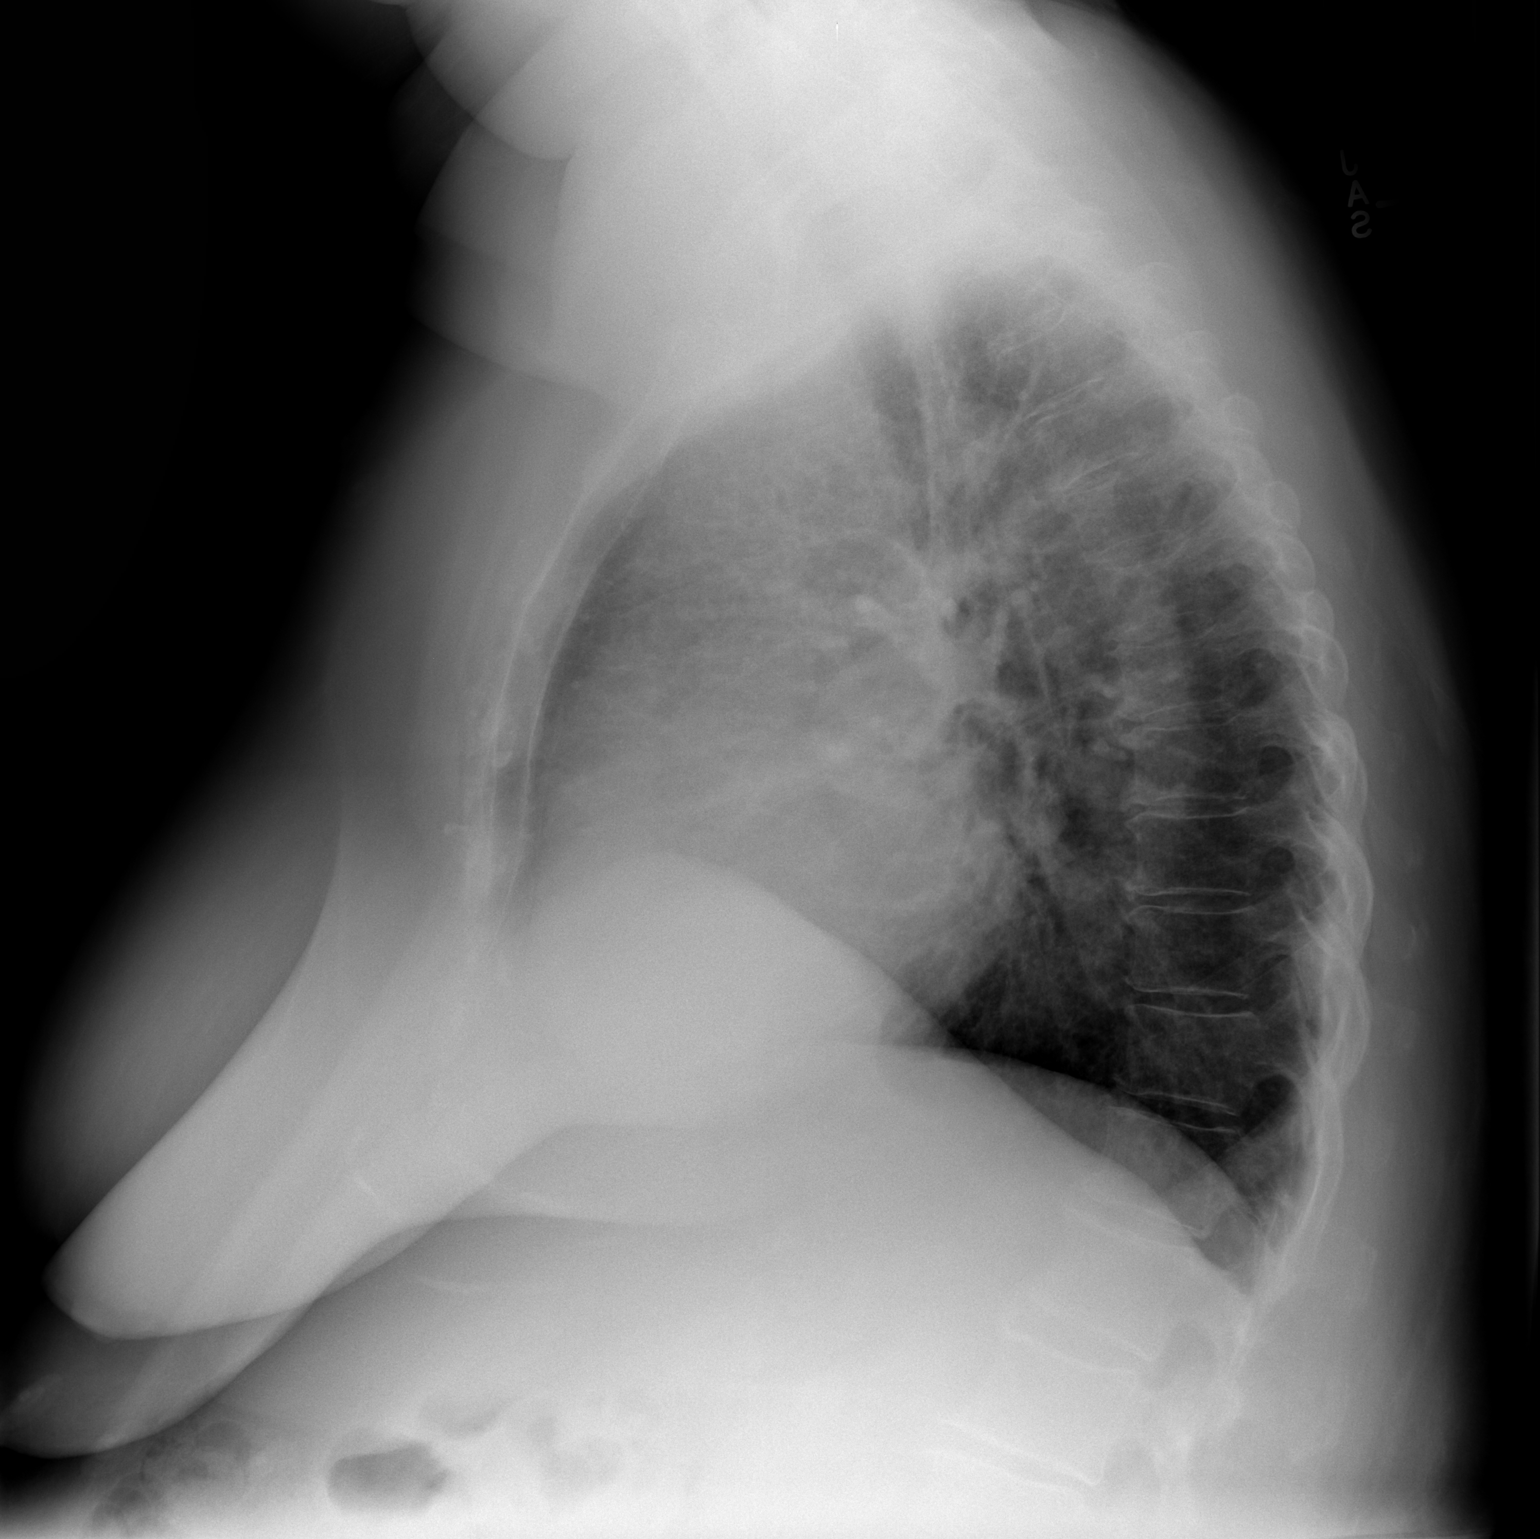

[2 of 2 positions shown; findings below may reference images not displayed]

FINDINGS: There is no edema or consolidation. Heart is upper normal in size
with normal pulmonary vascularity. No adenopathy. No bone lesions.
IMPRESSION: No edema or consolidation.

## 2015-05-12 ENCOUNTER — Telehealth (HOSPITAL_COMMUNITY): Payer: Self-pay | Admitting: Pharmacist

## 2015-05-12 NOTE — Telephone Encounter (Signed)
Losartan 1.5 tab daily PA approved.   Ruta Hinds. Velva Harman, PharmD, BCPS, CPP Clinical Pharmacist Pager: [phone] Phone: [phone] 05/12/2015 5:00 PM

## 2015-05-23 ENCOUNTER — Other Ambulatory Visit: Payer: Self-pay | Admitting: Internal Medicine

## 2015-07-09 ENCOUNTER — Other Ambulatory Visit: Payer: Self-pay | Admitting: Cardiology

## 2015-08-09 ENCOUNTER — Other Ambulatory Visit: Payer: Self-pay | Admitting: Internal Medicine

## 2015-08-09 ENCOUNTER — Other Ambulatory Visit: Payer: Self-pay | Admitting: Cardiology

## 2015-08-09 ENCOUNTER — Other Ambulatory Visit (HOSPITAL_COMMUNITY): Payer: Self-pay | Admitting: *Deleted

## 2015-08-09 MED ORDER — LOSARTAN POTASSIUM 50 MG PO TABS: ORAL_TABLET | ORAL | Status: DC

## 2015-08-21 ENCOUNTER — Other Ambulatory Visit: Payer: Self-pay | Admitting: Cardiology

## 2015-09-10 ENCOUNTER — Other Ambulatory Visit: Payer: Self-pay | Admitting: Cardiology

## 2015-09-11 NOTE — Telephone Encounter (Signed)
Please advise 

## 2015-09-24 ENCOUNTER — Other Ambulatory Visit: Payer: Self-pay | Admitting: Internal Medicine

## 2015-10-21 ENCOUNTER — Other Ambulatory Visit: Payer: Self-pay | Admitting: Internal Medicine

## 2015-11-18 ENCOUNTER — Other Ambulatory Visit: Payer: Self-pay | Admitting: Adult Health

## 2015-12-22 ENCOUNTER — Other Ambulatory Visit: Payer: Self-pay | Admitting: Cardiology

## 2016-01-10 ENCOUNTER — Other Ambulatory Visit: Payer: Self-pay | Admitting: Cardiology

## 2016-02-14 ENCOUNTER — Other Ambulatory Visit (HOSPITAL_COMMUNITY): Payer: Self-pay | Admitting: Adult Health

## 2016-03-10 ENCOUNTER — Other Ambulatory Visit (HOSPITAL_COMMUNITY): Payer: Self-pay | Admitting: Internal Medicine

## 2016-03-13 ENCOUNTER — Other Ambulatory Visit (HOSPITAL_COMMUNITY): Payer: Self-pay | Admitting: Internal Medicine

## 2016-04-07 ENCOUNTER — Other Ambulatory Visit: Payer: Self-pay | Admitting: Internal Medicine

## 2016-04-22 ENCOUNTER — Other Ambulatory Visit: Payer: Self-pay | Admitting: Cardiology

## 2016-04-22 ENCOUNTER — Other Ambulatory Visit (HOSPITAL_COMMUNITY): Payer: Self-pay | Admitting: Adult Health

## 2016-04-26 ENCOUNTER — Other Ambulatory Visit (HOSPITAL_COMMUNITY): Payer: Self-pay | Admitting: Cardiology

## 2016-04-26 MED ORDER — FUROSEMIDE 40 MG PO TABS: 40.0000 mg | ORAL_TABLET | Freq: Every day | ORAL | 2 refills | Status: DC

## 2016-04-26 MED ORDER — CARVEDILOL 25 MG PO TABS: 25.0000 mg | ORAL_TABLET | Freq: Two times a day (BID) | ORAL | 2 refills | Status: DC

## 2016-05-03 ENCOUNTER — Encounter (HOSPITAL_COMMUNITY): Payer: Self-pay

## 2016-05-03 ENCOUNTER — Ambulatory Visit (HOSPITAL_COMMUNITY)
Admission: RE | Admit: 2016-05-03 | Discharge: 2016-05-03 | Disposition: A | Discharge status: Home / Self Care | Payer: BLUE CROSS/BLUE SHIELD | Source: Ambulatory Visit | Attending: Internal Medicine | Admitting: Internal Medicine

## 2016-05-03 VITALS — BP 152/92 | HR 75 | Wt 346.5 lb

## 2016-05-03 DIAGNOSIS — I2729 Other secondary pulmonary hypertension: Secondary | ICD-10-CM | POA: Insufficient documentation

## 2016-05-03 DIAGNOSIS — Z7982 Long term (current) use of aspirin: Secondary | ICD-10-CM | POA: Diagnosis not present

## 2016-05-03 DIAGNOSIS — I428 Other cardiomyopathies: Secondary | ICD-10-CM | POA: Insufficient documentation

## 2016-05-03 DIAGNOSIS — I10 Essential (primary) hypertension: Secondary | ICD-10-CM

## 2016-05-03 DIAGNOSIS — I5022 Chronic systolic (congestive) heart failure: Secondary | ICD-10-CM | POA: Insufficient documentation

## 2016-05-03 DIAGNOSIS — I110 Hypertensive heart disease with heart failure: Secondary | ICD-10-CM | POA: Insufficient documentation

## 2016-05-03 DIAGNOSIS — I272 Pulmonary hypertension, unspecified: Secondary | ICD-10-CM

## 2016-05-03 DIAGNOSIS — I5082 Biventricular heart failure: Secondary | ICD-10-CM | POA: Insufficient documentation

## 2016-05-03 DIAGNOSIS — Z79899 Other long term (current) drug therapy: Secondary | ICD-10-CM | POA: Diagnosis not present

## 2016-05-03 LAB — CBC
HCT: 42.1 % (ref 36.0–46.0)
Hemoglobin: 14.1 g/dL (ref 12.0–15.0)
MCH: 33.8 pg (ref 26.0–34.0)
MCHC: 33.5 g/dL (ref 30.0–36.0)
MCV: 101 fL — ABNORMAL HIGH (ref 78.0–100.0)
Platelets: 200 10*3/uL (ref 150–400)
RBC: 4.17 MIL/uL (ref 3.87–5.11)
RDW: 12.7 % (ref 11.5–15.5)
WBC: 7 10*3/uL (ref 4.0–10.5)

## 2016-05-03 LAB — BASIC METABOLIC PANEL
Anion gap: 10 (ref 5–15)
BUN: 25 mg/dL — ABNORMAL HIGH (ref 6–20)
CO2: 29 mmol/L (ref 22–32)
Calcium: 10.6 mg/dL — ABNORMAL HIGH (ref 8.9–10.3)
Chloride: 100 mmol/L — ABNORMAL LOW (ref 101–111)
Creatinine, Ser: 1.47 mg/dL — ABNORMAL HIGH (ref 0.44–1.00)
GFR calc Af Amer: 43 mL/min — ABNORMAL LOW (ref >60)
GFR calc non Af Amer: 37 mL/min — ABNORMAL LOW (ref >60)
Glucose, Bld: 140 mg/dL — ABNORMAL HIGH (ref 65–99)
Potassium: 4.8 mmol/L (ref 3.5–5.1)
Sodium: 139 mmol/L (ref 135–145)

## 2016-05-03 LAB — BRAIN NATRIURETIC PEPTIDE: B Natriuretic Peptide: 31.9 pg/mL (ref 0.0–100.0)

## 2016-05-03 MED ORDER — SPIRONOLACTONE 25 MG PO TABS: 25.0000 mg | ORAL_TABLET | Freq: Every day | ORAL | 3 refills | Status: DC

## 2016-05-03 MED ORDER — LOSARTAN POTASSIUM 50 MG PO TABS
50.0000 mg | ORAL_TABLET | Freq: Two times a day (BID) | ORAL | 6 refills | Status: DC
Start: 2016-05-03 — End: 2017-04-07

## 2016-05-03 MED ORDER — FUROSEMIDE 40 MG PO TABS: 40.0000 mg | ORAL_TABLET | Freq: Every day | ORAL | 3 refills | Status: DC

## 2016-05-03 MED ORDER — CARVEDILOL 25 MG PO TABS: 25.0000 mg | ORAL_TABLET | Freq: Two times a day (BID) | ORAL | 3 refills | Status: DC

## 2016-05-03 MED ORDER — LOSARTAN POTASSIUM 50 MG PO TABS: ORAL_TABLET | ORAL | 3 refills | Status: DC

## 2016-05-03 NOTE — Patient Instructions (Addendum)
Routine lab work today. Will notify you of abnormal results, otherwise no news is good news!  Have labs drawn in 2 weeks at preferred location. Take paper Rx with you.  INCREASE Losartan to 50 mg (1 tab) twice daily.  Refills on heart medications sent to pharmacy.  Follow up 3 months with Dr. Aundra Dubin and echocardiogram.  Do the following things EVERYDAY: 1) Weigh yourself in the morning before breakfast. Write it down and keep it in a log. 2) Take your medicines as prescribed 3) Eat low salt foods-Limit salt (sodium) to 2000 mg per day.  4) Stay as active as you can everyday 5) Limit all fluids for the day to less than 2 liters

## 2016-05-03 NOTE — Progress Notes (Signed)
Patient ID: Phyllis Todd, female   DOB: 04-08-1950, 66 y.o.   MRN: [id number]     Advanced Heart Failure Clinic Note   PCP: None at present.  HF: Dr. Aundra Dubin  HPI: Phyllis Todd is a 66 year old with a history of systolic heart failure (nonischemic cardiomyopathy), HTN, and NASH. Admitted to [facility] in 5/15 with increased dyspnea and edema and diagnosed wth new acute systolic heart failure.  Echo (5/15) showed EF 35-40% with moderately dilated RV.  Diuresed with IV lasix and later transitioned to lasix 40 mg po bid. She was placed on carvedilol, losartan, and spironolactone. She lost considerable weight in the hospital.   Echo in 11/15 showed EF up to 55% with moderate LVH.    Pt returns for follow up.  Has not been seen in HF clinic since 04/2014.  She is up 24 lbs in this time. Has not had sleep study as it was not covered well by insurance.  She denies DOE, orthopnea, PND, or CP. Denies lightheadedness or dizziness. She is not wearing oxygen at home despite previous significant nocturnal desaturation.  She states she is walking 10-15 minutes most days. Trying to watch her diet and eat more vegetables and lean meats. Weight at home 340 lbs and steady.   RHC/LHC 07/12/13 RA mean 26 RV 60/33 mean 44 Thermo CO 5.5 CI 3.1  Fick 2.2/1.3 (52%) Coronaries ok   Labs 07/13/13 K 4.7 Creatinine 1.28  Labs 07/17/13 K 3.6 => 4 Creatinine 1.1 => 1.17 Pro BNP 4574 => 3081 Labs 7/15 K 4.7, creatinine 1.43 Labs 8/15 BNP 18.9' Labs 11/12/13 K 4.5 Creatinine 1.98 Labs 11/25/13 K 5.0 Creatinine 1.77 Lasix was cut back to 40 mg daily.  Labs 12/15: K 4.7, creatinine 1.68  SH: Lives with her parents in [place]. Does not smoke or drink alcohol  FH: Mother CVA        Father CAD, HTN  Review of systems complete and found to be negative unless listed in HPI.   PMH: 1. HTN 2. Morbid obesity 3. Chronic systolic CHF: Nonischemic cardiomyopathy.  Echo (5/15) with EF 35-40%, moderate LAE, moderately dilated RV.   RHC/LHC (6/15) with mean RA 26, PA 61/33 mean 44, mean PCWP 24, CI 3.1, PVR 3.6 WU; simultaneous LV/RV catheters not suggestive of constriction; no CAD on coronary angiography.  Echo (11/15) with EF 55%, moderate LVH, normal RV size and systolic function.  4. ACEI cough 5. NASH: Abdominal US (5/15) with fatty liver versus cirrhosis.  6. Nocturnal desaturation on overnight oximetry.   Current Outpatient Prescriptions  Medication Sig Dispense Refill  . acetaminophen (TYLENOL) 500 MG tablet Take 500 mg by mouth every 6 (six) hours as needed (pain).    Marland Kitchen aspirin EC 81 MG EC tablet Take 1 tablet (81 mg total) by mouth daily.    . carvedilol (COREG) 25 MG tablet Take 1 tablet (25 mg total) by mouth 2 (two) times daily with a meal. 60 tablet 2  . diphenhydrAMINE (BENADRYL) 25 mg capsule Take 25 mg by mouth every 8 (eight) hours as needed for allergies.     . furosemide (LASIX) 40 MG tablet Take 1 tablet (40 mg total) by mouth daily. 30 tablet 2  . losartan (COZAAR) 50 MG tablet Take 25-50 mg by mouth as directed. Take 1/2 in the Morning and 1 tablet in the evening daily    . Multiple Vitamin (MULTIVITAMIN WITH MINERALS) TABS tablet Take 1 tablet by mouth daily.    Marland Kitchen  spironolactone (ALDACTONE) 25 MG tablet TAKE 1 TABLET BY MOUTH ONCE DAILY. 30 tablet 3   No current facility-administered medications for this encounter.     PHYSICAL EXAM: Vitals:   05/03/16 1049  BP: (!) 152/92  Pulse: 75  SpO2: 95%  Weight: (!) 346 lb 8 oz (157.2 kg)   Wt Readings from Last 3 Encounters:  05/03/16 (!) 346 lb 8 oz (157.2 kg)  04/12/14 (!) 322 lb (146.1 kg)  02/21/14 (!) 317 lb 6.4 oz (144 kg)    General: Morbidly obese, NAD at rest. Ambulated into clinic without difficulty.  HEENT: Normal Neck: Thick, Unable to see JVP,  Carotids 2+ bilaterally; no bruits. No thyromegaly or nodule noted.  Cor: PMI normal. Distant. RRR. No M/G/R noted.  Lungs: CTAB, normal effort Abdomen: Morbidly obese, soft, NT, ND, no  HSM. No bruits or masses. +BS  Extremities: No cyanosis, clubbing, rash. 1+ chronic BLE edema.    Neuro: Alert & oriented x 3. Cranial nerves grossly intact. Moves all 4 extremities w/o difficulty. Affect pleasant    ASSESSMENT & PLAN: 1. Chronic Biventricular Systolic Heart Failure:  Nonischemic cardiomyopathy.  EF up to 55% on 11/15 echo (improved). OHS/OSA may play a role in her right heart failure.   - Pt has continued to gain weight. Volume difficult to assess but looks OK.  - NYHA II.  - Continue lasix 40 mg daily. Should take extra 40 mg as needed.  - Increase losartan 50 mg BID.  - Continue spironolactone 25 mg daily.   - Continue coreg 25 mg BID.   - Reinforced fluid restriction to < 2 L daily, sodium restriction to less than 2000 mg daily, and the importance of daily weights.    2. HTN - BP elevated in clinic today. Pt states it usually runs closer to 120.  3. Pulmonary HTN with mild RV dysfunction: OSA/OHS may be playing role, also component of pulmonary venous hypertension. CT chest negative for PE in 5/15. - Pt is adamant that she cannot afford and will not undergo a sleep study. Will continue to encourage. - Previously had significant desaturation on overnight oximetry. Pt refused 02.  4. Obesity:  - Discussed need to lose a significant amount of weight. Needs to limit portion size and increase activity as able.         Increase losartan as above. Labs today and 2 weeks. Will see back in 3 months on MD side with Echo. Sooner with symptoms.  Shirley Friar, PA-C  05/03/2016  Greater than 50% of the 25 minute visit was spent in counseling/coordination of care regarding disease state education, weight loss and diet counseling, and medical regimen discussion with Pharm-D on site.

## 2016-05-07 ENCOUNTER — Telehealth (HOSPITAL_COMMUNITY): Payer: Self-pay | Admitting: Pharmacist

## 2016-05-07 NOTE — Telephone Encounter (Signed)
Losartan 50 mg BID PA approved by [employer] through 02/10/38.   Ruta Hinds. Velva Harman, PharmD, BCPS, CPP Clinical Pharmacist Pager: [phone] Phone: [phone] 05/07/2016 8:53 AM

## 2016-05-10 ENCOUNTER — Telehealth (HOSPITAL_COMMUNITY): Payer: Self-pay

## 2016-05-10 NOTE — Telephone Encounter (Signed)
Faylene Million Peptide  Order: [id number]  Status:  Final result Visible to patient:  Yes (MyChart) Dx:  Chronic systolic CHF (congestive hear...  Notes recorded by Effie Berkshire, RN on 05/10/2016 at 9:22 AM EDT Patient aware and agreeable. ------  Notes recorded by Kerry Dory, CMA on 05/07/2016 at 3:07 PM EDT Left message for patient to call back. X:[phone] ------  Notes recorded by Shirley Friar, PA-C on 05/06/2016 at 5:22 PM EDT K on upper end of normal. Please make sure pt has labs drawn. Needs them drawn by end of this week if possible.    Legrand Como [address] [place], PA-C 05/06/2016 5:22 PM

## 2016-05-28 ENCOUNTER — Other Ambulatory Visit (HOSPITAL_COMMUNITY): Payer: Self-pay

## 2016-05-28 DIAGNOSIS — I5022 Chronic systolic (congestive) heart failure: Secondary | ICD-10-CM

## 2016-05-28 NOTE — Progress Notes (Signed)
bmet  

## 2016-05-31 ENCOUNTER — Ambulatory Visit (HOSPITAL_COMMUNITY)
Admission: RE | Admit: 2016-05-31 | Discharge: 2016-05-31 | Disposition: A | Discharge status: Home / Self Care | Payer: BLUE CROSS/BLUE SHIELD | Source: Ambulatory Visit | Attending: Cardiology | Admitting: Cardiology

## 2016-05-31 DIAGNOSIS — I5022 Chronic systolic (congestive) heart failure: Secondary | ICD-10-CM | POA: Diagnosis present

## 2016-05-31 LAB — BASIC METABOLIC PANEL
Anion gap: 7 (ref 5–15)
BUN: 22 mg/dL — ABNORMAL HIGH (ref 6–20)
CO2: 31 mmol/L (ref 22–32)
Calcium: 10.6 mg/dL — ABNORMAL HIGH (ref 8.9–10.3)
Chloride: 101 mmol/L (ref 101–111)
Creatinine, Ser: 1.47 mg/dL — ABNORMAL HIGH (ref 0.44–1.00)
GFR calc Af Amer: 43 mL/min — ABNORMAL LOW (ref >60)
GFR calc non Af Amer: 37 mL/min — ABNORMAL LOW (ref >60)
Glucose, Bld: 117 mg/dL — ABNORMAL HIGH (ref 65–99)
Potassium: 4.4 mmol/L (ref 3.5–5.1)
Sodium: 139 mmol/L (ref 135–145)

## 2016-08-05 ENCOUNTER — Encounter (HOSPITAL_COMMUNITY): Payer: BLUE CROSS/BLUE SHIELD

## 2016-08-05 ENCOUNTER — Other Ambulatory Visit (HOSPITAL_COMMUNITY): Payer: BLUE CROSS/BLUE SHIELD

## 2017-04-07 ENCOUNTER — Other Ambulatory Visit (HOSPITAL_COMMUNITY): Payer: Self-pay | Admitting: *Deleted

## 2017-04-07 MED ORDER — FUROSEMIDE 40 MG PO TABS: 40.0000 mg | ORAL_TABLET | Freq: Every day | ORAL | 3 refills | Status: DC

## 2017-04-07 MED ORDER — LOSARTAN POTASSIUM 50 MG PO TABS: 50.0000 mg | ORAL_TABLET | Freq: Two times a day (BID) | ORAL | 3 refills | Status: DC

## 2017-04-07 MED ORDER — LOSARTAN POTASSIUM 50 MG PO TABS: 50.0000 mg | ORAL_TABLET | Freq: Two times a day (BID) | ORAL | 0 refills | Status: DC

## 2017-04-07 MED ORDER — SPIRONOLACTONE 25 MG PO TABS: 25.0000 mg | ORAL_TABLET | Freq: Every day | ORAL | 3 refills | Status: DC

## 2017-04-07 MED ORDER — CARVEDILOL 25 MG PO TABS: 25.0000 mg | ORAL_TABLET | Freq: Two times a day (BID) | ORAL | 3 refills | Status: DC

## 2017-04-07 NOTE — Telephone Encounter (Signed)
Pt called for refills but was due for an appointment last year with echo and f/u Dr.McLean. Patient is having trouble with her insurance but will be fully covered by medicare in June. Patient said it costs her $600 every time she has an office visit with Dr.McLean.  Patient needs refills until June when her new insurance kicks in and she can be seen in office. I told patient we will no longer be able to fill any of her medications after June if she does not keep her scheduled appointments. I told her it is unsafe to continue to take medications without being followed closely by her physician. Patient agreed to call in June once she receives insurance and schedule appointment.

## 2017-08-26 ENCOUNTER — Other Ambulatory Visit (HOSPITAL_COMMUNITY): Payer: Self-pay | Admitting: Cardiology

## 2017-09-30 ENCOUNTER — Other Ambulatory Visit (HOSPITAL_COMMUNITY): Payer: Self-pay | Admitting: Cardiology

## 2017-09-30 ENCOUNTER — Other Ambulatory Visit (HOSPITAL_COMMUNITY): Payer: Self-pay | Admitting: *Deleted

## 2017-09-30 DIAGNOSIS — I5022 Chronic systolic (congestive) heart failure: Secondary | ICD-10-CM

## 2017-10-28 ENCOUNTER — Other Ambulatory Visit (HOSPITAL_COMMUNITY): Payer: Self-pay | Admitting: Cardiology

## 2017-11-24 ENCOUNTER — Ambulatory Visit (HOSPITAL_COMMUNITY)
Admission: RE | Admit: 2017-11-24 | Discharge: 2017-11-24 | Disposition: A | Discharge status: Home / Self Care | Payer: Medicare HMO | Source: Ambulatory Visit | Attending: Cardiology | Admitting: Cardiology

## 2017-11-24 ENCOUNTER — Ambulatory Visit (HOSPITAL_BASED_OUTPATIENT_CLINIC_OR_DEPARTMENT_OTHER)
Admission: RE | Admit: 2017-11-24 | Discharge: 2017-11-24 | Disposition: A | Discharge status: Home / Self Care | Payer: Medicare HMO | Source: Ambulatory Visit | Attending: Cardiology | Admitting: Cardiology

## 2017-11-24 VITALS — BP 155/94 | HR 74 | Wt 356.8 lb

## 2017-11-24 DIAGNOSIS — Z79899 Other long term (current) drug therapy: Secondary | ICD-10-CM | POA: Insufficient documentation

## 2017-11-24 DIAGNOSIS — Z7982 Long term (current) use of aspirin: Secondary | ICD-10-CM | POA: Diagnosis not present

## 2017-11-24 DIAGNOSIS — I428 Other cardiomyopathies: Secondary | ICD-10-CM | POA: Diagnosis not present

## 2017-11-24 DIAGNOSIS — I4510 Unspecified right bundle-branch block: Secondary | ICD-10-CM | POA: Diagnosis not present

## 2017-11-24 DIAGNOSIS — I272 Pulmonary hypertension, unspecified: Secondary | ICD-10-CM | POA: Insufficient documentation

## 2017-11-24 DIAGNOSIS — G4733 Obstructive sleep apnea (adult) (pediatric): Secondary | ICD-10-CM | POA: Insufficient documentation

## 2017-11-24 DIAGNOSIS — I5022 Chronic systolic (congestive) heart failure: Secondary | ICD-10-CM

## 2017-11-24 DIAGNOSIS — I110 Hypertensive heart disease with heart failure: Secondary | ICD-10-CM | POA: Diagnosis not present

## 2017-11-24 DIAGNOSIS — Z823 Family history of stroke: Secondary | ICD-10-CM | POA: Insufficient documentation

## 2017-11-24 DIAGNOSIS — Z6841 Body Mass Index (BMI) 40.0 and over, adult: Secondary | ICD-10-CM | POA: Diagnosis not present

## 2017-11-24 DIAGNOSIS — I5082 Biventricular heart failure: Secondary | ICD-10-CM | POA: Insufficient documentation

## 2017-11-24 DIAGNOSIS — E785 Hyperlipidemia, unspecified: Secondary | ICD-10-CM

## 2017-11-24 DIAGNOSIS — Z8249 Family history of ischemic heart disease and other diseases of the circulatory system: Secondary | ICD-10-CM | POA: Insufficient documentation

## 2017-11-24 LAB — LIPID PANEL
Cholesterol: 238 mg/dL — ABNORMAL HIGH (ref 0–200)
HDL: 43 mg/dL (ref >40)
LDL Cholesterol: 148 mg/dL — ABNORMAL HIGH (ref 0–99)
Total CHOL/HDL Ratio: 5.5 RATIO
Triglycerides: 237 mg/dL — ABNORMAL HIGH (ref <150)
VLDL: 47 mg/dL — ABNORMAL HIGH (ref 0–40)

## 2017-11-24 LAB — CBC
HCT: 46.2 % — ABNORMAL HIGH (ref 36.0–46.0)
Hemoglobin: 14.5 g/dL (ref 12.0–15.0)
MCH: 32.6 pg (ref 26.0–34.0)
MCHC: 31.4 g/dL (ref 30.0–36.0)
MCV: 103.8 fL — ABNORMAL HIGH (ref 80.0–100.0)
Platelets: 252 10*3/uL (ref 150–400)
RBC: 4.45 MIL/uL (ref 3.87–5.11)
RDW: 12.1 % (ref 11.5–15.5)
WBC: 8.1 10*3/uL (ref 4.0–10.5)
nRBC: 0 % (ref 0.0–0.2)

## 2017-11-24 LAB — BASIC METABOLIC PANEL
Anion gap: 11 (ref 5–15)
BUN: 26 mg/dL — ABNORMAL HIGH (ref 8–23)
CO2: 30 mmol/L (ref 22–32)
Calcium: 10.7 mg/dL — ABNORMAL HIGH (ref 8.9–10.3)
Chloride: 99 mmol/L (ref 98–111)
Creatinine, Ser: 1.61 mg/dL — ABNORMAL HIGH (ref 0.44–1.00)
GFR calc Af Amer: 38 mL/min — ABNORMAL LOW (ref >60)
GFR calc non Af Amer: 33 mL/min — ABNORMAL LOW (ref >60)
Glucose, Bld: 114 mg/dL — ABNORMAL HIGH (ref 70–99)
Potassium: 4.4 mmol/L (ref 3.5–5.1)
Sodium: 140 mmol/L (ref 135–145)

## 2017-11-24 NOTE — Progress Notes (Signed)
  Echocardiogram 2D Echocardiogram has been performed.  Darlina Sicilian M 11/24/2017, 1:55 PM

## 2017-11-24 NOTE — Progress Notes (Signed)
Patient ID: Phyllis Todd, female   DOB: Dec 10, 1950, 67 y.o.   MRN: [id number]     Advanced Heart Failure Clinic Note   PCP: None at present.  HF: Dr. Aundra Dubin  HPI: Phyllis Todd is a 67 y.o. with a history of systolic heart failure (nonischemic cardiomyopathy), HTN, and NASH. Admitted to [facility] in 5/15 with increased dyspnea and edema and diagnosed wth new acute systolic heart failure.  Echo (5/15) showed EF 35-40% with moderately dilated RV.  Diuresed with IV lasix and later transitioned to lasix 40 mg po bid. She was placed on carvedilol, losartan, and spironolactone. She lost considerable weight in the hospital.   Echo in 11/15 showed EF up to 55% with moderate LVH.  Echo in 10/19 showed EF 55-60%, moderate diastolic dysfunction, mildly dilated RV with normal systolic function, mildly D-shaped interventricular septum with evidence for RV pressure/volume overload.   Pt returns for followup of CHF.  She has gained 10 lbs since last appointment.  She has daytime sleepiness, not sure if she snores.  No chest pain.  No dyspnea walking on flat ground (not very active in general).  No orthopnea/PND.  BP high today but she says that it typically runs SBP 120s-130s when she checks at home.    ECG (personally reviewed): NSR, PVC, RBBB  Labs 07/13/13 K 4.7 Creatinine 1.28  Labs 07/17/13 K 3.6 => 4 Creatinine 1.1 => 1.17 Pro BNP 4574 => 3081 Labs 7/15 K 4.7, creatinine 1.43 Labs 8/15 BNP 18.9' Labs 11/12/13 K 4.5 Creatinine 1.98 Labs 11/25/13 K 5.0 Creatinine 1.77 Lasix was cut back to 40 mg daily.  Labs 12/15: K 4.7, creatinine 1.68 Labs 4/18: K 4.4, creatinine 1.47  SH: Lives with her father in [place]. Does not smoke or drink alcohol  FH: Mother CVA        Father CAD, HTN  Review of systems complete and found to be negative unless listed in HPI.   PMH: 1. HTN 2. Morbid obesity 3. Chronic systolic CHF: Nonischemic cardiomyopathy.  Echo (5/15) with EF 35-40%, moderate LAE, moderately  dilated RV.  RHC/LHC (6/15) with mean RA 26, PA 61/33 mean 44, mean PCWP 24, CI 3.1, PVR 3.6 WU; simultaneous LV/RV catheters not suggestive of constriction; no CAD on coronary angiography.  Echo (11/15) with EF 55%, moderate LVH, normal RV size and systolic function.  - Echo in 10/19 showed EF 55-60%, moderate diastolic dysfunction, mildly dilated RV with normal systolic function, mildly D-shaped interventricular septum with evidence for RV pressure/volume overload.  4. ACEI cough 5. NASH: Abdominal US (5/15) with fatty liver versus cirrhosis.  6. Nocturnal desaturation on overnight oximetry.   Current Outpatient Medications  Medication Sig Dispense Refill  . acetaminophen (TYLENOL) 500 MG tablet Take 500 mg by mouth every 6 (six) hours as needed (pain).    Marland Kitchen aspirin EC 81 MG EC tablet Take 1 tablet (81 mg total) by mouth daily.    . carvedilol (COREG) 25 MG tablet Take 1 tablet (25 mg total) by mouth 2 (two) times daily with a meal. 180 tablet 3  . diphenhydrAMINE (BENADRYL) 25 mg capsule Take 25 mg by mouth every 8 (eight) hours as needed for allergies.     . furosemide (LASIX) 40 MG tablet Take 1 tablet (40 mg total) by mouth daily. 90 tablet 3  . losartan (COZAAR) 50 MG tablet TAKE 1 TABLET BY MOUTH TWICE DAILY 60 tablet 0  . Multiple Vitamin (MULTIVITAMIN WITH MINERALS) TABS tablet Take 1 tablet  by mouth daily.    Marland Kitchen spironolactone (ALDACTONE) 25 MG tablet Take 1 tablet (25 mg total) by mouth daily. 90 tablet 3   No current facility-administered medications for this encounter.     PHYSICAL EXAM: Vitals:   11/24/17 1358  BP: (!) 155/94  Pulse: 74  SpO2: 94%  Weight: (!) 161.8 kg (356 lb 12.8 oz)   Wt Readings from Last 3 Encounters:  11/24/17 (!) 161.8 kg (356 lb 12.8 oz)  05/03/16 (!) 157.2 kg (346 lb 8 oz)  04/12/14 (!) 146.1 kg (322 lb)    General: NAD Neck: Thick, no JVD, no thyromegaly or thyroid nodule.  Lungs: Clear to auscultation bilaterally with normal respiratory  effort. CV: Nondisplaced PMI.  Heart regular S1/S2, no S3/S4, no murmur.  1+ chronic edema to knees.  No carotid bruit.  Normal pedal pulses.  Abdomen: Soft, nontender, no hepatosplenomegaly, no distention.  Skin: Intact without lesions or rashes.  Neurologic: Alert and oriented x 3.  Psych: Normal affect. Extremities: No clubbing or cyanosis.  HEENT: Normal.   ASSESSMENT & PLAN: 1. Chronic Biventricular Systolic Heart Failure:  Nonischemic cardiomyopathy.  EF up to 55% on 11/15 echo (improved).  Repeat echo was done today and reviewed, EF 55-60% with mildly dilated RV and D-shaped interventricular septum.  This suggests a degree of RV failure. I strongly suspect OSA, and think this may play a role in her RV failure. Pt has continued to gain weight. Volume difficult to assess but looks OK. NYHA class II symptoms.  - Continue lasix 40 mg daily. BMET today and will need q3 months in the future given spironolactone use.   - Continue losartan 50 mg BID.  - Continue spironolactone 25 mg daily.   - Continue coreg 25 mg BID.      2. HTN: BP elevated in clinic today. Pt states it usually runs closer to 120 when she checks at home.  - Check BP daily for 2 wks, call us with readings.  If SBP > 130 generally, will add amlodipine 5 mg daily.  3. Pulmonary HTN with mild RV dysfunction: OSA may be playing role.  CT chest negative for PE in 5/15. - I will arrange for a sleep study.   4. Obesity: She continues to gain weight.  - I will refer her to the weight management program with Dr. Leafy Ro.         Followup in 6 months with PA  [name] 11/24/2017

## 2017-11-24 NOTE — Patient Instructions (Signed)
Labs today  Labs in 3 months  Your physician has recommended that you have a sleep study. This test records several body functions during sleep, including: brain activity, eye movement, oxygen and carbon dioxide blood levels, heart rate and rhythm, breathing rate and rhythm, the flow of air through your mouth and nose, snoring, body muscle movements, and chest and belly movement.  You have been referred to Dr Leafy Ro in [facility], they will contact you to schedule  We will contact you in 6 months to schedule your next appointment.

## 2017-11-25 ENCOUNTER — Other Ambulatory Visit (HOSPITAL_COMMUNITY): Payer: Self-pay | Admitting: Cardiology

## 2017-11-25 DIAGNOSIS — I5022 Chronic systolic (congestive) heart failure: Secondary | ICD-10-CM

## 2018-02-24 ENCOUNTER — Other Ambulatory Visit (HOSPITAL_COMMUNITY): Payer: Medicare HMO

## 2018-03-02 ENCOUNTER — Ambulatory Visit (HOSPITAL_COMMUNITY)
Admission: RE | Admit: 2018-03-02 | Discharge: 2018-03-02 | Disposition: A | Discharge status: Home / Self Care | Payer: Medicare HMO | Source: Ambulatory Visit | Attending: Internal Medicine | Admitting: Internal Medicine

## 2018-03-02 DIAGNOSIS — I5022 Chronic systolic (congestive) heart failure: Secondary | ICD-10-CM | POA: Insufficient documentation

## 2018-03-02 DIAGNOSIS — I272 Pulmonary hypertension, unspecified: Secondary | ICD-10-CM | POA: Insufficient documentation

## 2018-03-02 LAB — BASIC METABOLIC PANEL
Anion gap: 11 (ref 5–15)
BUN: 25 mg/dL — ABNORMAL HIGH (ref 8–23)
CO2: 26 mmol/L (ref 22–32)
Calcium: 10.3 mg/dL (ref 8.9–10.3)
Chloride: 103 mmol/L (ref 98–111)
Creatinine, Ser: 1.88 mg/dL — ABNORMAL HIGH (ref 0.44–1.00)
GFR calc Af Amer: 32 mL/min — ABNORMAL LOW (ref >60)
GFR calc non Af Amer: 28 mL/min — ABNORMAL LOW (ref >60)
Glucose, Bld: 106 mg/dL — ABNORMAL HIGH (ref 70–99)
Potassium: 4.7 mmol/L (ref 3.5–5.1)
Sodium: 140 mmol/L (ref 135–145)

## 2018-03-04 ENCOUNTER — Other Ambulatory Visit (HOSPITAL_COMMUNITY): Payer: Medicare HMO

## 2018-03-13 ENCOUNTER — Ambulatory Visit (HOSPITAL_COMMUNITY)
Admission: RE | Admit: 2018-03-13 | Discharge: 2018-03-13 | Disposition: A | Discharge status: Home / Self Care | Payer: Medicare HMO | Source: Ambulatory Visit | Attending: Cardiology | Admitting: Cardiology

## 2018-03-13 DIAGNOSIS — I428 Other cardiomyopathies: Secondary | ICD-10-CM | POA: Diagnosis not present

## 2018-03-13 LAB — BASIC METABOLIC PANEL
Anion gap: 11 (ref 5–15)
BUN: 26 mg/dL — ABNORMAL HIGH (ref 8–23)
CO2: 24 mmol/L (ref 22–32)
Calcium: 10.6 mg/dL — ABNORMAL HIGH (ref 8.9–10.3)
Chloride: 105 mmol/L (ref 98–111)
Creatinine, Ser: 1.62 mg/dL — ABNORMAL HIGH (ref 0.44–1.00)
GFR calc Af Amer: 38 mL/min — ABNORMAL LOW (ref >60)
GFR calc non Af Amer: 33 mL/min — ABNORMAL LOW (ref >60)
Glucose, Bld: 114 mg/dL — ABNORMAL HIGH (ref 70–99)
Potassium: 4.8 mmol/L (ref 3.5–5.1)
Sodium: 140 mmol/L (ref 135–145)

## 2018-05-18 ENCOUNTER — Other Ambulatory Visit (HOSPITAL_COMMUNITY): Payer: Self-pay | Admitting: Student

## 2018-05-18 ENCOUNTER — Other Ambulatory Visit (HOSPITAL_COMMUNITY): Payer: Self-pay | Admitting: Cardiology

## 2018-05-29 ENCOUNTER — Other Ambulatory Visit (HOSPITAL_COMMUNITY): Payer: Self-pay | Admitting: Student

## 2018-06-26 ENCOUNTER — Other Ambulatory Visit (HOSPITAL_COMMUNITY): Payer: Self-pay | Admitting: Student

## 2018-08-29 ENCOUNTER — Other Ambulatory Visit (HOSPITAL_COMMUNITY): Payer: Self-pay | Admitting: Cardiology

## 2018-11-15 ENCOUNTER — Other Ambulatory Visit (HOSPITAL_COMMUNITY): Payer: Self-pay | Admitting: Cardiology

## 2018-11-24 ENCOUNTER — Other Ambulatory Visit (HOSPITAL_COMMUNITY): Payer: Self-pay | Admitting: Cardiology

## 2019-02-08 ENCOUNTER — Other Ambulatory Visit (HOSPITAL_COMMUNITY): Payer: Self-pay | Admitting: Cardiology

## 2019-02-24 ENCOUNTER — Other Ambulatory Visit (HOSPITAL_COMMUNITY): Payer: Self-pay | Admitting: Cardiology

## 2019-05-13 ENCOUNTER — Other Ambulatory Visit (HOSPITAL_COMMUNITY): Payer: Self-pay | Admitting: Cardiology

## 2019-05-27 ENCOUNTER — Other Ambulatory Visit (HOSPITAL_COMMUNITY): Payer: Self-pay | Admitting: Cardiology

## 2019-06-21 ENCOUNTER — Other Ambulatory Visit (HOSPITAL_COMMUNITY): Payer: Self-pay | Admitting: Cardiology

## 2019-07-14 ENCOUNTER — Other Ambulatory Visit (HOSPITAL_COMMUNITY): Payer: Self-pay | Admitting: Cardiology

## 2019-08-16 ENCOUNTER — Other Ambulatory Visit (HOSPITAL_COMMUNITY): Payer: Self-pay | Admitting: Cardiology

## 2019-09-14 ENCOUNTER — Other Ambulatory Visit (HOSPITAL_COMMUNITY): Payer: Self-pay | Admitting: Cardiology

## 2019-10-17 ENCOUNTER — Other Ambulatory Visit (HOSPITAL_COMMUNITY): Payer: Self-pay | Admitting: Cardiology

## 2019-10-25 ENCOUNTER — Other Ambulatory Visit (HOSPITAL_COMMUNITY): Payer: Self-pay | Admitting: Cardiology

## 2019-11-15 ENCOUNTER — Other Ambulatory Visit (HOSPITAL_COMMUNITY): Payer: Self-pay | Admitting: Cardiology

## 2019-11-29 ENCOUNTER — Other Ambulatory Visit (HOSPITAL_COMMUNITY): Payer: Self-pay | Admitting: Cardiology

## 2019-11-30 ENCOUNTER — Other Ambulatory Visit (HOSPITAL_COMMUNITY): Payer: Self-pay | Admitting: Cardiology

## 2019-12-02 ENCOUNTER — Other Ambulatory Visit: Payer: Self-pay

## 2019-12-02 ENCOUNTER — Ambulatory Visit (HOSPITAL_COMMUNITY)
Admission: RE | Admit: 2019-12-02 | Discharge: 2019-12-02 | Disposition: A | Discharge status: Home / Self Care | Payer: Medicare HMO | Source: Ambulatory Visit | Attending: Cardiology | Admitting: Cardiology

## 2019-12-02 DIAGNOSIS — I5022 Chronic systolic (congestive) heart failure: Secondary | ICD-10-CM

## 2019-12-02 DIAGNOSIS — I272 Pulmonary hypertension, unspecified: Secondary | ICD-10-CM

## 2019-12-02 DIAGNOSIS — G4719 Other hypersomnia: Secondary | ICD-10-CM

## 2019-12-02 DIAGNOSIS — G4733 Obstructive sleep apnea (adult) (pediatric): Secondary | ICD-10-CM

## 2019-12-02 NOTE — Patient Instructions (Signed)
Labs needed: an order has been faxed to [facility] for you  Your physician has recommended that you have a sleep study. This test records several body functions during sleep, including: brain activity, eye movement, oxygen and carbon dioxide blood levels, heart rate and rhythm, breathing rate and rhythm, the flow of air through your mouth and nose, snoring, body muscle movements, and chest and belly movement. WE WILL WORK WITH YOUR INSURANCE COMPANY TO SEE IF THEY WILL APPROVE THE HOME TEST OR THE IN-LAB TEST AND THEN WE WILL BE IN TOUCH WITH YOU TO SCHEDULE  Please call our office in March 2022 to schedule your follow up appointment  If you have any questions or concerns before your next appointment please send [place] a message through [place] or call our office at [phone].    TO LEAVE A MESSAGE FOR THE NURSE SELECT OPTION 2, PLEASE LEAVE A MESSAGE INCLUDING: . YOUR NAME . DATE OF BIRTH . CALL BACK NUMBER . REASON FOR CALL**this is important as we prioritize the call backs  [place] AS LONG AS YOU CALL BEFORE 4:00 PM  At the [facility], you and your health needs are our priority. As part of our continuing mission to provide you with exceptional heart care, we have created designated Provider Care Teams. These Care Teams include your primary Cardiologist (physician) and Advanced Practice Providers (APPs- Physician Assistants and Nurse Practitioners) who all work together to provide you with the care you need, when you need it.   You may see any of the following providers on your designated Care Team at your next follow up: Marland Kitchen Dr Glori Bickers . Dr [name] . Darrick Grinder, NP . Lyda Jester, PA . Audry Riles, PharmD   Please be sure to bring in all your medications bottles to every appointment.

## 2019-12-02 NOTE — Progress Notes (Signed)
Orders per Dr Aundra Dubin: 1. Sleep study-->itmar or in lab whichever insurance will cover 2. Labs soon-->bmet, lipid, cbc 3. F/U 6 months  Spoke w/pt, she request to labs done at [facility], order faxed to them at [phone], she is aware to call our office in March 2022 to schedule a follow up appointment. We discussed sleep study and completed STOPBANG, will see if insurance will approve Itamar test  Height: 5'3"    Weight: 350 lb BMI: 62  Today's Date: 12/02/19  STOP BANG RISK ASSESSMENT S (snore) Have you been told that you snore?     YES   T (tired) Are you often tired, fatigued, or sleepy during the day?   YES  O (obstruction) Do you stop breathing, choke, or gasp during sleep? NO   P (pressure) Do you have or are you being treated for high blood pressure? YES   B (BMI) Is your body index greater than 35 kg/m? YES   A (age) Are you 66 years old or older? YES   N (neck) Do you have a neck circumference greater than 16 inches?      G (gender) Are you a female? NO   TOTAL STOP/BANG "YES" ANSWERS 5                                                                       For Office Use Only              Procedure Order Form    YES to 3+ Stop Bang questions OR two clinical symptoms - patient qualifies for WatchPAT (CPT 95800)      Clinical Notes: Will consult Sleep Specialist and refer for management of therapy due to patient increased risk of Sleep Apnea. Ordering a sleep study due to the following two clinical symptoms: Excessive daytime sleepiness G47.10 / Loud snoring R06.83

## 2019-12-04 NOTE — Progress Notes (Signed)
Patient ID: Phyllis Todd, female   DOB: 1950-04-15, 69 y.o.   MRN: [id number]      Heart Failure TeleHealth Note  Due to national recommendations of social distancing due to [place] 19, Audio/video telehealth visit is felt to be most appropriate for this patient at this time.  See MyChart message from today for patient consent regarding telehealth for [facility].  Date:  12/04/2019   ID:  Phyllis Todd, DOB [birth year], MRN [id number]  Location: Home  Provider location: [place] Advanced Heart Failure Type of Visit: Established patient   PCP:  Rubbie Battiest, RN (Inactive)  Cardiologist:  Dr. Aundra Dubin  Chief Complaint: Shortness of breath   History of Present Illness: Phyllis Todd is a 69 y.o. female who presents via audio/video conferencing for a telehealth visit today.     she denies symptoms worrisome for COVID 19.   Patient has a history of systolic heart failure (nonischemic cardiomyopathy), HTN, and NASH. Admitted to [facility] in 5/15 with increased dyspnea and edema and diagnosed wth new acute systolic heart failure.  Echo (5/15) showed EF 35-40% with moderately dilated RV.  Diuresed with IV lasix and later transitioned to lasix 40 mg po bid. She was placed on carvedilol, losartan, and spironolactone. She lost considerable weight in the hospital.   Echo in 11/15 showed EF up to 55% with moderate LVH.  Echo in 10/19 showed EF 55-60%, moderate diastolic dysfunction, mildly dilated RV with normal systolic function, mildly D-shaped interventricular septum with evidence for RV pressure/volume overload.   She has been symptomatically stable.  Short of breath walking up a hill but no dyspnea walking on flat ground.  No chest pain.  No orthopnea/PND.  She is taking Lasix 40 mg daily alternating with 20 mg daily.  SBP running 120s-140 at home.  She reports stable weight.     Labs 07/13/13 K 4.7 Creatinine 1.28  Labs 07/17/13 K 3.6 => 4 Creatinine 1.1 => 1.17 Pro BNP 4574 => 3081 Labs 7/15 K 4.7,  creatinine 1.43 Labs 8/15 BNP 18.9' Labs 11/12/13 K 4.5 Creatinine 1.98 Labs 11/25/13 K 5.0 Creatinine 1.77 Lasix was cut back to 40 mg daily.  Labs 12/15: K 4.7, creatinine 1.68 Labs 4/18: K 4.4, creatinine 1.47 Labs 1/20: K 4.8, creatinine 1.6  SH: Lives with her father in [place]. Does not smoke or drink alcohol  FH: Mother CVA        Father CAD, HTN  Review of systems complete and found to be negative unless listed in HPI.   PMH: 1. HTN 2. Morbid obesity 3. Chronic systolic CHF: Nonischemic cardiomyopathy.  Echo (5/15) with EF 35-40%, moderate LAE, moderately dilated RV.  RHC/LHC (6/15) with mean RA 26, PA 61/33 mean 44, mean PCWP 24, CI 3.1, PVR 3.6 WU; simultaneous LV/RV catheters not suggestive of constriction; no CAD on coronary angiography.  Echo (11/15) with EF 55%, moderate LVH, normal RV size and systolic function.  - Echo in 10/19 showed EF 55-60%, moderate diastolic dysfunction, mildly dilated RV with normal systolic function, mildly D-shaped interventricular septum with evidence for RV pressure/volume overload.  4. ACEI cough 5. NASH: Abdominal US (5/15) with fatty liver versus cirrhosis.  6. Nocturnal desaturation on overnight oximetry.  7. CKD stage 3  Current Outpatient Medications  Medication Sig Dispense Refill  . acetaminophen (TYLENOL) 500 MG tablet Take 500 mg by mouth every 6 (six) hours as needed (pain).    Marland Kitchen aspirin EC 81 MG EC tablet Take 1 tablet (81  mg total) by mouth daily.    . carvedilol (COREG) 25 MG tablet TAKE (1) TABLET TWICE A DAY WITH FOOD---BREAKFAST AND SUPPER. 180 tablet 3  . diphenhydrAMINE (BENADRYL) 25 mg capsule Take 25 mg by mouth every 8 (eight) hours as needed for allergies.     . furosemide (LASIX) 40 MG tablet Take 1 tablet (40 mg total) by mouth daily. Must have office visit please call [phone] 30 tablet 0  . losartan (COZAAR) 50 MG tablet TAKE 1 TABLET BY MOUTH TWICE DAILY 60 tablet 0  . Multiple Vitamin (MULTIVITAMIN  WITH MINERALS) TABS tablet Take 1 tablet by mouth daily.    Marland Kitchen spironolactone (ALDACTONE) 25 MG tablet TAKE 1 TABLET BY MOUTH ONCE DAILY. 10 tablet 0   No current facility-administered medications for this encounter.    PHYSICAL EXAM: There were no vitals filed for this visit. Wt Readings from Last 3 Encounters:  11/24/17 (!) 161.8 kg (356 lb 12.8 oz)  05/03/16 (!) 157.2 kg (346 lb 8 oz)  04/12/14 (!) 146.1 kg (322 lb)    Exam:  (Video/Tele Health Call; Exam is subjective and or/visual.) General:  Speaks in full sentences. No resp difficulty. Lungs: Normal respiratory effort with conversation.  Abdomen: Non-distended per patient report Extremities: Pt denies edema. Neuro: Alert & oriented x 3.   ASSESSMENT & PLAN: 1. Chronic Biventricular Systolic Heart Failure:  Nonischemic cardiomyopathy.  EF up to 55% on 11/15 echo (improved).  Repeat echo was done in 10/19 showing EF 55-60% with mildly dilated RV and D-shaped interventricular septum.  This suggests a degree of RV failure. I strongly suspect OSA, and think this may play a role in her RV failure. Stable NYHA class II symptoms.  - Continue lasix 40 daily alternating with 20 mg daily.  Arrange for BMET and will need q3 months in the future given spironolactone use.   - Continue losartan 50 mg BID.  - Continue spironolactone 25 mg daily.   - Continue coreg 25 mg BID.      2. HTN: BP fairly well-controlled on home readings.   3. Pulmonary HTN with mild RV dysfunction: OSA may be playing role, she has daytime sleepiness and fatigue.  CT chest negative for PE in 5/15. - I will arrange for a sleep study.   4. Obesity: Continue efforts at diet/weight loss.          COVID screen The patient does not have any symptoms that suggest any further testing/ screening at this time.  Social distancing reinforced today.  Patient Risk: After full review of this patients clinical status, I feel that they are at moderate risk for cardiac decompensation  at this time.  Relevant cardiac medications were reviewed at length with the patient today. The patient does not have concerns regarding their medications at this time.   Recommended follow-up:  6 months.   Today, I have spent 15 minutes with the patient with telehealth technology discussing the above issues .    Signed, [name], MD  12/04/2019  [place] [address] [facility] [place] [address] [phone] (office) [phone] (fax)

## 2019-12-06 ENCOUNTER — Emergency Department (HOSPITAL_COMMUNITY): Payer: Medicare HMO

## 2019-12-06 ENCOUNTER — Other Ambulatory Visit: Payer: Self-pay

## 2019-12-06 ENCOUNTER — Inpatient Hospital Stay (HOSPITAL_COMMUNITY)
Admission: EM | Admit: 2019-12-06 | Discharge: 2019-12-17 | DRG: 871 | Disposition: A | Discharge status: Skilled Nursing Facility | Payer: Medicare HMO | Attending: Family Medicine | Admitting: Family Medicine

## 2019-12-06 ENCOUNTER — Encounter (HOSPITAL_COMMUNITY): Payer: Self-pay | Admitting: Emergency Medicine

## 2019-12-06 DIAGNOSIS — I959 Hypotension, unspecified: Secondary | ICD-10-CM | POA: Diagnosis not present

## 2019-12-06 DIAGNOSIS — R1084 Generalized abdominal pain: Secondary | ICD-10-CM

## 2019-12-06 DIAGNOSIS — E8809 Other disorders of plasma-protein metabolism, not elsewhere classified: Secondary | ICD-10-CM | POA: Diagnosis present

## 2019-12-06 DIAGNOSIS — Z6841 Body Mass Index (BMI) 40.0 and over, adult: Secondary | ICD-10-CM | POA: Diagnosis not present

## 2019-12-06 DIAGNOSIS — K7581 Nonalcoholic steatohepatitis (NASH): Secondary | ICD-10-CM | POA: Diagnosis present

## 2019-12-06 DIAGNOSIS — A419 Sepsis, unspecified organism: Secondary | ICD-10-CM

## 2019-12-06 DIAGNOSIS — N1831 Chronic kidney disease, stage 3a: Secondary | ICD-10-CM | POA: Diagnosis present

## 2019-12-06 DIAGNOSIS — I2729 Other secondary pulmonary hypertension: Secondary | ICD-10-CM | POA: Diagnosis present

## 2019-12-06 DIAGNOSIS — Z823 Family history of stroke: Secondary | ICD-10-CM

## 2019-12-06 DIAGNOSIS — Z789 Other specified health status: Secondary | ICD-10-CM

## 2019-12-06 DIAGNOSIS — A4151 Sepsis due to Escherichia coli [E. coli]: Secondary | ICD-10-CM | POA: Diagnosis present

## 2019-12-06 DIAGNOSIS — I5042 Chronic combined systolic (congestive) and diastolic (congestive) heart failure: Secondary | ICD-10-CM | POA: Diagnosis present

## 2019-12-06 DIAGNOSIS — K659 Peritonitis, unspecified: Secondary | ICD-10-CM

## 2019-12-06 DIAGNOSIS — Z20822 Contact with and (suspected) exposure to covid-19: Secondary | ICD-10-CM | POA: Diagnosis present

## 2019-12-06 DIAGNOSIS — D649 Anemia, unspecified: Secondary | ICD-10-CM | POA: Diagnosis not present

## 2019-12-06 DIAGNOSIS — N200 Calculus of kidney: Secondary | ICD-10-CM | POA: Diagnosis not present

## 2019-12-06 DIAGNOSIS — Z885 Allergy status to narcotic agent status: Secondary | ICD-10-CM

## 2019-12-06 DIAGNOSIS — E860 Dehydration: Secondary | ICD-10-CM | POA: Diagnosis present

## 2019-12-06 DIAGNOSIS — I10 Essential (primary) hypertension: Secondary | ICD-10-CM | POA: Diagnosis present

## 2019-12-06 DIAGNOSIS — I130 Hypertensive heart and chronic kidney disease with heart failure and stage 1 through stage 4 chronic kidney disease, or unspecified chronic kidney disease: Secondary | ICD-10-CM | POA: Diagnosis present

## 2019-12-06 DIAGNOSIS — Z8249 Family history of ischemic heart disease and other diseases of the circulatory system: Secondary | ICD-10-CM | POA: Diagnosis not present

## 2019-12-06 DIAGNOSIS — I5022 Chronic systolic (congestive) heart failure: Secondary | ICD-10-CM | POA: Diagnosis not present

## 2019-12-06 DIAGNOSIS — N170 Acute kidney failure with tubular necrosis: Secondary | ICD-10-CM | POA: Diagnosis present

## 2019-12-06 DIAGNOSIS — D7589 Other specified diseases of blood and blood-forming organs: Secondary | ICD-10-CM | POA: Diagnosis present

## 2019-12-06 DIAGNOSIS — Z888 Allergy status to other drugs, medicaments and biological substances status: Secondary | ICD-10-CM

## 2019-12-06 DIAGNOSIS — K651 Peritoneal abscess: Secondary | ICD-10-CM | POA: Diagnosis not present

## 2019-12-06 DIAGNOSIS — Z452 Encounter for adjustment and management of vascular access device: Secondary | ICD-10-CM | POA: Diagnosis not present

## 2019-12-06 DIAGNOSIS — K5720 Diverticulitis of large intestine with perforation and abscess without bleeding: Secondary | ICD-10-CM | POA: Diagnosis present

## 2019-12-06 DIAGNOSIS — N1832 Chronic kidney disease, stage 3b: Secondary | ICD-10-CM | POA: Diagnosis not present

## 2019-12-06 DIAGNOSIS — R531 Weakness: Secondary | ICD-10-CM | POA: Diagnosis not present

## 2019-12-06 DIAGNOSIS — E872 Acidosis: Secondary | ICD-10-CM | POA: Diagnosis present

## 2019-12-06 DIAGNOSIS — D72829 Elevated white blood cell count, unspecified: Secondary | ICD-10-CM | POA: Diagnosis not present

## 2019-12-06 DIAGNOSIS — R6521 Severe sepsis with septic shock: Secondary | ICD-10-CM | POA: Diagnosis present

## 2019-12-06 DIAGNOSIS — G4733 Obstructive sleep apnea (adult) (pediatric): Secondary | ICD-10-CM | POA: Diagnosis present

## 2019-12-06 DIAGNOSIS — R52 Pain, unspecified: Secondary | ICD-10-CM | POA: Diagnosis not present

## 2019-12-06 DIAGNOSIS — N179 Acute kidney failure, unspecified: Secondary | ICD-10-CM | POA: Diagnosis not present

## 2019-12-06 DIAGNOSIS — A4189 Other specified sepsis: Secondary | ICD-10-CM | POA: Diagnosis not present

## 2019-12-06 DIAGNOSIS — I493 Ventricular premature depolarization: Secondary | ICD-10-CM | POA: Diagnosis not present

## 2019-12-06 DIAGNOSIS — I428 Other cardiomyopathies: Secondary | ICD-10-CM | POA: Diagnosis present

## 2019-12-06 DIAGNOSIS — I5040 Unspecified combined systolic (congestive) and diastolic (congestive) heart failure: Secondary | ICD-10-CM | POA: Diagnosis not present

## 2019-12-06 DIAGNOSIS — E875 Hyperkalemia: Secondary | ICD-10-CM | POA: Diagnosis present

## 2019-12-06 DIAGNOSIS — Z978 Presence of other specified devices: Secondary | ICD-10-CM | POA: Diagnosis not present

## 2019-12-06 DIAGNOSIS — D6489 Other specified anemias: Secondary | ICD-10-CM | POA: Diagnosis present

## 2019-12-06 DIAGNOSIS — K5730 Diverticulosis of large intestine without perforation or abscess without bleeding: Secondary | ICD-10-CM | POA: Diagnosis not present

## 2019-12-06 DIAGNOSIS — L0291 Cutaneous abscess, unspecified: Secondary | ICD-10-CM

## 2019-12-06 DIAGNOSIS — Z803 Family history of malignant neoplasm of breast: Secondary | ICD-10-CM

## 2019-12-06 DIAGNOSIS — Z7982 Long term (current) use of aspirin: Secondary | ICD-10-CM

## 2019-12-06 DIAGNOSIS — I429 Cardiomyopathy, unspecified: Secondary | ICD-10-CM | POA: Diagnosis not present

## 2019-12-06 DIAGNOSIS — Z7401 Bed confinement status: Secondary | ICD-10-CM | POA: Diagnosis not present

## 2019-12-06 DIAGNOSIS — J986 Disorders of diaphragm: Secondary | ICD-10-CM | POA: Diagnosis not present

## 2019-12-06 DIAGNOSIS — Z833 Family history of diabetes mellitus: Secondary | ICD-10-CM

## 2019-12-06 DIAGNOSIS — M2550 Pain in unspecified joint: Secondary | ICD-10-CM | POA: Diagnosis not present

## 2019-12-06 DIAGNOSIS — Z79899 Other long term (current) drug therapy: Secondary | ICD-10-CM

## 2019-12-06 DIAGNOSIS — R6520 Severe sepsis without septic shock: Secondary | ICD-10-CM | POA: Diagnosis not present

## 2019-12-06 LAB — LIPASE, BLOOD: Lipase: 28 U/L (ref 11–51)

## 2019-12-06 LAB — CBC WITH DIFFERENTIAL/PLATELET
Abs Immature Granulocytes: 0 10*3/uL (ref 0.00–0.07)
Band Neutrophils: 2 %
Basophils Absolute: 0 10*3/uL (ref 0.0–0.1)
Basophils Relative: 0 %
Eosinophils Absolute: 0.6 10*3/uL — ABNORMAL HIGH (ref 0.0–0.5)
Eosinophils Relative: 2 %
HCT: 34.4 % — ABNORMAL LOW (ref 36.0–46.0)
Hemoglobin: 10.6 g/dL — ABNORMAL LOW (ref 12.0–15.0)
Lymphocytes Relative: 6 %
Lymphs Abs: 1.9 10*3/uL (ref 0.7–4.0)
MCH: 32.3 pg (ref 26.0–34.0)
MCHC: 30.8 g/dL (ref 30.0–36.0)
MCV: 104.9 fL — ABNORMAL HIGH (ref 80.0–100.0)
Monocytes Absolute: 2.5 10*3/uL — ABNORMAL HIGH (ref 0.1–1.0)
Monocytes Relative: 8 %
Neutro Abs: 26.5 10*3/uL — ABNORMAL HIGH (ref 1.7–7.7)
Neutrophils Relative %: 82 %
Platelets: 439 10*3/uL — ABNORMAL HIGH (ref 150–400)
RBC: 3.28 MIL/uL — ABNORMAL LOW (ref 3.87–5.11)
RDW: 13.2 % (ref 11.5–15.5)
WBC: 31.5 10*3/uL — ABNORMAL HIGH (ref 4.0–10.5)
nRBC: 0 % (ref 0.0–0.2)

## 2019-12-06 LAB — COMPREHENSIVE METABOLIC PANEL
ALT: 11 U/L (ref 0–44)
AST: 13 U/L — ABNORMAL LOW (ref 15–41)
Albumin: 2.3 g/dL — ABNORMAL LOW (ref 3.5–5.0)
Alkaline Phosphatase: 112 U/L (ref 38–126)
Anion gap: 18 — ABNORMAL HIGH (ref 5–15)
BUN: 105 mg/dL — ABNORMAL HIGH (ref 8–23)
CO2: 22 mmol/L (ref 22–32)
Calcium: 11.5 mg/dL — ABNORMAL HIGH (ref 8.9–10.3)
Chloride: 94 mmol/L — ABNORMAL LOW (ref 98–111)
Creatinine, Ser: 5.75 mg/dL — ABNORMAL HIGH (ref 0.44–1.00)
GFR, Estimated: 8 mL/min — ABNORMAL LOW (ref >60)
Glucose, Bld: 126 mg/dL — ABNORMAL HIGH (ref 70–99)
Potassium: 5.5 mmol/L — ABNORMAL HIGH (ref 3.5–5.1)
Sodium: 134 mmol/L — ABNORMAL LOW (ref 135–145)
Total Bilirubin: 1.2 mg/dL (ref 0.3–1.2)
Total Protein: 7.3 g/dL (ref 6.5–8.1)

## 2019-12-06 LAB — RESPIRATORY PANEL BY RT PCR (FLU A&B, COVID)
Influenza A by PCR: NEGATIVE
Influenza B by PCR: NEGATIVE
SARS Coronavirus 2 by RT PCR: NEGATIVE

## 2019-12-06 MED ORDER — LACTATED RINGERS IV BOLUS
1000.0000 mL | Freq: Once | INTRAVENOUS | Status: AC
  Administered 2019-12-06: 1000 mL via INTRAVENOUS

## 2019-12-06 MED ORDER — LACTATED RINGERS IV BOLUS
1000.0000 mL | Freq: Once | INTRAVENOUS | Status: AC
  Administered 2019-12-07: 1000 mL via INTRAVENOUS

## 2019-12-06 MED ORDER — LACTATED RINGERS IV SOLN: INTRAVENOUS | Status: AC

## 2019-12-06 MED ORDER — SODIUM CHLORIDE 0.9 % IV SOLN
2.0000 g | Freq: Once | INTRAVENOUS | Status: AC
  Administered 2019-12-06: 2 g via INTRAVENOUS
  Filled 2019-12-06: qty 2

## 2019-12-06 MED ORDER — SODIUM CHLORIDE 0.9 % IV BOLUS
500.0000 mL | Freq: Once | INTRAVENOUS | Status: AC
  Administered 2019-12-06: 500 mL via INTRAVENOUS

## 2019-12-06 MED ORDER — HYDROMORPHONE HCL 1 MG/ML IJ SOLN
0.5000 mg | INTRAMUSCULAR | Status: DC | PRN
  Administered 2019-12-07: 0.5 mg via INTRAVENOUS
  Filled 2019-12-06: qty 1

## 2019-12-06 MED ORDER — ONDANSETRON HCL 4 MG/2ML IJ SOLN
4.0000 mg | Freq: Four times a day (QID) | INTRAMUSCULAR | Status: DC | PRN
  Administered 2019-12-07 – 2019-12-17 (×5): 4 mg via INTRAVENOUS
  Filled 2019-12-06 (×5): qty 2

## 2019-12-06 MED ORDER — METRONIDAZOLE IN NACL 5-0.79 MG/ML-% IV SOLN
500.0000 mg | Freq: Once | INTRAVENOUS | Status: AC
  Administered 2019-12-06: 500 mg via INTRAVENOUS
  Filled 2019-12-06: qty 100

## 2019-12-06 MED ORDER — DEXTROSE-NACL 5-0.45 % IV SOLN: INTRAVENOUS | Status: DC

## 2019-12-06 NOTE — ED Notes (Signed)
EDP at bedside aware BP 68/50.

## 2019-12-06 NOTE — ED Triage Notes (Signed)
Patient reports low abdominal pain with diarrhea and emesis onset last week with fatigue and generalized weakness , hypotensive at triage , alert/respirations unlabored .

## 2019-12-06 NOTE — ED Provider Notes (Signed)
[facility] EMERGENCY DEPARTMENT Provider Note   CSN: [id number] Arrival date & time: 12/06/19  1638     History Chief Complaint  Patient presents with  . Abdominal Pain    Hypotension    Phyllis Todd is a 69 y.o. female.  The history is provided by the patient.  Abdominal Pain Pain location:  RLQ and LLQ Pain radiates to:  Does not radiate Pain severity:  Mild Duration:  1 week Chronicity:  New Associated symptoms: diarrhea, dysuria, nausea and vomiting   Associated symptoms: no chest pain, no chills, no cough, no fever, no hematuria, no shortness of breath and no sore throat        Past Medical History:  Diagnosis Date  . 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 Acute renal failure ([place])   . BMI 60.0-69.9, adult ([place])   . CHF (congestive heart failure) ([place]) 06/2013  . Hypertension   . Morbid obesity ([place])   . NICM (nonischemic cardiomyopathy) ([place]) 06/2013  . Obstructive sleep apnea (adult) (pediatric) 06/2013  . Other ascites 06/2013  . Other chronic nonalcoholic liver disease 05/6657    Patient Active Problem List   Diagnosis Date Noted  . Morbid obesity ([place]) 05/03/2016  . Pulmonary HTN ([place]) 01/04/2014  . RVF (right ventricular failure) ([place]) 01/04/2014  . Chronic systolic CHF (congestive heart failure) ([place]) 09/01/2013  . OSA (obstructive sleep apnea) 07/21/2013  . Non-ischemic cardiomyopathy ([place]) 07/13/2013  . Hypertensive heart disease 07/10/2013  . Essential hypertension 07/07/2013    Past Surgical History:  Procedure Laterality Date  . CARDIAC CATHETERIZATION  2015  . CESAREAN SECTION    . DILATION AND CURETTAGE OF UTERUS  1984  . LEFT HEART CATHETERIZATION WITH CORONARY ANGIOGRAM N/A 07/12/2013   Procedure: LEFT HEART CATHETERIZATION WITH CORONARY ANGIOGRAM;  Surgeon: Troy Sine, MD;  Location: [facility] CATH LAB;  Service: Cardiovascular;  Laterality: N/A;  . TONSILLECTOMY        OB History   No obstetric history on file.     Family History  Problem Relation Age of Onset  . Hypertension Mother   . Heart failure Mother   . Stroke Mother   . CAD Father   . Hypertension Father   . Stroke Brother 67  . Diabetes Brother   . Hypertension Brother   . Breast cancer Maternal Grandmother     Social History   Tobacco Use  . Smoking status: Never Smoker  . Smokeless tobacco: Never Used  Substance Use Topics  . Alcohol use: Yes    Comment: occas  . Drug use: No    Home Medications Prior to Admission medications   Medication Sig Start Date End Date Taking? Authorizing Provider  acetaminophen (TYLENOL) 500 MG tablet Take 500 mg by mouth every 6 (six) hours as needed (pain).   Yes [provider]  aspirin EC 81 MG EC tablet Take 1 tablet (81 mg total) by mouth daily. 07/13/13  Yes Charlynne Cousins, MD  carvedilol (COREG) 25 MG tablet TAKE (1) TABLET TWICE A DAY WITH FOOD---BREAKFAST AND SUPPER. Patient taking differently: Take 25 mg by mouth 2 (two) times daily with a meal.  02/24/19  Yes Larey Dresser, MD  furosemide (LASIX) 40 MG tablet Take 1 tablet (40 mg total) by mouth daily. Must have office visit please call [phone] 10/25/19  Yes Larey Dresser, MD  losartan (COZAAR) 50 MG tablet TAKE 1  TABLET BY MOUTH TWICE DAILY 11/30/19  Yes Larey Dresser, MD  spironolactone (ALDACTONE) 25 MG tablet TAKE 1 TABLET BY MOUTH ONCE DAILY. 11/30/19  Yes Larey Dresser, MD    Allergies    Codeine, Demerol [meperidine], Lisinopril, and Peanut-containing drug products  Review of Systems   Review of Systems  Constitutional: Negative for chills and fever.  HENT: Negative for congestion and sore throat.   Respiratory: Negative for cough and shortness of breath.   Cardiovascular: Negative for chest pain and leg swelling.  Gastrointestinal: Positive for abdominal pain, diarrhea, nausea and vomiting.  Genitourinary: Positive for dysuria. Negative  for hematuria.  Musculoskeletal: Negative for back pain and neck pain.  Skin: Negative for rash.  Neurological: Positive for light-headedness. Negative for headaches.  All other systems reviewed and are negative.   Physical Exam Updated Vital Signs BP (!) 102/52   Pulse 90   Temp 97.9 F (36.6 C) (Oral)   Resp 20   Ht 5\' 4"  (1.626 m)   Wt (!) 152 kg   SpO2 96%   BMI 57.52 kg/m   Physical Exam Vitals reviewed.  Constitutional:      General: She is not in acute distress.    Appearance: She is obese.  HENT:     Head: Normocephalic and atraumatic.     Nose: Nose normal.     Mouth/Throat:     Mouth: Mucous membranes are moist.     Pharynx: Oropharynx is clear.  Eyes:     Conjunctiva/sclera: Conjunctivae normal.  Cardiovascular:     Rate and Rhythm: Normal rate.     Heart sounds: Normal heart sounds.  Pulmonary:     Effort: Pulmonary effort is normal. No respiratory distress.     Breath sounds: Normal breath sounds. No wheezing, rhonchi or rales.  Abdominal:     General: Abdomen is protuberant.     Palpations: Abdomen is soft.     Tenderness: There is abdominal tenderness in the right lower quadrant.  Musculoskeletal:     Right lower leg: No edema.     Left lower leg: No edema.  Skin:    General: Skin is warm and dry.     Coloration: Skin is not cyanotic or jaundiced.     Comments: Erythema in skin fold under breast and below pannus - dried powder in place  Neurological:     General: No focal deficit present.     Mental Status: She is alert.  Psychiatric:        Mood and Affect: Mood normal.        Behavior: Behavior normal.     ED Results / Procedures / Treatments   Labs (all labs ordered are listed, but only abnormal results are displayed) Labs Reviewed  CBC WITH DIFFERENTIAL/PLATELET - Abnormal; Notable for the following components:      Result Value   WBC 31.5 (*)    RBC 3.28 (*)    Hemoglobin 10.6 (*)    HCT 34.4 (*)    MCV 104.9 (*)    Platelets  439 (*)    Neutro Abs 26.5 (*)    Monocytes Absolute 2.5 (*)    Eosinophils Absolute 0.6 (*)    All other components within normal limits  COMPREHENSIVE METABOLIC PANEL - Abnormal; Notable for the following components:   Sodium 134 (*)    Potassium 5.5 (*)    Chloride 94 (*)    Glucose, Bld 126 (*)    BUN 105 (*)  Creatinine, Ser 5.75 (*)    Calcium 11.5 (*)    Albumin 2.3 (*)    AST 13 (*)    GFR, Estimated 8 (*)    Anion gap 18 (*)    All other components within normal limits  RESPIRATORY PANEL BY RT PCR (FLU A&B, COVID)  CULTURE, BLOOD (ROUTINE X 2)  CULTURE, BLOOD (ROUTINE X 2)  LIPASE, BLOOD  URINALYSIS, ROUTINE W REFLEX MICROSCOPIC  LACTIC ACID, PLASMA  LACTIC ACID, PLASMA    EKG None  Radiology CT ABDOMEN PELVIS WO CONTRAST  Result Date: 12/06/2019 CLINICAL DATA:  Right lower quadrant abdominal pain EXAM: CT ABDOMEN AND PELVIS WITHOUT CONTRAST TECHNIQUE: Multidetector CT imaging of the abdomen and pelvis was performed following the standard protocol without IV contrast. COMPARISON:  None. FINDINGS: Lower chest: The visualized heart size within normal limits. No pericardial fluid/thickening. No hiatal hernia. The visualized portions of the lungs are clear. Hepatobiliary: Although limited due to the lack of intravenous contrast, normal in appearance without gross focal abnormality. No evidence of calcified gallstones or biliary ductal dilatation. Pancreas:  Unremarkable.  No surrounding inflammatory changes. Spleen: Normal in size. Although limited due to the lack of intravenous contrast, normal in appearance. Adrenals/Urinary Tract: Both adrenal glands appear normal. Right-sided renal calculi are noted the largest within the midpole measures 1 cm. No right-sided hydronephrosis. No left-sided renal or collecting system calculi are seen. Abutting the superior surface of the bladder is the loculated pericolonic collection with mild wall thickening at the superior bladder which  may be from the adjacent inflammatory changes. Stomach/Bowel: The stomach and proximal small bowel are unremarkable. Within the mid abdomen within a mid ileal loops there appears to be diffuse wall thickening and significant surrounding inflammatory changes. There is an adjacent air and fluid loculated collection measuring approximately 11.0 x 5.9 by 9.3 cm. There is also fat stranding changes seen around the mid sigmoid colon with diverticula. The remainder of the colon is unremarkable. Vascular/Lymphatic: There are no enlarged abdominal or pelvic lymph nodes. Scattered mild aortic atherosclerosis is seen. Reproductive: The prostate is unremarkable. Coarse calcifications are seen within the prostate. Other: A small amount of pneumoperitoneum seen along the anterior abdominal wall and under the diaphragms. Musculoskeletal: No acute or significant osseous findings. IMPRESSION: Findings suggestive of extensive mid ileal enteritis and possible sigmoid colonic diverticulitis. There is an adjacent multilocular fluid collection consistent with an intra-abdominal abscess measuring 11.0 x 5.9 x 9.3 cm. Small amount of pneumoperitoneum Nonobstructing right renal calculi Aortic Atherosclerosis (ICD10-I70.0). These results were called by telephone at the time of interpretation on 12/06/2019 at 9:42 pm to provider who verbally acknowledged these results. Electronically Signed   By: Prudencio Pair M.D.   On: 12/06/2019 21:43   DG Chest Portable 1 View  Result Date: 12/06/2019 CLINICAL DATA:  Hypotension. Lower abdominal pain and diarrhea. Vomiting. EXAM: PORTABLE CHEST 1 VIEW COMPARISON:  Radiograph 07/17/2013. Included portions from lung basis of abdominal CT earlier today. FINDINGS: The cardiomediastinal contours are normal. Upper normal heart size. Mild elevation of right hemidiaphragm pulmonary vasculature is normal. No consolidation, pleural effusion, or pneumothorax. No acute osseous abnormalities are seen. Free air in  the abdomen on concurrent abdominal CT is not well demonstrated by radiograph. IMPRESSION: 1. No acute chest findings. 2. Mild chronic elevation of right hemidiaphragm. Electronically Signed   By: Keith Rake M.D.   On: 12/06/2019 21:46    Procedures Procedures (including critical care time)  Medications Ordered in ED Medications  lactated  ringers infusion (has no administration in time range)  dextrose 5 %-0.45 % sodium chloride infusion ( Intravenous Not Given 12/06/19 2355)  ondansetron (ZOFRAN) injection 4 mg (has no administration in time range)  HYDROmorphone (DILAUDID) injection 0.5-1 mg (has no administration in time range)  lactated ringers bolus 1,000 mL (has no administration in time range)  sodium chloride 0.9 % bolus 500 mL (0 mLs Intravenous Stopped 12/06/19 2059)  lactated ringers bolus 1,000 mL (0 mLs Intravenous Stopped 12/06/19 2148)  ceFEPIme (MAXIPIME) 2 g in sodium chloride 0.9 % 100 mL IVPB (0 g Intravenous Stopped 12/06/19 2330)  metroNIDAZOLE (FLAGYL) IVPB 500 mg (0 mg Intravenous Stopped 12/06/19 2358)  lactated ringers bolus 1,000 mL (1,000 mLs Intravenous New Bag/Given 12/06/19 2245)    ED Course  I have reviewed the triage vital signs and the nursing notes.  Pertinent labs & imaging results that were available during my care of the patient were reviewed by me and considered in my medical decision making (see chart for details).    MDM Rules/Calculators/A&P                          Medical Decision Making: Phyllis Todd is a 69 y.o. female who presented to the ED today with lower abdominal pain and vomiting for 1 week.   Past medical history significant for hx systolic heart failure (nonischemic cardiomyopathy- however recent EF 55% ), HTN, and NASH. Reviewed and confirmed nursing documentation for past medical history, family history, social history.  On my initial exam, the pt was awake and alert, normal mentation, soft abdomen, with mild general  lower abdominal tenderness, no rebound or guarding.   Severe AKI, Cr 5.75, prior 1.62, likely in setting of dehydration from ongoing vomiting and diarrhea. Leukocytosis with WBC 31.5, concerning for severe infection, CT abdomen, urine, and CXR obtained to identify source.  COVID negative CXR with no acute findings.   All radiology and laboratory studies reviewed independently and with my attending physician, agree with reading provided by radiologist unless otherwise noted.   CT shows enteritis in abdomen with large abscess  And evidence of perforation with free air.  Consulted general surgery Code sepsis called, empiric antibiotics given (cefepime and flagyl),  blood cultures obtained, lactic pending,  and resuscitative fluids given.   - Gen surgery to discuss drainage option with IR. If not amenable for IR drainage will need some procedure given size of abscess.  -ICU consulted for evaluation given initial hypotension and risk of decompensation, at this time MAPS >65 for 2 hours  Upon reassessing patient, patient was resting comfortably, reporting minimal pain at this time.   Pt admitted for further evaluation and monitoring.   The above care was discussed with and agreed upon by my attending physician. Emergency Department Medication Summary:  Medications  lactated ringers infusion (has no administration in time range)  dextrose 5 %-0.45 % sodium chloride infusion ( Intravenous Not Given 12/06/19 2355)  ondansetron (ZOFRAN) injection 4 mg (has no administration in time range)  HYDROmorphone (DILAUDID) injection 0.5-1 mg (has no administration in time range)  lactated ringers bolus 1,000 mL (has no administration in time range)  sodium chloride 0.9 % bolus 500 mL (0 mLs Intravenous Stopped 12/06/19 2059)  lactated ringers bolus 1,000 mL (0 mLs Intravenous Stopped 12/06/19 2148)  ceFEPIme (MAXIPIME) 2 g in sodium chloride 0.9 % 100 mL IVPB (0 g Intravenous Stopped 12/06/19 2330)   metroNIDAZOLE (FLAGYL) IVPB 500 mg (0  mg Intravenous Stopped 12/06/19 2358)  lactated ringers bolus 1,000 mL (1,000 mLs Intravenous New Bag/Given 12/06/19 2245)        Final Clinical Impression(s) / ED Diagnoses Final diagnoses:  Generalized abdominal pain  Abscess  AKI (acute kidney injury) ([place])  Dehydration  Hypotension, unspecified hypotension type    Rx / DC Orders ED Discharge Orders    None       Roosevelt Locks, MD 12/07/19 [place]    Merrily Pew, MD 12/07/19 2303

## 2019-12-06 NOTE — Consult Note (Signed)
Reason for Consult: septic shock Referring Physician: [name] is an 69 y.o. female.  HPI: 69 yo female with 4 days of abdominal pain. Pain is diffuse and constant. It is worse with movement. She has had little appetite. She has some nausea since coming into the ED but no vomiting. She had one previous episode like this but it only lasted a day and was not as severe.  Past Medical History:  Diagnosis Date  . 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 Acute renal failure ([place])   . BMI 60.0-69.9, adult ([place])   . CHF (congestive heart failure) ([place]) 06/2013  . Hypertension   . Morbid obesity ([place])   . NICM (nonischemic cardiomyopathy) ([place]) 06/2013  . Obstructive sleep apnea (adult) (pediatric) 06/2013  . Other ascites 06/2013  . Other chronic nonalcoholic liver disease 08/8586    Past Surgical History:  Procedure Laterality Date  . CARDIAC CATHETERIZATION  2015  . CESAREAN SECTION    . DILATION AND CURETTAGE OF UTERUS  1984  . LEFT HEART CATHETERIZATION WITH CORONARY ANGIOGRAM N/A 07/12/2013   Procedure: LEFT HEART CATHETERIZATION WITH CORONARY ANGIOGRAM;  Surgeon: Troy Sine, MD;  Location: [facility] CATH LAB;  Service: Cardiovascular;  Laterality: N/A;  . TONSILLECTOMY      Family History  Problem Relation Age of Onset  . Hypertension Mother   . Heart failure Mother   . Stroke Mother   . CAD Father   . Hypertension Father   . Stroke Brother 32  . Diabetes Brother   . Hypertension Brother   . Breast cancer Maternal Grandmother     Social History:  reports that she has never smoked. She has never used smokeless tobacco. She reports current alcohol use. She reports that she does not use drugs.  Allergies:  Allergies  Allergen Reactions  . Codeine Other (See Comments)    "makes me hyper"  . Demerol [Meperidine] Other (See Comments)    "sick"  . Lisinopril Cough  . Peanut-Containing Drug Products Hives  and Swelling    "almonds"    Medications: I have reviewed the patient's current medications.  Results for orders placed or performed during the hospital encounter of 12/06/19 (from the past 48 hour(s))  CBC with Differential     Status: Abnormal   Collection Time: 12/06/19  7:44 PM  Result Value Ref Range   WBC 31.5 (H) 4.0 - 10.5 K/uL   RBC 3.28 (L) 3.87 - 5.11 MIL/uL   Hemoglobin 10.6 (L) 12.0 - 15.0 g/dL   HCT 34.4 (L) 36 - 46 %   MCV 104.9 (H) 80.0 - 100.0 fL   MCH 32.3 26.0 - 34.0 pg   MCHC 30.8 30.0 - 36.0 g/dL   RDW 13.2 11.5 - 15.5 %   Platelets 439 (H) 150 - 400 K/uL   nRBC 0.0 0.0 - 0.2 %   Neutrophils Relative % 82 %   Neutro Abs 26.5 (H) 1.7 - 7.7 K/uL   Band Neutrophils 2 %   Lymphocytes Relative 6 %   Lymphs Abs 1.9 0.7 - 4.0 K/uL   Monocytes Relative 8 %   Monocytes Absolute 2.5 (H) 0.1 - 1.0 K/uL   Eosinophils Relative 2 %   Eosinophils Absolute 0.6 (H) 0.0 - 0.5 K/uL   Basophils Relative 0 %   Basophils Absolute 0.0 0.0 - 0.1 K/uL   WBC Morphology VACUOLATED NEUTROPHILS  Abs Immature Granulocytes 0.00 0.00 - 0.07 K/uL    Comment: Performed at [facility], [place] [address], [place], [place] [address]  Comprehensive metabolic panel     Status: Abnormal   Collection Time: 12/06/19  7:44 PM  Result Value Ref Range   Sodium 134 (L) 135 - 145 mmol/L   Potassium 5.5 (H) 3.5 - 5.1 mmol/L   Chloride 94 (L) 98 - 111 mmol/L   CO2 22 22 - 32 mmol/L   Glucose, Bld 126 (H) 70 - 99 mg/dL    Comment: Glucose reference range applies only to samples taken after fasting for at least 8 hours.   BUN 105 (H) 8 - 23 mg/dL   Creatinine, Ser 5.75 (H) 0.44 - 1.00 mg/dL   Calcium 11.5 (H) 8.9 - 10.3 mg/dL   Total Protein 7.3 6.5 - 8.1 g/dL   Albumin 2.3 (L) 3.5 - 5.0 g/dL   AST 13 (L) 15 - 41 U/L   ALT 11 0 - 44 U/L   Alkaline Phosphatase 112 38 - 126 U/L   Total Bilirubin 1.2 0.3 - 1.2 mg/dL   GFR, Estimated 8 (L) >60 mL/min    Comment: (NOTE) Calculated using the  CKD-EPI Creatinine Equation (2021)    Anion gap 18 (H) 5 - 15    Comment: Performed at [facility], [place] [address] [place], [place] [address]  Lipase, blood     Status: None   Collection Time: 12/06/19  7:44 PM  Result Value Ref Range   Lipase 28 11 - 51 U/L    Comment: Performed at [facility], [place] [address], [place], [place] [address]  Respiratory Panel by RT PCR (Flu A&B, Covid) - Nasopharyngeal Swab     Status: None   Collection Time: 12/06/19  8:10 PM   Specimen: Nasopharyngeal Swab  Result Value Ref Range   SARS Coronavirus 2 by RT PCR NEGATIVE NEGATIVE    Comment: (NOTE) SARS-CoV-2 target nucleic acids are NOT DETECTED.  The SARS-CoV-2 RNA is generally detectable in upper respiratoy specimens during the acute phase of infection. The lowest concentration of SARS-CoV-2 viral copies this assay can detect is 131 copies/mL. A negative result does not preclude SARS-Cov-2 infection and should not be used as the sole basis for treatment or other patient management decisions. A negative result may occur with  improper specimen collection/handling, submission of specimen other than nasopharyngeal swab, presence of viral mutation(s) within the areas targeted by this assay, and inadequate number of viral copies (<131 copies/mL). A negative result must be combined with clinical observations, patient history, and epidemiological information. The expected result is Negative.  Fact Sheet for Patients:  PinkCheek.be  Fact Sheet for Healthcare Providers:  GravelBags.it  This test is no t yet approved or cleared by the [place] FDA and  has been authorized for detection and/or diagnosis of SARS-CoV-2 by FDA under an Emergency Use Authorization (EUA). This EUA will remain  in effect (meaning this test can be used) for the duration of the COVID-19 declaration under Section 564(b)(1) of the Act, 21 U.S.C. section  360bbb-3(b)(1), unless the authorization is terminated or revoked sooner.     Influenza A by PCR NEGATIVE NEGATIVE   Influenza B by PCR NEGATIVE NEGATIVE    Comment: (NOTE) The Xpert Xpress SARS-CoV-2/FLU/RSV assay is intended as an aid in  the diagnosis of influenza from Nasopharyngeal swab specimens and  should not be used as a sole basis for treatment. Nasal  washings and  aspirates are unacceptable for Xpert Xpress SARS-CoV-2/FLU/RSV  testing.  Fact Sheet for Patients: PinkCheek.be  Fact Sheet for Healthcare Providers: GravelBags.it  This test is not yet approved or cleared by the [place] FDA and  has been authorized for detection and/or diagnosis of SARS-CoV-2 by  FDA under an Emergency Use Authorization (EUA). This EUA will remain  in effect (meaning this test can be used) for the duration of the  Covid-19 declaration under Section 564(b)(1) of the Act, 21  U.S.C. section 360bbb-3(b)(1), unless the authorization is  terminated or revoked. Performed at [facility], [place] [address], [place], [place] [address]     CT ABDOMEN PELVIS WO CONTRAST  Result Date: 12/06/2019 CLINICAL DATA:  Right lower quadrant abdominal pain EXAM: CT ABDOMEN AND PELVIS WITHOUT CONTRAST TECHNIQUE: Multidetector CT imaging of the abdomen and pelvis was performed following the standard protocol without IV contrast. COMPARISON:  None. FINDINGS: Lower chest: The visualized heart size within normal limits. No pericardial fluid/thickening. No hiatal hernia. The visualized portions of the lungs are clear. Hepatobiliary: Although limited due to the lack of intravenous contrast, normal in appearance without gross focal abnormality. No evidence of calcified gallstones or biliary ductal dilatation. Pancreas:  Unremarkable.  No surrounding inflammatory changes. Spleen: Normal in size. Although limited due to the lack of intravenous contrast, normal  in appearance. Adrenals/Urinary Tract: Both adrenal glands appear normal. Right-sided renal calculi are noted the largest within the midpole measures 1 cm. No right-sided hydronephrosis. No left-sided renal or collecting system calculi are seen. Abutting the superior surface of the bladder is the loculated pericolonic collection with mild wall thickening at the superior bladder which may be from the adjacent inflammatory changes. Stomach/Bowel: The stomach and proximal small bowel are unremarkable. Within the mid abdomen within a mid ileal loops there appears to be diffuse wall thickening and significant surrounding inflammatory changes. There is an adjacent air and fluid loculated collection measuring approximately 11.0 x 5.9 by 9.3 cm. There is also fat stranding changes seen around the mid sigmoid colon with diverticula. The remainder of the colon is unremarkable. Vascular/Lymphatic: There are no enlarged abdominal or pelvic lymph nodes. Scattered mild aortic atherosclerosis is seen. Reproductive: The prostate is unremarkable. Coarse calcifications are seen within the prostate. Other: A small amount of pneumoperitoneum seen along the anterior abdominal wall and under the diaphragms. Musculoskeletal: No acute or significant osseous findings. IMPRESSION: Findings suggestive of extensive mid ileal enteritis and possible sigmoid colonic diverticulitis. There is an adjacent multilocular fluid collection consistent with an intra-abdominal abscess measuring 11.0 x 5.9 x 9.3 cm. Small amount of pneumoperitoneum Nonobstructing right renal calculi Aortic Atherosclerosis (ICD10-I70.0). These results were called by telephone at the time of interpretation on 12/06/2019 at 9:42 pm to provider who verbally acknowledged these results. Electronically Signed   By: Prudencio Pair M.D.   On: 12/06/2019 21:43   DG Chest Portable 1 View  Result Date: 12/06/2019 CLINICAL DATA:  Hypotension. Lower abdominal pain and diarrhea.  Vomiting. EXAM: PORTABLE CHEST 1 VIEW COMPARISON:  Radiograph 07/17/2013. Included portions from lung basis of abdominal CT earlier today. FINDINGS: The cardiomediastinal contours are normal. Upper normal heart size. Mild elevation of right hemidiaphragm pulmonary vasculature is normal. No consolidation, pleural effusion, or pneumothorax. No acute osseous abnormalities are seen. Free air in the abdomen on concurrent abdominal CT is not well demonstrated by radiograph. IMPRESSION: 1. No acute chest findings. 2. Mild chronic elevation of right hemidiaphragm. Electronically Signed   By:  Keith Rake M.D.   On: 12/06/2019 21:46    Review of Systems  Constitutional: Positive for malaise/fatigue. Negative for chills and fever.  HENT: Negative for hearing loss.   Eyes: Negative for blurred vision and double vision.  Respiratory: Negative for cough and hemoptysis.   Cardiovascular: Negative for chest pain and palpitations.  Gastrointestinal: Positive for abdominal pain and nausea. Negative for vomiting.  Genitourinary: Negative for dysuria and urgency.  Musculoskeletal: Negative for myalgias and neck pain.  Skin: Negative for itching and rash.  Neurological: Negative for dizziness, tingling and headaches.  Endo/Heme/Allergies: Does not bruise/bleed easily.  Psychiatric/Behavioral: Negative for depression and suicidal ideas.    PE Blood pressure (!) 97/45, pulse 88, temperature 97.9 F (36.6 C), temperature source Oral, resp. rate 19, height 5\' 4"  (1.626 m), weight (!) 152 kg, SpO2 96 %. Constitutional: NAD; conversant; no deformities Eyes: Moist conjunctiva; no lid lag; anicteric; PERRL Neck: Trachea midline; no thyromegaly Lungs: Normal respiratory effort; no tactile fremitus CV: RRR; no palpable thrills; no pitting edema GI: Abd soft, tender in the lower mid abdomen with guarding; no palpable hepatosplenomegaly MSK: unable to assess gait; no clubbing/cyanosis Psychiatric: Appropriate  affect; alert and oriented x3 Lymphatic: No palpable cervical or axillary lymphadenopathy   Assessment/Plan: 69 yo female with diverticulitis with large abscess leading to severe sepsis. She has received 1 l bolus so far. Last BP on monitor is 107/67. This is a severe infection in person with multiple serious underlying comorbidities (CHF with untreated OSA, NASH associated with severe obesity, AKI). -IV abx -additional liter bolus -high rate of IV fluids -abscess appears organized and BP improved with small bolus. Will discuss drainage option with IR. If not amenable for IR drainage will need some procedure given size of abscess.   AKI with hyperkalemia -expect to improve with fluid resuscitation  CHF - last EF estimate was 50-55, concern for untreated OSA. BP in 2019 was 155/95 so do not think BP is low related to CHF but rather sepsis and dehydration  Arta Bruce Ertha Nabor 12/06/2019, 10:39 PM

## 2019-12-07 ENCOUNTER — Inpatient Hospital Stay (HOSPITAL_COMMUNITY): Payer: Medicare HMO

## 2019-12-07 DIAGNOSIS — E8809 Other disorders of plasma-protein metabolism, not elsewhere classified: Secondary | ICD-10-CM | POA: Diagnosis present

## 2019-12-07 DIAGNOSIS — I5022 Chronic systolic (congestive) heart failure: Secondary | ICD-10-CM

## 2019-12-07 DIAGNOSIS — A419 Sepsis, unspecified organism: Secondary | ICD-10-CM

## 2019-12-07 DIAGNOSIS — K651 Peritoneal abscess: Secondary | ICD-10-CM

## 2019-12-07 DIAGNOSIS — R1084 Generalized abdominal pain: Secondary | ICD-10-CM | POA: Diagnosis not present

## 2019-12-07 DIAGNOSIS — N179 Acute kidney failure, unspecified: Secondary | ICD-10-CM | POA: Diagnosis not present

## 2019-12-07 DIAGNOSIS — I2729 Other secondary pulmonary hypertension: Secondary | ICD-10-CM | POA: Diagnosis present

## 2019-12-07 DIAGNOSIS — Z885 Allergy status to narcotic agent status: Secondary | ICD-10-CM | POA: Diagnosis not present

## 2019-12-07 DIAGNOSIS — G4733 Obstructive sleep apnea (adult) (pediatric): Secondary | ICD-10-CM | POA: Diagnosis present

## 2019-12-07 DIAGNOSIS — D6489 Other specified anemias: Secondary | ICD-10-CM | POA: Diagnosis present

## 2019-12-07 DIAGNOSIS — Z8249 Family history of ischemic heart disease and other diseases of the circulatory system: Secondary | ICD-10-CM | POA: Diagnosis not present

## 2019-12-07 DIAGNOSIS — Z20822 Contact with and (suspected) exposure to covid-19: Secondary | ICD-10-CM | POA: Diagnosis present

## 2019-12-07 DIAGNOSIS — D7589 Other specified diseases of blood and blood-forming organs: Secondary | ICD-10-CM | POA: Diagnosis present

## 2019-12-07 DIAGNOSIS — K659 Peritonitis, unspecified: Secondary | ICD-10-CM | POA: Diagnosis not present

## 2019-12-07 DIAGNOSIS — I493 Ventricular premature depolarization: Secondary | ICD-10-CM | POA: Diagnosis not present

## 2019-12-07 DIAGNOSIS — R6521 Severe sepsis with septic shock: Secondary | ICD-10-CM | POA: Diagnosis present

## 2019-12-07 DIAGNOSIS — I5042 Chronic combined systolic (congestive) and diastolic (congestive) heart failure: Secondary | ICD-10-CM | POA: Diagnosis present

## 2019-12-07 DIAGNOSIS — K5720 Diverticulitis of large intestine with perforation and abscess without bleeding: Secondary | ICD-10-CM | POA: Diagnosis not present

## 2019-12-07 DIAGNOSIS — Z888 Allergy status to other drugs, medicaments and biological substances status: Secondary | ICD-10-CM | POA: Diagnosis not present

## 2019-12-07 DIAGNOSIS — Z6841 Body Mass Index (BMI) 40.0 and over, adult: Secondary | ICD-10-CM | POA: Diagnosis not present

## 2019-12-07 DIAGNOSIS — I428 Other cardiomyopathies: Secondary | ICD-10-CM | POA: Diagnosis present

## 2019-12-07 DIAGNOSIS — I130 Hypertensive heart and chronic kidney disease with heart failure and stage 1 through stage 4 chronic kidney disease, or unspecified chronic kidney disease: Secondary | ICD-10-CM | POA: Diagnosis present

## 2019-12-07 DIAGNOSIS — K7581 Nonalcoholic steatohepatitis (NASH): Secondary | ICD-10-CM | POA: Diagnosis present

## 2019-12-07 DIAGNOSIS — E860 Dehydration: Secondary | ICD-10-CM | POA: Diagnosis present

## 2019-12-07 DIAGNOSIS — I10 Essential (primary) hypertension: Secondary | ICD-10-CM | POA: Diagnosis not present

## 2019-12-07 DIAGNOSIS — N1831 Chronic kidney disease, stage 3a: Secondary | ICD-10-CM | POA: Diagnosis present

## 2019-12-07 DIAGNOSIS — Z452 Encounter for adjustment and management of vascular access device: Secondary | ICD-10-CM | POA: Diagnosis not present

## 2019-12-07 DIAGNOSIS — A4151 Sepsis due to Escherichia coli [E. coli]: Secondary | ICD-10-CM | POA: Diagnosis present

## 2019-12-07 DIAGNOSIS — N170 Acute kidney failure with tubular necrosis: Secondary | ICD-10-CM | POA: Diagnosis present

## 2019-12-07 DIAGNOSIS — E875 Hyperkalemia: Secondary | ICD-10-CM | POA: Diagnosis present

## 2019-12-07 DIAGNOSIS — E872 Acidosis: Secondary | ICD-10-CM | POA: Diagnosis present

## 2019-12-07 LAB — URINALYSIS, ROUTINE W REFLEX MICROSCOPIC
Bilirubin Urine: NEGATIVE
Glucose, UA: NEGATIVE mg/dL
Ketones, ur: NEGATIVE mg/dL
Leukocytes,Ua: NEGATIVE
Nitrite: NEGATIVE
Protein, ur: NEGATIVE mg/dL
Specific Gravity, Urine: 1.015 (ref 1.005–1.030)
pH: 5 (ref 5.0–8.0)

## 2019-12-07 LAB — BASIC METABOLIC PANEL
Anion gap: 15 (ref 5–15)
Anion gap: 12 (ref 5–15)
BUN: 102 mg/dL — ABNORMAL HIGH (ref 8–23)
BUN: 93 mg/dL — ABNORMAL HIGH (ref 8–23)
CO2: 19 mmol/L — ABNORMAL LOW (ref 22–32)
CO2: 22 mmol/L (ref 22–32)
Calcium: 10.7 mg/dL — ABNORMAL HIGH (ref 8.9–10.3)
Calcium: 10.3 mg/dL (ref 8.9–10.3)
Chloride: 99 mmol/L (ref 98–111)
Chloride: 102 mmol/L (ref 98–111)
Creatinine, Ser: 5.26 mg/dL — ABNORMAL HIGH (ref 0.44–1.00)
Creatinine, Ser: 4.74 mg/dL — ABNORMAL HIGH (ref 0.44–1.00)
GFR, Estimated: 8 mL/min — ABNORMAL LOW (ref >60)
GFR, Estimated: 10 mL/min — ABNORMAL LOW (ref >60)
Glucose, Bld: 112 mg/dL — ABNORMAL HIGH (ref 70–99)
Glucose, Bld: 132 mg/dL — ABNORMAL HIGH (ref 70–99)
Potassium: 5.2 mmol/L — ABNORMAL HIGH (ref 3.5–5.1)
Potassium: 4.9 mmol/L (ref 3.5–5.1)
Sodium: 133 mmol/L — ABNORMAL LOW (ref 135–145)
Sodium: 136 mmol/L (ref 135–145)

## 2019-12-07 LAB — CBC
HCT: 32.3 % — ABNORMAL LOW (ref 36.0–46.0)
Hemoglobin: 10.1 g/dL — ABNORMAL LOW (ref 12.0–15.0)
MCH: 32.2 pg (ref 26.0–34.0)
MCHC: 31.3 g/dL (ref 30.0–36.0)
MCV: 102.9 fL — ABNORMAL HIGH (ref 80.0–100.0)
Platelets: 356 10*3/uL (ref 150–400)
RBC: 3.14 MIL/uL — ABNORMAL LOW (ref 3.87–5.11)
RDW: 13.2 % (ref 11.5–15.5)
WBC: 27.9 10*3/uL — ABNORMAL HIGH (ref 4.0–10.5)
nRBC: 0 % (ref 0.0–0.2)

## 2019-12-07 LAB — HIV ANTIBODY (ROUTINE TESTING W REFLEX): HIV Screen 4th Generation wRfx: NONREACTIVE

## 2019-12-07 LAB — MAGNESIUM
Magnesium: 1.9 mg/dL (ref 1.7–2.4)
Magnesium: 1.9 mg/dL (ref 1.7–2.4)

## 2019-12-07 LAB — ECHOCARDIOGRAM COMPLETE
Area-P 1/2: 3.27 cm2
Height: 64 in
S' Lateral: 3.35 cm
Weight: 4557.35 oz

## 2019-12-07 LAB — GLUCOSE, CAPILLARY: Glucose-Capillary: 108 mg/dL — ABNORMAL HIGH (ref 70–99)

## 2019-12-07 LAB — PHOSPHORUS
Phosphorus: 3.9 mg/dL (ref 2.5–4.6)
Phosphorus: 4.5 mg/dL (ref 2.5–4.6)

## 2019-12-07 LAB — LACTIC ACID, PLASMA: Lactic Acid, Venous: 1.4 mmol/L (ref 0.5–1.9)

## 2019-12-07 LAB — FOLATE: Folate: 10.1 ng/mL (ref >5.9)

## 2019-12-07 LAB — PROTIME-INR
INR: 1.2 (ref 0.8–1.2)
Prothrombin Time: 15.2 seconds (ref 11.4–15.2)

## 2019-12-07 LAB — VITAMIN B12: Vitamin B-12: 388 pg/mL (ref 180–914)

## 2019-12-07 MED ORDER — FLUCONAZOLE IN SODIUM CHLORIDE 200-0.9 MG/100ML-% IV SOLN
200.0000 mg | INTRAVENOUS | Status: DC
  Administered 2019-12-07 – 2019-12-08 (×2): 200 mg via INTRAVENOUS
  Filled 2019-12-07 (×3): qty 100

## 2019-12-07 MED ORDER — SODIUM CHLORIDE 0.9 % IV BOLUS: 1000.0000 mL | Freq: Once | INTRAVENOUS | Status: DC

## 2019-12-07 MED ORDER — HEPARIN SODIUM (PORCINE) 5000 UNIT/ML IJ SOLN: 5000.0000 [IU] | Freq: Three times a day (TID) | INTRAMUSCULAR | Status: DC

## 2019-12-07 MED ORDER — SODIUM CHLORIDE 0.9% FLUSH
5.0000 mL | Freq: Three times a day (TID) | INTRAVENOUS | Status: DC
  Administered 2019-12-07 – 2019-12-17 (×25): 5 mL

## 2019-12-07 MED ORDER — METRONIDAZOLE IN NACL 5-0.79 MG/ML-% IV SOLN: 500.0000 mg | Freq: Three times a day (TID) | INTRAVENOUS | Status: DC

## 2019-12-07 MED ORDER — VASOPRESSIN 20 UNITS/100 ML INFUSION FOR SHOCK
0.0000 [IU]/min | INTRAVENOUS | Status: DC
  Administered 2019-12-07 – 2019-12-09 (×4): 0.03 [IU]/min via INTRAVENOUS
  Filled 2019-12-07 (×4): qty 100

## 2019-12-07 MED ORDER — ORAL CARE MOUTH RINSE
15.0000 mL | Freq: Two times a day (BID) | OROMUCOSAL | Status: DC
  Administered 2019-12-07 – 2019-12-13 (×12): 15 mL via OROMUCOSAL

## 2019-12-07 MED ORDER — NOREPINEPHRINE 4 MG/250ML-% IV SOLN
0.0000 ug/min | INTRAVENOUS | Status: DC
  Administered 2019-12-07: 20 ug/min via INTRAVENOUS
  Administered 2019-12-07: 18 ug/min via INTRAVENOUS
  Administered 2019-12-07: 4 ug/min via INTRAVENOUS
  Administered 2019-12-08: 18 ug/min via INTRAVENOUS
  Administered 2019-12-08: 13 ug/min via INTRAVENOUS
  Administered 2019-12-08: 10 ug/min via INTRAVENOUS
  Administered 2019-12-08: 16 ug/min via INTRAVENOUS
  Administered 2019-12-09: 10 ug/min via INTRAVENOUS
  Administered 2019-12-09: 2 ug/min via INTRAVENOUS
  Filled 2019-12-07 (×9): qty 250

## 2019-12-07 MED ORDER — PIPERACILLIN-TAZOBACTAM IN DEX 2-0.25 GM/50ML IV SOLN
2.2500 g | Freq: Three times a day (TID) | INTRAVENOUS | Status: DC
  Administered 2019-12-07 – 2019-12-11 (×13): 2.25 g via INTRAVENOUS
  Filled 2019-12-07 (×14): qty 50

## 2019-12-07 MED ORDER — DOCUSATE SODIUM 100 MG PO CAPS: 100.0000 mg | ORAL_CAPSULE | Freq: Two times a day (BID) | ORAL | Status: DC | PRN

## 2019-12-07 MED ORDER — HYDROMORPHONE HCL 1 MG/ML IJ SOLN
0.5000 mg | INTRAMUSCULAR | Status: DC | PRN
  Administered 2019-12-08: 0.5 mg via INTRAVENOUS
  Filled 2019-12-07: qty 0.5

## 2019-12-07 MED ORDER — ONDANSETRON HCL 4 MG/2ML IJ SOLN
INTRAMUSCULAR | Status: AC
  Filled 2019-12-07: qty 2

## 2019-12-07 MED ORDER — MIDAZOLAM HCL 2 MG/2ML IJ SOLN
INTRAMUSCULAR | Status: AC | PRN
  Administered 2019-12-07 (×2): 0.5 mg via INTRAVENOUS

## 2019-12-07 MED ORDER — HYDROCORTISONE NA SUCCINATE PF 100 MG IJ SOLR
50.0000 mg | Freq: Four times a day (QID) | INTRAMUSCULAR | Status: DC
  Administered 2019-12-07 – 2019-12-08 (×3): 50 mg via INTRAVENOUS
  Filled 2019-12-07 (×3): qty 2

## 2019-12-07 MED ORDER — PERFLUTREN LIPID MICROSPHERE
1.0000 mL | INTRAVENOUS | Status: AC | PRN
  Administered 2019-12-07: 2 mL via INTRAVENOUS
  Filled 2019-12-07: qty 10

## 2019-12-07 MED ORDER — MIDAZOLAM HCL 2 MG/2ML IJ SOLN
INTRAMUSCULAR | Status: AC
  Filled 2019-12-07: qty 4

## 2019-12-07 MED ORDER — PHENYLEPHRINE HCL-NACL 10-0.9 MG/250ML-% IV SOLN
25.0000 ug/min | INTRAVENOUS | Status: DC
  Administered 2019-12-07: 80 ug/min via INTRAVENOUS
  Administered 2019-12-07: 75 ug/min via INTRAVENOUS
  Administered 2019-12-07: 25 ug/min via INTRAVENOUS
  Filled 2019-12-07 (×3): qty 250

## 2019-12-07 MED ORDER — SODIUM CHLORIDE 0.9 % IV SOLN
250.0000 mL | INTRAVENOUS | Status: DC
  Administered 2019-12-07 – 2019-12-11 (×2): 250 mL via INTRAVENOUS

## 2019-12-07 MED ORDER — CHLORHEXIDINE GLUCONATE CLOTH 2 % EX PADS
6.0000 | MEDICATED_PAD | Freq: Every day | CUTANEOUS | Status: DC
  Administered 2019-12-08 – 2019-12-17 (×6): 6 via TOPICAL

## 2019-12-07 MED ORDER — FENTANYL CITRATE (PF) 100 MCG/2ML IJ SOLN
INTRAMUSCULAR | Status: AC | PRN
Start: 2019-12-07 — End: 2019-12-07
  Administered 2019-12-07 (×2): 25 ug via INTRAVENOUS

## 2019-12-07 MED ORDER — FENTANYL CITRATE (PF) 100 MCG/2ML IJ SOLN
INTRAMUSCULAR | Status: AC
  Filled 2019-12-07: qty 4

## 2019-12-07 MED ORDER — POLYETHYLENE GLYCOL 3350 17 G PO PACK: 17.0000 g | PACK | Freq: Every day | ORAL | Status: DC | PRN

## 2019-12-07 NOTE — Consult Note (Addendum)
[place] Nurse Consult Note: Patient receiving care in [facility] 2M05 Reason for Consult: MASD breast, groin Wound type: MASD/ITD Partial thickness with maceration Pressure Injury POA: NA Wound bed: Red, moist, painful Drainage (amount, consistency, odor) None  Dressing procedure/placement/frequency: Apply InterDry under breast and in groin areas. Measure and cut length of Phil Dopp Kellie Simmering # [phone]) to fit in skin folds that have skin breakdown  Tuck InterDry fabric into skin folds in a single layer, allow for 2 inches of overhang from skin edges to allow for wicking to occur May remove to bathe; dry area thoroughly and then tuck into affected areas again  Do not apply any creams or ointments when using InterDry DO NOT THROW AWAY FOR 5 DAYS unless soiled with stool DO NOT [facility] product, this will inactivate the silver in the material  New sheet of Interdry should be applied after 5 days of use if patient continues to have skin breakdown  Monitor the wound area(s) for worsening of condition such as: Signs/symptoms of infection, increase in size, development of or worsening of odor, development of pain, or increased pain at the affected locations.   Notify the medical team if any of these develop.  Thank you for the consult. [place] nurse will not follow at this time.   Please re-consult the [place] team if needed.  Cathlean Marseilles Tamala Julian, MSN, RN, [place], [place]le [place], [facility] Wound Treatment Associate Pager [phone]

## 2019-12-07 NOTE — Progress Notes (Signed)
  Echocardiogram 2D Echocardiogram has been performed.  Randa Lynn Lorenia Hoston 12/07/2019, 12:03 PM

## 2019-12-07 NOTE — Consult Note (Signed)
Chief Complaint: Patient was seen in consultation today for intra abdominal abscess drain placement Chief Complaint  Patient presents with  . Abdominal Pain    Hypotension   at the request of Dr Sherrill Raring   Supervising Physician: Aletta Edouard  Patient Status: [name] - In-pt  History of Present Illness: Phyllis Todd is a 69 y.o. female   Hx NASH; CHF Obesity; CKD Abdominal pain x 1 week Worsening and presented to ED CT yesterday: IMPRESSION: Findings suggestive of extensive mid ileal enteritis and possible sigmoid colonic diverticulitis. There is an adjacent multilocular fluid collection consistent with an intra-abdominal abscess measuring 11.0 x 5.9 x 9.3 cm.  Hypotensive in ED; sepsis Still on pressors now BP now  100-110s/40-50s  Request for IR drain placement Dr Kathlene Cote has reviewed imaging and approves procedure (will need to verify with RN- pt is stable to come to IR)   Past Medical History:  Diagnosis Date  . Acute combined systolic and diastolic heart failure ([place]) 06/2013   Class 3 - ECHO EF 35-40%  . Acute renal failure ([place])   . BMI 60.0-69.9, adult ([place])   . CHF (congestive heart failure) ([place]) 06/2013  . Hypertension   . Morbid obesity ([place])   . NICM (nonischemic cardiomyopathy) ([place]) 06/2013  . Obstructive sleep apnea (adult) (pediatric) 06/2013  . Other ascites 06/2013  . Other chronic nonalcoholic liver disease 10/6281    Past Surgical History:  Procedure Laterality Date  . CARDIAC CATHETERIZATION  2015  . CESAREAN SECTION    . DILATION AND CURETTAGE OF UTERUS  1984  . LEFT HEART CATHETERIZATION WITH CORONARY ANGIOGRAM N/A 07/12/2013   Procedure: LEFT HEART CATHETERIZATION WITH CORONARY ANGIOGRAM;  Surgeon: Troy Sine, MD;  Location: [facility] CATH LAB;  Service: Cardiovascular;  Laterality: N/A;  . TONSILLECTOMY      Allergies: Codeine, Demerol [meperidine], Lisinopril, and Peanut-containing drug products  Medications: Prior to Admission  medications   Medication Sig Start Date End Date Taking? Authorizing Provider  acetaminophen (TYLENOL) 500 MG tablet Take 500 mg by mouth every 6 (six) hours as needed (pain).   Yes [provider]  aspirin EC 81 MG EC tablet Take 1 tablet (81 mg total) by mouth daily. 07/13/13  Yes Charlynne Cousins, MD  carvedilol (COREG) 25 MG tablet TAKE (1) TABLET TWICE A DAY WITH FOOD---BREAKFAST AND SUPPER. Patient taking differently: Take 25 mg by mouth 2 (two) times daily with a meal.  02/24/19  Yes Larey Dresser, MD  furosemide (LASIX) 40 MG tablet Take 1 tablet (40 mg total) by mouth daily. Must have office visit please call [phone] 10/25/19  Yes Larey Dresser, MD  losartan (COZAAR) 50 MG tablet TAKE 1 TABLET BY MOUTH TWICE DAILY 11/30/19  Yes Larey Dresser, MD  spironolactone (ALDACTONE) 25 MG tablet TAKE 1 TABLET BY MOUTH ONCE DAILY. 11/30/19  Yes Larey Dresser, MD     Family History  Problem Relation Age of Onset  . Hypertension Mother   . Heart failure Mother   . Stroke Mother   . CAD Father   . Hypertension Father   . Stroke Brother 45  . Diabetes Brother   . Hypertension Brother   . Breast cancer Maternal Grandmother     Social History   Socioeconomic History  . Marital status: Divorced    Spouse name: Not on file  . Number of children: 1  . Years of education: 49  . Highest education level: Not on file  Occupational History  . 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 Smoking status: Never Smoker  . Smokeless tobacco: Never Used  Substance and Sexual Activity  . Alcohol use: Yes    Comment: occas  . Drug use: No  . Sexual activity: Not on file  Other Topics Concern  . Not on file  Social History Narrative   Phyllis Todd grew up in [place], [place]. She attended [employer] and obtained her Bachelor's in Literature. She is divorced and has 1 daughter Phyllis Todd). Hobbies: crochet and gardening.   Social Determinants of Health    Financial Resource Strain:   . Difficulty of Paying Living Expenses: Not on file  Food Insecurity:   . Worried About Charity fundraiser in the Last Year: Not on file  . Ran Out of Food in the Last Year: Not on file  Transportation Needs:   . Lack of Transportation (Medical): Not on file  . Lack of Transportation (Non-Medical): Not on file  Physical Activity:   . Days of Exercise per Week: Not on file  . Minutes of Exercise per Session: Not on file  Stress:   . Feeling of Stress : Not on file  Social Connections:   . Frequency of Communication with Friends and Family: Not on file  . Frequency of Social Gatherings with Friends and Family: Not on file  . Attends Religious Services: Not on file  . Active Member of Clubs or Organizations: Not on file  . Attends Archivist Meetings: Not on file  . Marital Status: Not on file    Review of Systems: A 12 point ROS discussed and pertinent positives are indicated in the HPI above.  All other systems are negative.  Review of Systems  Constitutional: Positive for diaphoresis and fever.  Gastrointestinal: Positive for abdominal pain and nausea.  Psychiatric/Behavioral: Negative for behavioral problems and confusion.    Vital Signs: BP (!) 101/48   Pulse 78   Temp 99 F (37.2 C)   Resp (!) 23   Ht 5\' 4"  (1.626 m)   Wt 284 lb 13.4 oz (129.2 kg)   SpO2 99%   BMI 48.89 kg/m   Physical Exam Vitals reviewed.  Cardiovascular:     Rate and Rhythm: Normal rate and regular rhythm.  Pulmonary:     Breath sounds: Normal breath sounds.  Abdominal:     Tenderness: There is abdominal tenderness in the right lower quadrant and left lower quadrant.  Skin:    General: Skin is warm.  Neurological:     Mental Status: She is alert and oriented to person, place, and time.  Psychiatric:        Behavior: Behavior normal.     Imaging: CT ABDOMEN PELVIS WO CONTRAST  Result Date: 12/06/2019 CLINICAL DATA:  Right lower quadrant  abdominal pain EXAM: CT ABDOMEN AND PELVIS WITHOUT CONTRAST TECHNIQUE: Multidetector CT imaging of the abdomen and pelvis was performed following the standard protocol without IV contrast. COMPARISON:  None. FINDINGS: Lower chest: The visualized heart size within normal limits. No pericardial fluid/thickening. No hiatal hernia. The visualized portions of the lungs are clear. Hepatobiliary: Although limited due to the lack of intravenous contrast, normal in appearance without gross focal abnormality. No evidence of calcified gallstones or biliary ductal dilatation. Pancreas:  Unremarkable.  No surrounding inflammatory changes. Spleen: Normal in size. Although limited due to the lack of intravenous contrast, normal in appearance. Adrenals/Urinary Tract: Both adrenal glands appear normal.  Right-sided renal calculi are noted the largest within the midpole measures 1 cm. No right-sided hydronephrosis. No left-sided renal or collecting system calculi are seen. Abutting the superior surface of the bladder is the loculated pericolonic collection with mild wall thickening at the superior bladder which may be from the adjacent inflammatory changes. Stomach/Bowel: The stomach and proximal small bowel are unremarkable. Within the mid abdomen within a mid ileal loops there appears to be diffuse wall thickening and significant surrounding inflammatory changes. There is an adjacent air and fluid loculated collection measuring approximately 11.0 x 5.9 by 9.3 cm. There is also fat stranding changes seen around the mid sigmoid colon with diverticula. The remainder of the colon is unremarkable. Vascular/Lymphatic: There are no enlarged abdominal or pelvic lymph nodes. Scattered mild aortic atherosclerosis is seen. Reproductive: The prostate is unremarkable. Coarse calcifications are seen within the prostate. Other: A small amount of pneumoperitoneum seen along the anterior abdominal wall and under the diaphragms. Musculoskeletal: No  acute or significant osseous findings. IMPRESSION: Findings suggestive of extensive mid ileal enteritis and possible sigmoid colonic diverticulitis. There is an adjacent multilocular fluid collection consistent with an intra-abdominal abscess measuring 11.0 x 5.9 x 9.3 cm. Small amount of pneumoperitoneum Nonobstructing right renal calculi Aortic Atherosclerosis (ICD10-I70.0). These results were called by telephone at the time of interpretation on 12/06/2019 at 9:42 pm to provider who verbally acknowledged these results. Electronically Signed   By: Prudencio Pair M.D.   On: 12/06/2019 21:43   DG Chest Portable 1 View  Result Date: 12/06/2019 CLINICAL DATA:  Hypotension. Lower abdominal pain and diarrhea. Vomiting. EXAM: PORTABLE CHEST 1 VIEW COMPARISON:  Radiograph 07/17/2013. Included portions from lung basis of abdominal CT earlier today. FINDINGS: The cardiomediastinal contours are normal. Upper normal heart size. Mild elevation of right hemidiaphragm pulmonary vasculature is normal. No consolidation, pleural effusion, or pneumothorax. No acute osseous abnormalities are seen. Free air in the abdomen on concurrent abdominal CT is not well demonstrated by radiograph. IMPRESSION: 1. No acute chest findings. 2. Mild chronic elevation of right hemidiaphragm. Electronically Signed   By: Keith Rake M.D.   On: 12/06/2019 21:46    Labs:  CBC: Recent Labs    12/06/19 1944 12/07/19 0239  WBC 31.5* 27.9*  HGB 10.6* 10.1*  HCT 34.4* 32.3*  PLT 439* 356    COAGS: No results for input(s): INR, APTT in the last 8760 hours.  BMP: Recent Labs    12/06/19 1944 12/07/19 0239  NA 134* 133*  K 5.5* 5.2*  CL 94* 99  CO2 22 19*  GLUCOSE 126* 112*  BUN 105* 102*  CALCIUM 11.5* 10.7*  CREATININE 5.75* 5.26*  GFRNONAA 8* 8*    LIVER FUNCTION TESTS: Recent Labs    12/06/19 1944  BILITOT 1.2  AST 13*  ALT 11  ALKPHOS 112  PROT 7.3  ALBUMIN 2.3*    TUMOR MARKERS: No results for  input(s): AFPTM, CEA, CA199, CHROMGRNA in the last 8760 hours.  Assessment and Plan:  Intra abdominal abscess Sepsis Leukocytosis Scheduled for abscess drain placement in IR today Risks and benefits discussed with the patient including bleeding, infection, damage to adjacent structures, bowel perforation/fistula connection, and sepsis.  All of the patient's questions were answered, patient is agreeable to proceed. Consent signed and in chart.   Thank you for this interesting consult.  I greatly enjoyed meeting [facility] and look forward to participating in their care.  A copy of this report was sent to the requesting provider on  this date.  Electronically Signed: Lavonia Drafts, PA-C 12/07/2019, 8:54 AM   I spent a total of 40 Minutes    in face to face in clinical consultation, greater than 50% of which was counseling/coordinating care for intra abdominal abscess drain placement

## 2019-12-07 NOTE — H&P (Signed)
NAME:  Phyllis Todd, MRN:  [id number], DOB:  08-May-1950, LOS: 0 ADMISSION DATE:  12/06/2019, CONSULTATION DATE: 12/07/2019 REFERRING MD: Ottis Stain, CHIEF COMPLAINT: Left lower quadrant abdominal pain  Brief History   69 year old with 1 week history of left lower quadrant abdominal pain  History of present illness   Patient is a 69 year old female, morbidly obese BMI of 57 presents to the emergency room with a 1 week history of left lower quadrant abdominal pain.  With this she has associated anorexia with poor oral intake of solids and fluids, last bowel movement was 3 days ago.  She has a prior history of systolic and diastolic heart failure with an ejection fraction of 35 to 40% although on reevaluation October 2019 her ejection fraction was up to 55 to 60%.  She also has a history of [place]. Patient has been hypotensive in the emergency room currently receiving her third liter of IV fluid.  Blood pressure is running about 100/60.  Blood cultures have been planted.  CT scan of the abdomen showsintra-abdominal abscess measuring 11.0 x 5.9 x 9.3 cm.  Labs are pertinent for a white count of 31,000 hemoglobin of 10 platelet count 439.  Creatinine is 5.75 with a baseline of 1.6.  Anion gap is 18.  Lactic acid is pending. Surgery is considering adequate drainage of the abscess versus IR placing a drain.  Past Medical History   . Acute combined systolic and diastolic heart failure ([place]) 06/2013   Class 3 - ECHO EF 35-40%  . Acute renal failure ([place])   . BMI 60.0-69.9, adult ([place])   . CHF (congestive heart failure) ([place]) 06/2013  . Hypertension   . Morbid obesity ([place])   . NICM (nonischemic cardiomyopathy) ([place]) 06/2013  . Obstructive sleep apnea (adult) (pediatric) 06/2013  . Other ascites 06/2013  . Other chronic nonalcoholic liver disease 05/4032     Significant Hospital Events   NA  Consults:  Surgery  Procedures:  NA  Significant Diagnostic Tests:  As above  Micro Data:  Blood  cultures pending  Antimicrobials:  Started on cefepime Interim history/subjective:  NA  Objective   Blood pressure (!) 103/54, pulse 88, temperature 97.6 F (36.4 C), resp. rate (!) 23, height 5\' 4"  (1.626 m), weight (!) 152 kg, SpO2 93 %.       No intake or output data in the 24 hours ending 12/07/19 0101 Filed Weights   12/06/19 1919  Weight: (!) 152 kg    Examination: General: Obese white female in no acute distress HENT: Normal Lungs: Clear Cardiovascular: Regular rate and rhythm no murmurs gallops Abdomen: Point tenderness in the left lower quadrant with no referred pain bowel sounds are absent Extremities: Chronic lymphedema little in the way of acute edema now Neuro: Awake alert nonfocal GU: N/A  Resolved Hospital Problem list   NA  Assessment & Plan:  1.  Sepsis: Patient currently is hypotensive but with poor p.o. intake elevated creatinine her hypotension most likely is a combination of dehydration and bacteremia.  We will continue to volume expand.  She may need pressors.  We have discussed the risks benefits of a central line and she is verbally consented to same.  We will treat with vancomycin Flagyl and Diflucan.  Await further surgical input.  2.  Acute kidney injury: We will continue to volume expand repeat labs in the morning  3.  History of CHF: We will repeat echocardiogram tomorrow.   4. Continue to monitor closely for pulmonary edema secondary  to congestive heart failure.  4.  History of pulmonary hypertension secondary to obstructive sleep apnea and chronic congestive heart failure.  Echocardiogram will allow reevaluation in the morning.  5.  Hypoalbuminemia secondary to poor p.o. intake and sepsis.  Will monitor.    Best practice:  Diet: N.p.o. Pain/Anxiety/Delirium protocol (if indicated): Pain medication as needed VAP protocol (if indicated): N/A DVT prophylaxis: We will hold anticoagulation for now.  Waiting IR surgical intervention. GI  prophylaxis: N/A Glucose control: Monitor Mobility: Bedrest Code Status: Full Family Communication: Discussed at length with patient Disposition: To ICU for further therapy and observation  Labs   CBC: Recent Labs  Lab 12/06/19 1944  WBC 31.5*  NEUTROABS 26.5*  HGB 10.6*  HCT 34.4*  MCV 104.9*  PLT 439*    Basic Metabolic Panel: Recent Labs  Lab 12/06/19 1944  NA 134*  K 5.5*  CL 94*  CO2 22  GLUCOSE 126*  BUN 105*  CREATININE 5.75*  CALCIUM 11.5*   GFR: Estimated Creatinine Clearance: 14 mL/min (A) (by C-G formula based on SCr of 5.75 mg/dL (H)). Recent Labs  Lab 12/06/19 1944  WBC 31.5*    Liver Function Tests: Recent Labs  Lab 12/06/19 1944  AST 13*  ALT 11  ALKPHOS 112  BILITOT 1.2  PROT 7.3  ALBUMIN 2.3*   Recent Labs  Lab 12/06/19 1944  LIPASE 28   No results for input(s): AMMONIA in the last 168 hours.  ABG    Component Value Date/Time   PHART 7.389 07/12/2013 1438   PCO2ART 50.6 (H) 07/12/2013 1438   PO2ART 51.0 (L) 07/12/2013 1438   HCO3 30.5 (H) 07/12/2013 1438   TCO2 32 07/12/2013 1438   O2SAT 85.0 07/12/2013 1438     Coagulation Profile: No results for input(s): INR, PROTIME in the last 168 hours.  Cardiac Enzymes: No results for input(s): CKTOTAL, CKMB, CKMBINDEX, TROPONINI in the last 168 hours.  HbA1C: No results found for: HGBA1C  CBG: No results for input(s): GLUCAP in the last 168 hours.  Review of Systems:   Other than nausea mild vomiting negative on 14 point review of systems other than that mentioned in HPI.  Past Medical History  She,  has a past medical history of Acute combined systolic and diastolic heart failure ([place]) (06/2013), Acute renal failure ([place]), BMI 60.0-69.9, adult ([place]), CHF (congestive heart failure) ([place]) (06/2013), Hypertension, Morbid obesity ([place]), NICM (nonischemic cardiomyopathy) ([place]) (06/2013), Obstructive sleep apnea (adult) (pediatric) (06/2013), Other ascites (06/2013), and Other  chronic nonalcoholic liver disease (02/8561).   Surgical History    Past Surgical History:  Procedure Laterality Date  . CARDIAC CATHETERIZATION  2015  . CESAREAN SECTION    . DILATION AND CURETTAGE OF UTERUS  1984  . LEFT HEART CATHETERIZATION WITH CORONARY ANGIOGRAM N/A 07/12/2013   Procedure: LEFT HEART CATHETERIZATION WITH CORONARY ANGIOGRAM;  Surgeon: Troy Sine, MD;  Location: [facility] CATH LAB;  Service: Cardiovascular;  Laterality: N/A;  . TONSILLECTOMY       Social History   reports that she has never smoked. She has never used smokeless tobacco. She reports current alcohol use. She reports that she does not use drugs.   Family History   Her family history includes Breast cancer in her maternal grandmother; CAD in her father; Diabetes in her brother; Heart failure in her mother; Hypertension in her brother, father, and mother; Stroke in her mother; Stroke (age of onset: 11) in her brother.   Allergies Allergies  Allergen Reactions  .  Codeine Other (See Comments)    "makes me hyper"  . Demerol [Meperidine] Other (See Comments)    "sick"  . Lisinopril Cough  . Peanut-Containing Drug Products Hives and Swelling    "almonds"     Home Medications  Prior to Admission medications   Medication Sig Start Date End Date Taking? Authorizing Provider  acetaminophen (TYLENOL) 500 MG tablet Take 500 mg by mouth every 6 (six) hours as needed (pain).   Yes [provider]  aspirin EC 81 MG EC tablet Take 1 tablet (81 mg total) by mouth daily. 07/13/13  Yes Charlynne Cousins, MD  carvedilol (COREG) 25 MG tablet TAKE (1) TABLET TWICE A DAY WITH FOOD---BREAKFAST AND SUPPER. Patient taking differently: Take 25 mg by mouth 2 (two) times daily with a meal.  02/24/19  Yes Larey Dresser, MD  furosemide (LASIX) 40 MG tablet Take 1 tablet (40 mg total) by mouth daily. Must have office visit please call [phone] 10/25/19  Yes Larey Dresser, MD  losartan (COZAAR) 50 MG tablet TAKE 1  TABLET BY MOUTH TWICE DAILY 11/30/19  Yes Larey Dresser, MD  spironolactone (ALDACTONE) 25 MG tablet TAKE 1 TABLET BY MOUTH ONCE DAILY. 11/30/19  Yes Larey Dresser, MD     Critical care time:Over 35 minutes spent in bedside evaluation, chart review and critical care planning

## 2019-12-07 NOTE — Progress Notes (Signed)
  Echocardiogram 2D Echocardiogram has been attempted. Patient receiving sterile procedure. Will reattempt at later time.  Randa Lynn Josselin Gaulin 12/07/2019, 10:34 AM

## 2019-12-07 NOTE — Progress Notes (Signed)
[facility] Progress Note     Subjective: CC:  Reports large volume bilious emesis overnight. describes intermittent sharp abdominal pain in the epigastrium and LLQ. Past abdominal surgeries include cesarean section x1.   Objective: Vital signs in last 24 hours: Temp:  [97.6 F (36.4 C)-99 F (37.2 C)] 99 F (37.2 C) (10/26 0800) Pulse Rate:  [71-92] 78 (10/26 0800) Resp:  [16-28] 23 (10/26 0800) BP: (61-117)/(28-59) 101/48 (10/26 0800) SpO2:  [89 %-99 %] 99 % (10/26 0800) Weight:  [129.2 kg-152 kg] 129.2 kg (10/26 0136) Last BM Date: 12/02/19  Intake/Output from previous day: 10/25 0701 - 10/26 0700 In: 100.2 [IV Piggyback:100.2] Out: 300 [Urine:300] Intake/Output this shift: No intake/output data recorded.  PE:  Gen:  Alert, NAD, non-toxic appearing female who appears states age Card:  Regular rate and rhythm, heart murmur present Pulm:  Normal effort on nasal cannula, clear to auscultation bilaterally Abd: Soft, obese, mild epigastric tenderness, not significantly tender, no peritonitis, no bowel sounds appreciated Skin: warm and dry, skin breakdown inframammary fold and groin Psych: A&Ox3   Lab Results:  Recent Labs    12/06/19 1944 12/07/19 0239  WBC 31.5* 27.9*  HGB 10.6* 10.1*  HCT 34.4* 32.3*  PLT 439* 356   BMET Recent Labs    12/06/19 1944 12/07/19 0239  NA 134* 133*  K 5.5* 5.2*  CL 94* 99  CO2 22 19*  GLUCOSE 126* 112*  BUN 105* 102*  CREATININE 5.75* 5.26*  CALCIUM 11.5* 10.7*   PT/INR No results for input(s): LABPROT, INR in the last 72 hours. CMP     Component Value Date/Time   NA 133 (L) 12/07/2019 0239   NA 140 01/14/2014 0921   K 5.2 (H) 12/07/2019 0239   CL 99 12/07/2019 0239   CO2 19 (L) 12/07/2019 0239   GLUCOSE 112 (H) 12/07/2019 0239   BUN 102 (H) 12/07/2019 0239   BUN 29 (H) 01/14/2014 0921   CREATININE 5.26 (H) 12/07/2019 0239   CALCIUM 10.7 (H) 12/07/2019 0239   PROT 7.3 12/06/2019 1944   ALBUMIN 2.3  (L) 12/06/2019 1944   AST 13 (L) 12/06/2019 1944   ALT 11 12/06/2019 1944   ALKPHOS 112 12/06/2019 1944   BILITOT 1.2 12/06/2019 1944   GFRNONAA 8 (L) 12/07/2019 0239   GFRAA 38 (L) 03/13/2018 1053   Lipase     Component Value Date/Time   LIPASE 28 12/06/2019 1944       Studies/Results: CT ABDOMEN PELVIS WO CONTRAST  Result Date: 12/06/2019 CLINICAL DATA:  Right lower quadrant abdominal pain EXAM: CT ABDOMEN AND PELVIS WITHOUT CONTRAST TECHNIQUE: Multidetector CT imaging of the abdomen and pelvis was performed following the standard protocol without IV contrast. COMPARISON:  None. FINDINGS: Lower chest: The visualized heart size within normal limits. No pericardial fluid/thickening. No hiatal hernia. The visualized portions of the lungs are clear. Hepatobiliary: Although limited due to the lack of intravenous contrast, normal in appearance without gross focal abnormality. No evidence of calcified gallstones or biliary ductal dilatation. Pancreas:  Unremarkable.  No surrounding inflammatory changes. Spleen: Normal in size. Although limited due to the lack of intravenous contrast, normal in appearance. Adrenals/Urinary Tract: Both adrenal glands appear normal. Right-sided renal calculi are noted the largest within the midpole measures 1 cm. No right-sided hydronephrosis. No left-sided renal or collecting system calculi are seen. Abutting the superior surface of the bladder is the loculated pericolonic collection with mild wall thickening at the superior bladder which may be from the  adjacent inflammatory changes. Stomach/Bowel: The stomach and proximal small bowel are unremarkable. Within the mid abdomen within a mid ileal loops there appears to be diffuse wall thickening and significant surrounding inflammatory changes. There is an adjacent air and fluid loculated collection measuring approximately 11.0 x 5.9 by 9.3 cm. There is also fat stranding changes seen around the mid sigmoid colon with  diverticula. The remainder of the colon is unremarkable. Vascular/Lymphatic: There are no enlarged abdominal or pelvic lymph nodes. Scattered mild aortic atherosclerosis is seen. Reproductive: The prostate is unremarkable. Coarse calcifications are seen within the prostate. Other: A small amount of pneumoperitoneum seen along the anterior abdominal wall and under the diaphragms. Musculoskeletal: No acute or significant osseous findings. IMPRESSION: Findings suggestive of extensive mid ileal enteritis and possible sigmoid colonic diverticulitis. There is an adjacent multilocular fluid collection consistent with an intra-abdominal abscess measuring 11.0 x 5.9 x 9.3 cm. Small amount of pneumoperitoneum Nonobstructing right renal calculi Aortic Atherosclerosis (ICD10-I70.0). These results were called by telephone at the time of interpretation on 12/06/2019 at 9:42 pm to provider who verbally acknowledged these results. Electronically Signed   By: Prudencio Pair M.D.   On: 12/06/2019 21:43   DG Chest Portable 1 View  Result Date: 12/06/2019 CLINICAL DATA:  Hypotension. Lower abdominal pain and diarrhea. Vomiting. EXAM: PORTABLE CHEST 1 VIEW COMPARISON:  Radiograph 07/17/2013. Included portions from lung basis of abdominal CT earlier today. FINDINGS: The cardiomediastinal contours are normal. Upper normal heart size. Mild elevation of right hemidiaphragm pulmonary vasculature is normal. No consolidation, pleural effusion, or pneumothorax. No acute osseous abnormalities are seen. Free air in the abdomen on concurrent abdominal CT is not well demonstrated by radiograph. IMPRESSION: 1. No acute chest findings. 2. Mild chronic elevation of right hemidiaphragm. Electronically Signed   By: Keith Rake M.D.   On: 12/06/2019 21:46    Anti-infectives: Anti-infectives (From admission, onward)   Start     Dose/Rate Route Frequency Ordered Stop   12/07/19 0600  metroNIDAZOLE (FLAGYL) IVPB 500 mg  Status:  Discontinued         500 mg 100 mL/hr over 60 Minutes Intravenous Every 8 hours 12/07/19 0030 12/07/19 0122   12/07/19 0600  piperacillin-tazobactam (ZOSYN) IVPB 2.25 g        2.25 g 100 mL/hr over 30 Minutes Intravenous Every 8 hours 12/07/19 0122     12/07/19 0130  fluconazole (DIFLUCAN) IVPB 200 mg        200 mg 100 mL/hr over 60 Minutes Intravenous Every 24 hours 12/07/19 0030     12/06/19 2200  ceFEPIme (MAXIPIME) 2 g in sodium chloride 0.9 % 100 mL IVPB        2 g 200 mL/hr over 30 Minutes Intravenous  Once 12/06/19 2146 12/06/19 2330   12/06/19 2200  metroNIDAZOLE (FLAGYL) IVPB 500 mg        500 mg 100 mL/hr over 60 Minutes Intravenous  Once 12/06/19 2146 12/06/19 2358     Assessment/Plan Morbid obesity HTN combind systolic and diastolic heart failure - last echo 2107 EF 55-60%; echo ordered this AM await final read Pulmonary HTN OSA NASH, non-alcoholic fatty liver disease  Acute renal failure with history of CKD3 - BUN 102, creatinine 5.26, per chart review baseline Cr around 1.6 Hyperkalemia 5.2  Septic shock due to below - per CCM, now on Neo to keep MAPs > 65 Acute sigmoid diverticulitis with perforation and 11.0 x 5.9 x 9.3 cm abscess - afebrile, HR 77 bpm during my  exam, BP 90's/40's during my exam, WBC 27.9 - recommend urgent consult to IR for percutaneous drainage in attempt to gain source conrtol. If not amenable to IR drainage or patient becomes too unstable with worsening septic shock she may require emergency surgery, likely ex lap, partial colectomy, colostomy. In the setting of multiple above medical comorbidities perioperative morbidity and mortality are high. According to NSQIP patient has a 34.8% likelihood of serious perioperative complication, 8.0% risk of cardiac complication, and 22.3% chance of perioperative mortality. I discussed the possibility of surgery with the patient if non-operative measures are unsuccessful or her condition worsens and she states if she was going  to die without an operation she would want to go to surgery.  - greatly appreciate CCM's care of this patient   FEN: NPO, IVF ID: Zosyn, fluconazole VTE: SCD's, SQH held for possible procedure Foley: in place for strict I&Os, 300 cc cloudy yellow urine Dispo: ICU   LOS: 0 days    Obie Dredge, [facility] Surgery Please see Amion for pager number during day hours 7:00am-4:30pm

## 2019-12-07 NOTE — Procedures (Addendum)
Directly supervised.  Central Venous Catheter Insertion Procedure Note  Phyllis Todd  [id number]  September 09, 1950  Date:12/07/19  Time:12:19 PM   Provider Performing:Iulia Charleen Kirks   Procedure: Insertion of Non-tunneled Central Venous [phone]) with US guidance ([id number])   Indication(s) Medication administration  Consent Risks of the procedure as well as the alternatives and risks of each were explained to the patient and/or caregiver.  Consent for the procedure was obtained and is signed in the bedside chart  Anesthesia Topical only with 1% lidocaine   Timeout Verified patient identification, verified procedure, site/side was marked, verified correct patient position, special equipment/implants available, medications/allergies/relevant history reviewed, required imaging and test results available.  Sterile Technique Maximal sterile technique including full sterile barrier drape, hand hygiene, sterile gown, sterile gloves, mask, hair covering, sterile ultrasound probe cover (if used).  Procedure Description Area of catheter insertion was cleaned with chlorhexidine and draped in sterile fashion.  With real-time ultrasound guidance a central venous catheter was placed into the right internal jugular vein. Nonpulsatile blood flow and easy flushing noted in all ports.  The catheter was sutured in place and sterile dressing applied.  Complications/Tolerance None; patient tolerated the procedure well. Chest X-ray is ordered to verify placement for internal jugular or subclavian cannulation.    EBL Minimal  Specimen(s) None  Dr. Jose Persia Internal Medicine PGY-2  12/07/2019, 12:20 PM

## 2019-12-07 NOTE — Progress Notes (Signed)
NAME:  Phyllis Todd, MRN:  [id number], DOB:  November 07, 1950, LOS: 0 ADMISSION DATE:  12/06/2019, CONSULTATION DATE: 12/07/2019 REFERRING MD: Ottis Stain, CHIEF COMPLAINT: Left lower quadrant abdominal pain  Brief History   69 year old with 1 week history of left lower quadrant abdominal pain  History of present illness   Patient is a 69 year old female, morbidly obese BMI of 57 presents to the emergency room with a 1 week history of left lower quadrant abdominal pain.  With this she has associated anorexia with poor oral intake of solids and fluids, last bowel movement was 3 days ago.  She has a prior history of systolic and diastolic heart failure with an ejection fraction of 35 to 40% although on reevaluation October 2019 her ejection fraction was up to 55 to 60%.  She also has a history of [place]. Patient has been hypotensive in the emergency room currently receiving her third liter of IV fluid.  Blood pressure is running about 100/60.  Blood cultures have been planted.  CT scan of the abdomen shows intra-abdominal abscess measuring 11.0 x 5.9 x 9.3 cm.  Labs are pertinent for a white count of 31,000, hemoglobin of 10, platelet count 439.  Creatinine is 5.75 with a baseline of 1.6.  Anion gap is 18.  Lactic acid is pending. Surgery is considering adequate drainage of the abscess versus IR placing a drain.  Past Medical History   . Acute combined systolic and diastolic heart failure ([place]) 06/2013   Class 3 - ECHO EF 35-40%  . Acute renal failure ([place])   . BMI 60.0-69.9, adult ([place])   . CHF (congestive heart failure) ([place]) 06/2013  . Hypertension   . Morbid obesity ([place])   . NICM (nonischemic cardiomyopathy) ([place]) 06/2013  . Obstructive sleep apnea (adult) (pediatric) 06/2013  . Other ascites 06/2013  . Other chronic nonalcoholic liver disease 07/2261   Significant Hospital Events   10/25 >> Admitted to [facility]  Consults:  Surgery, IR  Procedures:  NA  Significant Diagnostic Tests:  CT A/P  (10/25) >> Findings suggestive of extensive mid ileal enteritis and possible sigmoid colonic diverticulitis. There is an adjacent multilocular fluid collection consistent with an intra-abdominal abscess measuring 11.0 x 5.9 x 9.3 cm. Small amount of pneumoperitoneum  Micro Data:  Blood cultures (10/25) >> Pending   Antimicrobials:  Cefepime 10/25 Metronidazole 10/25  Fluconazole 10/25 >> Zosyn 10/25 >>  Interim history/subjective:   Overnight, patient was transferred to the ICU for close monitoring given hypotension. MAPs have been borderline averaging around 65. She is making some urine despite significant AKI. Phyllis Todd endorses abdominal pain with nausea and 1 large bilious episode of vomiting. She continues to be pass gas.  Objective   Blood pressure (!) 107/48, pulse 82, temperature 98.8 F (37.1 C), resp. rate (!) 23, height 5\' 4"  (1.626 m), weight 129.2 kg, SpO2 98 %.        Intake/Output Summary (Last 24 hours) at 12/07/2019 0715 Last data filed at 12/07/2019 0600 Gross per 24 hour  Intake 100.19 ml  Output 300 ml  Net -199.81 ml   Filed Weights   12/06/19 1919 12/07/19 0136  Weight: (!) 152 kg 129.2 kg   Examination: General: Obese white female in no acute distress HENT: Normal Lungs: Clear to auscultation bilaterally.  Cardiovascular: Regular rate and rhythm, 2/6 systolic murmur Abdomen: Generalized tenderness all over. No distension. Some minimal bowel sounds.  Extremities: Chronic lymphedema little in the way of acute edema now Neuro: Awake alert nonfocal  GU: N/A  Resolved Hospital Problem list   NA  Assessment & Plan:   # Septic Shock # Sigmoid Abdominal Abscess - s/p fluid resuscitation  - start low dose phenylephrine - Very likely may require central line with larger dose of pressors given lack of source control at this time. Patient was informed of this.  - Maintain MAPs> 65  - Continue Zosyn and Fluconazole  - Blood cultures pending -  Dilaudid PRN for pain - General surgery following; recommending IR drainage  - CT guided PCT drainage via IR ordered  # Acute Kidney Injury - in light of dehydration and sepsis # Hx of CKD3 - Baseline creatinine: ~ 1.6 - UA without evidence of ATN. BUN/Cr ratio of 20 supporting pre-renal cause.  - S/p 4.5 liter bolus of LR without significant improvement - Will continue maintenance fluids - Mildly hyperkalemia, will need to monitor closely  - Avoid nephrotoxic agents   # HFrEF with recovered EF # Nonischemic Cardiomyopathy  # Hypertension  - Last TTE with EF of 55-60% in 2019, G2DD - Will monitor closely for pulmonary edema with fluid resuscitation - Repeat TTE pending  - Hold home medications due to hypotension and AKI (Coreg, Losartan, and Spironolactone)   # Pulmonary hypertension  - Secondary to obstructive sleep apnea and chronic congestive heart failure  # NAFLD - No follow up with GI in chart  - Unknown if progressed to cirrhosis   Best practice:  Diet: N.p.o. Pain/Anxiety/Delirium protocol (if indicated): Pain medication as needed VAP protocol (if indicated): N/A DVT prophylaxis: We will hold anticoagulation for now.  Waiting IR surgical intervention. GI prophylaxis: N/A Glucose control: Monitor Mobility: Bedrest Code Status: Full Family Communication: Patient updated  Disposition: ICU  Labs   CBC: Recent Labs  Lab 12/06/19 1944 12/07/19 0239  WBC 31.5* 27.9*  NEUTROABS 26.5*  --   HGB 10.6* 10.1*  HCT 34.4* 32.3*  MCV 104.9* 102.9*  PLT 439* 324    Basic Metabolic Panel: Recent Labs  Lab 12/06/19 1944 12/07/19 0239  NA 134* 133*  K 5.5* 5.2*  CL 94* 99  CO2 22 19*  GLUCOSE 126* 112*  BUN 105* 102*  CREATININE 5.75* 5.26*  CALCIUM 11.5* 10.7*  MG  --  1.9  PHOS  --  3.9   GFR: Estimated Creatinine Clearance: 13.8 mL/min (A) (by C-G formula based on SCr of 5.26 mg/dL (H)). Recent Labs  Lab 12/06/19 1944 12/07/19 0239  WBC 31.5* 27.9*    LATICACIDVEN  --  1.4    Liver Function Tests: Recent Labs  Lab 12/06/19 1944  AST 13*  ALT 11  ALKPHOS 112  BILITOT 1.2  PROT 7.3  ALBUMIN 2.3*   Recent Labs  Lab 12/06/19 1944  LIPASE 28   No results for input(s): AMMONIA in the last 168 hours.  ABG    Component Value Date/Time   PHART 7.389 07/12/2013 1438   PCO2ART 50.6 (H) 07/12/2013 1438   PO2ART 51.0 (L) 07/12/2013 1438   HCO3 30.5 (H) 07/12/2013 1438   TCO2 32 07/12/2013 1438   O2SAT 85.0 07/12/2013 1438     Coagulation Profile: No results for input(s): INR, PROTIME in the last 168 hours.  Cardiac Enzymes: No results for input(s): CKTOTAL, CKMB, CKMBINDEX, TROPONINI in the last 168 hours.  HbA1C: No results found for: HGBA1C  CBG: Recent Labs  Lab 12/07/19 0128  GLUCAP 108*    Review of Systems:   Other than nausea mild vomiting negative on 14  point review of systems other than that mentioned in HPI.  Past Medical History  She,  has a past medical history of Acute combined systolic and diastolic heart failure ([place]) (06/2013), Acute renal failure ([place]), BMI 60.0-69.9, adult ([place]), CHF (congestive heart failure) ([place]) (06/2013), Hypertension, Morbid obesity ([place]), NICM (nonischemic cardiomyopathy) ([place]) (06/2013), Obstructive sleep apnea (adult) (pediatric) (06/2013), Other ascites (06/2013), and Other chronic nonalcoholic liver disease (07/7339).   Surgical History    Past Surgical History:  Procedure Laterality Date  . CARDIAC CATHETERIZATION  2015  . CESAREAN SECTION    . DILATION AND CURETTAGE OF UTERUS  1984  . LEFT HEART CATHETERIZATION WITH CORONARY ANGIOGRAM N/A 07/12/2013   Procedure: LEFT HEART CATHETERIZATION WITH CORONARY ANGIOGRAM;  Surgeon: Troy Sine, MD;  Location: [facility] CATH LAB;  Service: Cardiovascular;  Laterality: N/A;  . TONSILLECTOMY       Social History   reports that she has never smoked. She has never used smokeless tobacco. She reports current alcohol use. She reports that  she does not use drugs.   Family History   Her family history includes Breast cancer in her maternal grandmother; CAD in her father; Diabetes in her brother; Heart failure in her mother; Hypertension in her brother, father, and mother; Stroke in her mother; Stroke (age of onset: 31) in her brother.   Allergies Allergies  Allergen Reactions  . Codeine Other (See Comments)    "makes me hyper"  . Demerol [Meperidine] Other (See Comments)    "sick"  . Lisinopril Cough  . Peanut-Containing Drug Products Hives and Swelling    "almonds"     Home Medications  Prior to Admission medications   Medication Sig Start Date End Date Taking? Authorizing Provider  acetaminophen (TYLENOL) 500 MG tablet Take 500 mg by mouth every 6 (six) hours as needed (pain).   Yes [provider]  aspirin EC 81 MG EC tablet Take 1 tablet (81 mg total) by mouth daily. 07/13/13  Yes Charlynne Cousins, MD  carvedilol (COREG) 25 MG tablet TAKE (1) TABLET TWICE A DAY WITH FOOD---BREAKFAST AND SUPPER. Patient taking differently: Take 25 mg by mouth 2 (two) times daily with a meal.  02/24/19  Yes Larey Dresser, MD  furosemide (LASIX) 40 MG tablet Take 1 tablet (40 mg total) by mouth daily. Must have office visit please call [phone] 10/25/19  Yes Larey Dresser, MD  losartan (COZAAR) 50 MG tablet TAKE 1 TABLET BY MOUTH TWICE DAILY 11/30/19  Yes Larey Dresser, MD  spironolactone (ALDACTONE) 25 MG tablet TAKE 1 TABLET BY MOUTH ONCE DAILY. 11/30/19  Yes Larey Dresser, MD     Dr. Jose Persia Internal Medicine PGY-2   12/07/2019, 8:07 AM

## 2019-12-07 NOTE — Procedures (Signed)
Interventional Radiology Procedure Note  Procedure: CT Guided Drainage of diverticular abscess  Complications: None  Estimated Blood Loss: < 10 mL  Findings: 12 Fr drain placed in pelvic diverticular abscess with return of foul-smelling feculent fluid. Fluid sample sent for culture analysis. Drain attached to suction bulb drainage.  Will follow.  Venetia Night. Kathlene Cote, M.D Pager:  [phone]

## 2019-12-07 NOTE — Progress Notes (Signed)
Pharmacy Antibiotic Note  Phyllis Todd is a 69 y.o. female admitted on 12/06/2019 with intra-abdominal abscess.  Pharmacy has been consulted for Zosyn dosing. WBC elevated. Acute on chronic renal failure.   Plan: Zosyn 2.25g IV q8h Fluconazole per MD Trend WBC, temp, renal function  F/U infectious work-up  Height: 5\' 4"  (162.6 cm) Weight: (!) 152 kg (335 lb 1.6 oz) IBW/kg (Calculated) : 54.7  Temp (24hrs), Avg:97.8 F (36.6 C), Min:97.6 F (36.4 C), Max:97.9 F (36.6 C)  Recent Labs  Lab 12/06/19 1944  WBC 31.5*  CREATININE 5.75*    Estimated Creatinine Clearance: 14 mL/min (A) (by C-G formula based on SCr of 5.75 mg/dL (H)).    Allergies  Allergen Reactions  . Codeine Other (See Comments)    "makes me hyper"  . Demerol [Meperidine] Other (See Comments)    "sick"  . Lisinopril Cough  . Peanut-Containing Drug Products Hives and Swelling    "almonds"    Narda Bonds, PharmD, [place] Clinical Pharmacist Phone: [phone]

## 2019-12-07 NOTE — Progress Notes (Addendum)
eLink Physician-Brief Progress Note Patient Name: Phyllis Todd DOB: 10/10/1950 MRN: [id number]   Date of Service  12/07/2019  HPI/Events of Note  69 yo female with PMH of CHF with EF = 81% (Diastolic?), NASH and HTN. Presents with lower abdominal pain. W/U reveasl intra-abdominal abscess likely d/t perforated divertic. Surgery has seen the patient in consultation and plans IR drainage in AM. Hypotension on presentation. MAP >= 65 s/p 2 liters of crystalloid. BP soft since arrival in ICU BP = 115/45 with MAP = 67. Patient weighs 129 kg. No CVP or CVL. Has only received 3 liter IV fluid challenge. Her 30 mL/kg fluid challenge volume should be closer to 5 liters.    eICU Interventions  Plan: 1. Bolus with 0.9 NaCl 1 liter IV over 1 hour now.  2. Phenylephrine IV infusion via PIV. Titrate to MAP >= 65.     Intervention Category Major Interventions: Hypotension - evaluation and management  Phyllis Todd 12/07/2019, 2:24 AM

## 2019-12-08 DIAGNOSIS — R6521 Severe sepsis with septic shock: Secondary | ICD-10-CM

## 2019-12-08 DIAGNOSIS — A419 Sepsis, unspecified organism: Secondary | ICD-10-CM | POA: Diagnosis not present

## 2019-12-08 DIAGNOSIS — K651 Peritoneal abscess: Secondary | ICD-10-CM | POA: Diagnosis not present

## 2019-12-08 LAB — COMPREHENSIVE METABOLIC PANEL
ALT: 10 U/L (ref 0–44)
AST: 11 U/L — ABNORMAL LOW (ref 15–41)
Albumin: 1.8 g/dL — ABNORMAL LOW (ref 3.5–5.0)
Alkaline Phosphatase: 108 U/L (ref 38–126)
Anion gap: 17 — ABNORMAL HIGH (ref 5–15)
BUN: 92 mg/dL — ABNORMAL HIGH (ref 8–23)
CO2: 15 mmol/L — ABNORMAL LOW (ref 22–32)
Calcium: 10.2 mg/dL (ref 8.9–10.3)
Chloride: 104 mmol/L (ref 98–111)
Creatinine, Ser: 4.58 mg/dL — ABNORMAL HIGH (ref 0.44–1.00)
GFR, Estimated: 10 mL/min — ABNORMAL LOW (ref >60)
Glucose, Bld: 153 mg/dL — ABNORMAL HIGH (ref 70–99)
Potassium: 5.5 mmol/L — ABNORMAL HIGH (ref 3.5–5.1)
Sodium: 136 mmol/L (ref 135–145)
Total Bilirubin: 1.5 mg/dL — ABNORMAL HIGH (ref 0.3–1.2)
Total Protein: 6.1 g/dL — ABNORMAL LOW (ref 6.5–8.1)

## 2019-12-08 LAB — CBC WITH DIFFERENTIAL/PLATELET
Abs Immature Granulocytes: 1.77 10*3/uL — ABNORMAL HIGH (ref 0.00–0.07)
Basophils Absolute: 0 10*3/uL (ref 0.0–0.1)
Basophils Relative: 0 %
Eosinophils Absolute: 0 10*3/uL (ref 0.0–0.5)
Eosinophils Relative: 0 %
HCT: 31.2 % — ABNORMAL LOW (ref 36.0–46.0)
Hemoglobin: 9.6 g/dL — ABNORMAL LOW (ref 12.0–15.0)
Immature Granulocytes: 5 %
Lymphocytes Relative: 2 %
Lymphs Abs: 0.9 10*3/uL (ref 0.7–4.0)
MCH: 32.4 pg (ref 26.0–34.0)
MCHC: 30.8 g/dL (ref 30.0–36.0)
MCV: 105.4 fL — ABNORMAL HIGH (ref 80.0–100.0)
Monocytes Absolute: 1.1 10*3/uL — ABNORMAL HIGH (ref 0.1–1.0)
Monocytes Relative: 3 %
Neutro Abs: 35.7 10*3/uL — ABNORMAL HIGH (ref 1.7–7.7)
Neutrophils Relative %: 90 %
Platelets: 455 10*3/uL — ABNORMAL HIGH (ref 150–400)
RBC: 2.96 MIL/uL — ABNORMAL LOW (ref 3.87–5.11)
RDW: 13.4 % (ref 11.5–15.5)
WBC: 39.5 10*3/uL — ABNORMAL HIGH (ref 4.0–10.5)
nRBC: 0 % (ref 0.0–0.2)

## 2019-12-08 LAB — GLUCOSE, CAPILLARY
Glucose-Capillary: 148 mg/dL — ABNORMAL HIGH (ref 70–99)
Glucose-Capillary: 154 mg/dL — ABNORMAL HIGH (ref 70–99)
Glucose-Capillary: 124 mg/dL — ABNORMAL HIGH (ref 70–99)
Glucose-Capillary: 118 mg/dL — ABNORMAL HIGH (ref 70–99)
Glucose-Capillary: 122 mg/dL — ABNORMAL HIGH (ref 70–99)

## 2019-12-08 LAB — BASIC METABOLIC PANEL
Anion gap: 13 (ref 5–15)
BUN: 87 mg/dL — ABNORMAL HIGH (ref 8–23)
CO2: 20 mmol/L — ABNORMAL LOW (ref 22–32)
Calcium: 10.2 mg/dL (ref 8.9–10.3)
Chloride: 104 mmol/L (ref 98–111)
Creatinine, Ser: 4.4 mg/dL — ABNORMAL HIGH (ref 0.44–1.00)
GFR, Estimated: 10 mL/min — ABNORMAL LOW (ref >60)
Glucose, Bld: 173 mg/dL — ABNORMAL HIGH (ref 70–99)
Potassium: 5.2 mmol/L — ABNORMAL HIGH (ref 3.5–5.1)
Sodium: 137 mmol/L (ref 135–145)

## 2019-12-08 LAB — LACTIC ACID, PLASMA
Lactic Acid, Venous: 0.8 mmol/L (ref 0.5–1.9)
Lactic Acid, Venous: 0.8 mmol/L (ref 0.5–1.9)

## 2019-12-08 MED ORDER — SODIUM CHLORIDE 0.9 % IV SOLN
100.0000 mg | INTRAVENOUS | Status: DC
  Administered 2019-12-09 – 2019-12-10 (×2): 100 mg via INTRAVENOUS
  Filled 2019-12-08 (×3): qty 100

## 2019-12-08 MED ORDER — SODIUM CHLORIDE 0.9 % IV SOLN
200.0000 mg | Freq: Once | INTRAVENOUS | Status: AC
  Administered 2019-12-08: 200 mg via INTRAVENOUS
  Filled 2019-12-08: qty 200

## 2019-12-08 MED ORDER — ACETAMINOPHEN 325 MG PO TABS
650.0000 mg | ORAL_TABLET | Freq: Four times a day (QID) | ORAL | Status: DC | PRN
  Administered 2019-12-09 – 2019-12-12 (×8): 650 mg via ORAL
  Filled 2019-12-08 (×8): qty 2

## 2019-12-08 MED ORDER — BISACODYL 10 MG RE SUPP
10.0000 mg | Freq: Once | RECTAL | Status: AC
  Administered 2019-12-08: 10 mg via RECTAL
  Filled 2019-12-08: qty 1

## 2019-12-08 MED ORDER — HEPARIN SODIUM (PORCINE) 5000 UNIT/ML IJ SOLN
5000.0000 [IU] | Freq: Three times a day (TID) | INTRAMUSCULAR | Status: AC
  Administered 2019-12-08 (×2): 5000 [IU] via SUBCUTANEOUS
  Filled 2019-12-08 (×2): qty 1

## 2019-12-08 MED ORDER — INSULIN ASPART 100 UNIT/ML ~~LOC~~ SOLN
0.0000 [IU] | SUBCUTANEOUS | Status: DC
  Administered 2019-12-08 (×2): 1 [IU] via SUBCUTANEOUS
  Administered 2019-12-08: 2 [IU] via SUBCUTANEOUS
  Administered 2019-12-08 – 2019-12-09 (×2): 1 [IU] via SUBCUTANEOUS

## 2019-12-08 MED ORDER — SODIUM ZIRCONIUM CYCLOSILICATE 5 G PO PACK
10.0000 g | PACK | Freq: Every day | ORAL | Status: DC
  Administered 2019-12-08: 10 g via ORAL
  Filled 2019-12-08: qty 2

## 2019-12-08 MED ORDER — SODIUM ZIRCONIUM CYCLOSILICATE 5 G PO PACK
10.0000 g | PACK | Freq: Once | ORAL | Status: AC
  Administered 2019-12-08: 10 g via ORAL
  Filled 2019-12-08: qty 2

## 2019-12-08 MED ORDER — SODIUM CHLORIDE 0.9% FLUSH: 10.0000 mL | INTRAVENOUS | Status: DC | PRN

## 2019-12-08 MED ORDER — SODIUM CHLORIDE 0.9% FLUSH
10.0000 mL | Freq: Two times a day (BID) | INTRAVENOUS | Status: DC
  Administered 2019-12-08 – 2019-12-10 (×5): 10 mL
  Administered 2019-12-10: 20 mL
  Administered 2019-12-11 – 2019-12-16 (×7): 10 mL

## 2019-12-08 NOTE — Progress Notes (Signed)
[facility] Progress Note     Subjective: CC:  States she feels a lot better - sharp LLQ pain improved. Now reports muscular pain/soreness. +flatus. Denies BM. Denies nausea or vomiting.    Echo w/ EF 55% Objective: Vital signs in last 24 hours: Temp:  [98.6 F (37 C)-99.1 F (37.3 C)] 99.1 F (37.3 C) (10/27 0640) Pulse Rate:  [61-88] 85 (10/27 0640) Resp:  [13-29] 27 (10/27 0640) BP: (66-131)/(20-98) 131/98 (10/27 3419) SpO2:  [81 %-100 %] 94 % (10/27 0640) Weight:  [134.1 kg] 134.1 kg (10/27 0500) Last BM Date: 12/02/19  Intake/Output from previous day: 10/26 0701 - 10/27 0700 In: 3617 [I.V.:3298.4; IV Piggyback:313.6] Out: 2420 [Urine:1980; Drains:440] Intake/Output this shift: No intake/output data recorded.  PE:  Gen:  Alert, NAD, non-toxic appearing female who appears states age Card:  Regular rate and rhythm, heart murmur present Pulm:  Normal effort on nasal cannula, clear to auscultation bilaterally Abd: Soft, obese, JP in place with purulent drainage (440 cc/24h), no bowel sounds appreciated Skin: warm and dry, skin breakdown inframammary fold and groin Psych: A&Ox3   Lab Results:  Recent Labs    12/07/19 0239 12/08/19 0549  WBC 27.9* PENDING  HGB 10.1* 9.6*  HCT 32.3* 31.2*  PLT 356 455*   BMET Recent Labs    12/07/19 1829 12/08/19 0549  NA 136 136  K 4.9 5.5*  CL 102 104  CO2 22 15*  GLUCOSE 132* 153*  BUN 93* 92*  CREATININE 4.74* 4.58*  CALCIUM 10.3 10.2   PT/INR Recent Labs    12/07/19 1158  LABPROT 15.2  INR 1.2   CMP     Component Value Date/Time   NA 136 12/08/2019 0549   NA 140 01/14/2014 0921   K 5.5 (H) 12/08/2019 0549   CL 104 12/08/2019 0549   CO2 15 (L) 12/08/2019 0549   GLUCOSE 153 (H) 12/08/2019 0549   BUN 92 (H) 12/08/2019 0549   BUN 29 (H) 01/14/2014 0921   CREATININE 4.58 (H) 12/08/2019 0549   CALCIUM 10.2 12/08/2019 0549   PROT 6.1 (L) 12/08/2019 0549   ALBUMIN 1.8 (L) 12/08/2019 0549   AST  11 (L) 12/08/2019 0549   ALT 10 12/08/2019 0549   ALKPHOS 108 12/08/2019 0549   BILITOT 1.5 (H) 12/08/2019 0549   GFRNONAA 10 (L) 12/08/2019 0549   GFRAA 38 (L) 03/13/2018 1053   Lipase     Component Value Date/Time   LIPASE 28 12/06/2019 1944       Studies/Results: CT ABDOMEN PELVIS WO CONTRAST  Result Date: 12/06/2019 CLINICAL DATA:  Right lower quadrant abdominal pain EXAM: CT ABDOMEN AND PELVIS WITHOUT CONTRAST TECHNIQUE: Multidetector CT imaging of the abdomen and pelvis was performed following the standard protocol without IV contrast. COMPARISON:  None. FINDINGS: Lower chest: The visualized heart size within normal limits. No pericardial fluid/thickening. No hiatal hernia. The visualized portions of the lungs are clear. Hepatobiliary: Although limited due to the lack of intravenous contrast, normal in appearance without gross focal abnormality. No evidence of calcified gallstones or biliary ductal dilatation. Pancreas:  Unremarkable.  No surrounding inflammatory changes. Spleen: Normal in size. Although limited due to the lack of intravenous contrast, normal in appearance. Adrenals/Urinary Tract: Both adrenal glands appear normal. Right-sided renal calculi are noted the largest within the midpole measures 1 cm. No right-sided hydronephrosis. No left-sided renal or collecting system calculi are seen. Abutting the superior surface of the bladder is the loculated pericolonic collection with mild wall thickening  at the superior bladder which may be from the adjacent inflammatory changes. Stomach/Bowel: The stomach and proximal small bowel are unremarkable. Within the mid abdomen within a mid ileal loops there appears to be diffuse wall thickening and significant surrounding inflammatory changes. There is an adjacent air and fluid loculated collection measuring approximately 11.0 x 5.9 by 9.3 cm. There is also fat stranding changes seen around the mid sigmoid colon with diverticula. The  remainder of the colon is unremarkable. Vascular/Lymphatic: There are no enlarged abdominal or pelvic lymph nodes. Scattered mild aortic atherosclerosis is seen. Reproductive: The prostate is unremarkable. Coarse calcifications are seen within the prostate. Other: A small amount of pneumoperitoneum seen along the anterior abdominal wall and under the diaphragms. Musculoskeletal: No acute or significant osseous findings. IMPRESSION: Findings suggestive of extensive mid ileal enteritis and possible sigmoid colonic diverticulitis. There is an adjacent multilocular fluid collection consistent with an intra-abdominal abscess measuring 11.0 x 5.9 x 9.3 cm. Small amount of pneumoperitoneum Nonobstructing right renal calculi Aortic Atherosclerosis (ICD10-I70.0). These results were called by telephone at the time of interpretation on 12/06/2019 at 9:42 pm to provider who verbally acknowledged these results. Electronically Signed   By: Prudencio Pair M.D.   On: 12/06/2019 21:43   DG CHEST PORT 1 VIEW  Result Date: 12/07/2019 CLINICAL DATA:  Central venous catheter placement EXAM: PORTABLE CHEST 1 VIEW COMPARISON:  December 06, 2019 FINDINGS: Central catheter tip is in the superior vena cava. No pneumothorax. Lungs are clear. Heart is slightly enlarged with pulmonary vascularity normal. No adenopathy. No bone lesions. IMPRESSION: Central catheter tip in superior vena cava. No pneumothorax. Lungs clear. Stable cardiac prominence. Electronically Signed   By: Lowella Grip III M.D.   On: 12/07/2019 11:41   DG Chest Portable 1 View  Result Date: 12/06/2019 CLINICAL DATA:  Hypotension. Lower abdominal pain and diarrhea. Vomiting. EXAM: PORTABLE CHEST 1 VIEW COMPARISON:  Radiograph 07/17/2013. Included portions from lung basis of abdominal CT earlier today. FINDINGS: The cardiomediastinal contours are normal. Upper normal heart size. Mild elevation of right hemidiaphragm pulmonary vasculature is normal. No consolidation,  pleural effusion, or pneumothorax. No acute osseous abnormalities are seen. Free air in the abdomen on concurrent abdominal CT is not well demonstrated by radiograph. IMPRESSION: 1. No acute chest findings. 2. Mild chronic elevation of right hemidiaphragm. Electronically Signed   By: Keith Rake M.D.   On: 12/06/2019 21:46   ECHOCARDIOGRAM COMPLETE  Result Date: 12/07/2019    ECHOCARDIOGRAM REPORT   Patient Name:   Phyllis Todd Date of Exam: 12/07/2019 Medical Rec #:  [id number]   Height:       64.0 in Accession #:    [id number]  Weight:       284.8 lb Date of Birth:  10-31-1950   BSA:          2.274 m Patient Age:    42 years    BP:           95/54 mmHg Patient Gender: F           HR:           79 bpm. Exam Location:  Inpatient Procedure: 2D Echo, Cardiac Doppler, Color Doppler and Intracardiac            Opacification Agent Indications:    K74.25 Chronic systolic (congestive) heart failure  History:        Patient has prior history of Echocardiogram examinations, most  recent 11/24/2017. Cardiomyopathy; Risk Factors:Hypertension.                 Renal Failure.  Sonographer:    Jonelle Sidle Dance Referring Phys: [id number] Price  1. Left ventricular ejection fraction, by estimation, is 55 to 60%. The left ventricle has normal function. The left ventricle has no regional wall motion abnormalities. Left ventricular diastolic parameters were normal.  2. Right ventricular systolic function is normal. The right ventricular size is mildly enlarged. There is normal pulmonary artery systolic pressure.  3. Left atrial size was mildly dilated.  4. The mitral valve is normal in structure. No evidence of mitral valve regurgitation. No evidence of mitral stenosis.  5. The aortic valve is normal in structure. Aortic valve regurgitation is not visualized. No aortic stenosis is present.  6. The inferior vena cava is dilated in size with >50% respiratory variability, suggesting right atrial  pressure of 8 mmHg. Comparison(s): Prior images reviewed side by side. The left ventricular function is unchanged. FINDINGS  Left Ventricle: Left ventricular ejection fraction, by estimation, is 55 to 60%. The left ventricle has normal function. The left ventricle has no regional wall motion abnormalities. Definity contrast agent was given IV to delineate the left ventricular  endocardial borders. The left ventricular internal cavity size was normal in size. There is no left ventricular hypertrophy. Left ventricular diastolic parameters were normal. Right Ventricle: The right ventricular size is mildly enlarged. No increase in right ventricular wall thickness. Right ventricular systolic function is normal. There is normal pulmonary artery systolic pressure. The tricuspid regurgitant velocity is 2.13  m/s, and with an assumed right atrial pressure of 8 mmHg, the estimated right ventricular systolic pressure is 81.8 mmHg. Left Atrium: Left atrial size was mildly dilated. Right Atrium: Right atrial size was normal in size. Pericardium: There is no evidence of pericardial effusion. Mitral Valve: The mitral valve is normal in structure. No evidence of mitral valve regurgitation. No evidence of mitral valve stenosis. Tricuspid Valve: The tricuspid valve is normal in structure. Tricuspid valve regurgitation is not demonstrated. No evidence of tricuspid stenosis. Aortic Valve: The aortic valve is normal in structure. Aortic valve regurgitation is not visualized. No aortic stenosis is present. Pulmonic Valve: The pulmonic valve was normal in structure. Pulmonic valve regurgitation is not visualized. No evidence of pulmonic stenosis. Aorta: The aortic root is normal in size and structure. Venous: The inferior vena cava is dilated in size with greater than 50% respiratory variability, suggesting right atrial pressure of 8 mmHg. IAS/Shunts: No atrial level shunt detected by color flow Doppler.  LEFT VENTRICLE PLAX 2D LVIDd:          5.23 cm  Diastology LVIDs:         3.35 cm  LV e' medial:    5.98 cm/s LV PW:         1.01 cm  LV E/e' medial:  14.4 LV IVS:        1.02 cm  LV e' lateral:   10.20 cm/s LVOT diam:     1.90 cm  LV E/e' lateral: 8.5 LV SV:         64 LV SV Index:   28 LVOT Area:     2.84 cm  RIGHT VENTRICLE             IVC RV Basal diam:  2.97 cm     IVC diam: 2.07 cm RV S prime:     12.00 cm/s TAPSE (M-mode): 2.1  cm LEFT ATRIUM             Index       RIGHT ATRIUM           Index LA diam:        4.50 cm 1.98 cm/m  RA Area:     18.20 cm LA Vol (A2C):   61.4 ml 27.01 ml/m RA Volume:   52.20 ml  22.96 ml/m LA Vol (A4C):   70.6 ml 31.05 ml/m LA Biplane Vol: 68.4 ml 30.08 ml/m  AORTIC VALVE LVOT Vmax:   99.80 cm/s LVOT Vmean:  67.800 cm/s LVOT VTI:    0.224 m  AORTA Ao Root diam: 3.10 cm Ao Asc diam:  3.10 cm MITRAL VALVE               TRICUSPID VALVE MV Area (PHT): 3.27 cm    TR Peak grad:   18.1 mmHg MV Decel Time: 232 msec    TR Vmax:        213.00 cm/s MV E velocity: 86.40 cm/s MV A velocity: 80.40 cm/s  SHUNTS MV E/A ratio:  1.07        Systemic VTI:  0.22 m                            Systemic Diam: 1.90 cm Dani Gobble Croitoru MD Electronically signed by Sanda Klein MD Signature Date/Time: 12/07/2019/3:44:13 PM    Final    CT IMAGE GUIDED DRAINAGE BY PERCUTANEOUS CATHETER  Result Date: 12/07/2019 CLINICAL DATA:  Large diverticular abscess of the colon within the pelvis. EXAM: CT GUIDED CATHETER DRAINAGE OF PERITONEAL ABSCESS ANESTHESIA/SEDATION: 1.0 mg IV Versed 50 mcg IV Fentanyl Total Moderate Sedation Time:  22 minutes The patient's level of consciousness and physiologic status were continuously monitored during the procedure by Radiology nursing. PROCEDURE: The procedure, risks, benefits, and alternatives were explained to the patient. Questions regarding the procedure were encouraged and answered. The patient understands and consents to the procedure. A time out was performed prior to initiating the procedure. CT  was performed of the lower abdomen and pelvis in a supine position. The left lower abdominal wall was prepped with chlorhexidine in a sterile fashion, and a sterile drape was applied covering the operative field. A sterile gown and sterile gloves were used for the procedure. Local anesthesia was provided with 1% Lidocaine. An 18 gauge trocar needle was advanced under CT guidance to the level of the diverticular abscess. After return of air and fluid, a guidewire was advanced into the collection and the needle removed. The percutaneous tract was dilated. A 12 French percutaneous drainage catheter was advanced into the abscess. Fluid sample was aspirated and sent for culture analysis. The drain was connected to a suction bulb. It was secured at the skin with a Prolene retention suture and StatLock device. COMPLICATIONS: None FINDINGS: Large unilocular diverticular abscess containing air-fluid level was localized. After needle access, there was return of foul-smelling, feculent fluid. After drain placement, there is rapid return brown feculent fluid. IMPRESSION: CT-guided percutaneous drainage of diverticular abscess. A 12 French drain was placed and attached to suction bulb drainage. A sample of feculent fluid was sent for culture analysis. Electronically Signed   By: Aletta Edouard M.D.   On: 12/07/2019 16:20    Anti-infectives: Anti-infectives (From admission, onward)   Start     Dose/Rate Route Frequency Ordered Stop   12/07/19 0600  metroNIDAZOLE (FLAGYL) IVPB 500 mg  Status:  Discontinued        500 mg 100 mL/hr over 60 Minutes Intravenous Every 8 hours 12/07/19 0030 12/07/19 0122   12/07/19 0600  piperacillin-tazobactam (ZOSYN) IVPB 2.25 g        2.25 g 100 mL/hr over 30 Minutes Intravenous Every 8 hours 12/07/19 0122     12/07/19 0130  fluconazole (DIFLUCAN) IVPB 200 mg        200 mg 100 mL/hr over 60 Minutes Intravenous Every 24 hours 12/07/19 0030     12/06/19 2200  ceFEPIme (MAXIPIME) 2 g in  sodium chloride 0.9 % 100 mL IVPB        2 g 200 mL/hr over 30 Minutes Intravenous  Once 12/06/19 2146 12/06/19 2330   12/06/19 2200  metroNIDAZOLE (FLAGYL) IVPB 500 mg        500 mg 100 mL/hr over 60 Minutes Intravenous  Once 12/06/19 2146 12/06/19 2358     Assessment/Plan Morbid obesity 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 GS w/ GNR, GPC, GPR, follow cultures/sensitivites; monitor output  - afebrile, WBC pending, lactate pending - wound continue NPO, IVF, bowel rest today  - ongoing pressure requirement on vaso/levo; CCM has added stress dose steroids yesterday afternoon, discussed with them and they will stop these for now; failure for sepsis to improve with IR drainage may warrant surgical exploration. CCS will follow closely. - In the setting of multiple above medical comorbidities perioperative morbidity and mortality are high. According to NSQIP patient has a 34.8% likelihood of serious perioperative complication, 5.7% risk of cardiac complication, and 26.2% chance of perioperative mortality. I discussed the possibility of surgery with the patient if non-operative measures are unsuccessful or her condition worsens and she states if she was going to die without an operation she would want to go to surgery.  - Greatly appreciate CCM's care of this patient   FEN: NPO, IVF ID: [email or web address], fluconazole 10/25 >>  VTE: SCD's, ok for SQH Foley: in place for strict I&Os, 1,980 cc urine/24h  Dispo: ICU   LOS: 1 day    Obie Dredge, [facility] Please see Amion for pager number during day hours 7:00am-4:30pm

## 2019-12-08 NOTE — Progress Notes (Signed)
Referring Physician(s): Dr Jonny Ruiz  Supervising Physician: Dr Ruthann Cancer  Patient Status:  [facility] - In-pt  Chief Complaint:  Diverticular abscess  Subjective:  Pt feeling better OP is milky yellow and odorous  Allergies: Codeine, Demerol [meperidine], Lisinopril, and Peanut-containing drug products  Medications: Prior to Admission medications   Medication Sig Start Date End Date Taking? Authorizing Provider  acetaminophen (TYLENOL) 500 MG tablet Take 500 mg by mouth every 6 (six) hours as needed (pain).   Yes [provider]  aspirin EC 81 MG EC tablet Take 1 tablet (81 mg total) by mouth daily. 07/13/13  Yes Charlynne Cousins, MD  carvedilol (COREG) 25 MG tablet TAKE (1) TABLET TWICE A DAY WITH FOOD---BREAKFAST AND SUPPER. Patient taking differently: Take 25 mg by mouth 2 (two) times daily with a meal.  02/24/19  Yes Larey Dresser, MD  furosemide (LASIX) 40 MG tablet Take 1 tablet (40 mg total) by mouth daily. Must have office visit please call [phone] 10/25/19  Yes Larey Dresser, MD  losartan (COZAAR) 50 MG tablet TAKE 1 TABLET BY MOUTH TWICE DAILY 11/30/19  Yes Larey Dresser, MD  spironolactone (ALDACTONE) 25 MG tablet TAKE 1 TABLET BY MOUTH ONCE DAILY. 11/30/19  Yes Larey Dresser, MD     Vital Signs: BP (!) 131/98   Pulse 85   Temp 99.3 F (37.4 C)   Resp (!) 27   Ht 5\' 4"  (1.626 m)   Wt 295 lb 10.2 oz (134.1 kg)   SpO2 94%   BMI 50.75 kg/m   Physical Exam Vitals reviewed.  Skin:    General: Skin is warm.     Comments: Skin is NT no bleeding Clean and dry  OP milky yellow 440 cc OP yesterday 100 cc in bulb  FEW GRAM NEGATIVE RODS  ABUNDANT STREPTOCOCCUS ANGINOSIS      Imaging: CT ABDOMEN PELVIS WO CONTRAST  Result Date: 12/06/2019 CLINICAL DATA:  Right lower quadrant abdominal pain EXAM: CT ABDOMEN AND PELVIS WITHOUT CONTRAST TECHNIQUE: Multidetector CT imaging of the abdomen and pelvis was performed following the  standard protocol without IV contrast. COMPARISON:  None. FINDINGS: Lower chest: The visualized heart size within normal limits. No pericardial fluid/thickening. No hiatal hernia. The visualized portions of the lungs are clear. Hepatobiliary: Although limited due to the lack of intravenous contrast, normal in appearance without gross focal abnormality. No evidence of calcified gallstones or biliary ductal dilatation. Pancreas:  Unremarkable.  No surrounding inflammatory changes. Spleen: Normal in size. Although limited due to the lack of intravenous contrast, normal in appearance. Adrenals/Urinary Tract: Both adrenal glands appear normal. Right-sided renal calculi are noted the largest within the midpole measures 1 cm. No right-sided hydronephrosis. No left-sided renal or collecting system calculi are seen. Abutting the superior surface of the bladder is the loculated pericolonic collection with mild wall thickening at the superior bladder which may be from the adjacent inflammatory changes. Stomach/Bowel: The stomach and proximal small bowel are unremarkable. Within the mid abdomen within a mid ileal loops there appears to be diffuse wall thickening and significant surrounding inflammatory changes. There is an adjacent air and fluid loculated collection measuring approximately 11.0 x 5.9 by 9.3 cm. There is also fat stranding changes seen around the mid sigmoid colon with diverticula. The remainder of the colon is unremarkable. Vascular/Lymphatic: There are no enlarged abdominal or pelvic lymph nodes. Scattered mild aortic atherosclerosis is seen. Reproductive: The prostate is unremarkable. Coarse calcifications are seen within the  prostate. Other: A small amount of pneumoperitoneum seen along the anterior abdominal wall and under the diaphragms. Musculoskeletal: No acute or significant osseous findings. IMPRESSION: Findings suggestive of extensive mid ileal enteritis and possible sigmoid colonic diverticulitis.  There is an adjacent multilocular fluid collection consistent with an intra-abdominal abscess measuring 11.0 x 5.9 x 9.3 cm. Small amount of pneumoperitoneum Nonobstructing right renal calculi Aortic Atherosclerosis (ICD10-I70.0). These results were called by telephone at the time of interpretation on 12/06/2019 at 9:42 pm to provider who verbally acknowledged these results. Electronically Signed   By: Prudencio Pair M.D.   On: 12/06/2019 21:43   DG CHEST PORT 1 VIEW  Result Date: 12/07/2019 CLINICAL DATA:  Central venous catheter placement EXAM: PORTABLE CHEST 1 VIEW COMPARISON:  December 06, 2019 FINDINGS: Central catheter tip is in the superior vena cava. No pneumothorax. Lungs are clear. Heart is slightly enlarged with pulmonary vascularity normal. No adenopathy. No bone lesions. IMPRESSION: Central catheter tip in superior vena cava. No pneumothorax. Lungs clear. Stable cardiac prominence. Electronically Signed   By: Lowella Grip III M.D.   On: 12/07/2019 11:41   DG Chest Portable 1 View  Result Date: 12/06/2019 CLINICAL DATA:  Hypotension. Lower abdominal pain and diarrhea. Vomiting. EXAM: PORTABLE CHEST 1 VIEW COMPARISON:  Radiograph 07/17/2013. Included portions from lung basis of abdominal CT earlier today. FINDINGS: The cardiomediastinal contours are normal. Upper normal heart size. Mild elevation of right hemidiaphragm pulmonary vasculature is normal. No consolidation, pleural effusion, or pneumothorax. No acute osseous abnormalities are seen. Free air in the abdomen on concurrent abdominal CT is not well demonstrated by radiograph. IMPRESSION: 1. No acute chest findings. 2. Mild chronic elevation of right hemidiaphragm. Electronically Signed   By: Keith Rake M.D.   On: 12/06/2019 21:46   ECHOCARDIOGRAM COMPLETE  Result Date: 12/07/2019    ECHOCARDIOGRAM REPORT   Patient Name:   Phyllis Todd Date of Exam: 12/07/2019 Medical Rec #:  [id number]   Height:       64.0 in Accession #:     [id number]  Weight:       284.8 lb Date of Birth:  1950/02/20   BSA:          2.274 m Patient Age:    69 years    BP:           95/54 mmHg Patient Gender: F           HR:           79 bpm. Exam Location:  Inpatient Procedure: 2D Echo, Cardiac Doppler, Color Doppler and Intracardiac            Opacification Agent Indications:    V37.10 Chronic systolic (congestive) heart failure  History:        Patient has prior history of Echocardiogram examinations, most                 recent 11/24/2017. Cardiomyopathy; Risk Factors:Hypertension.                 Renal Failure.  Sonographer:    Jonelle Sidle Dance Referring Phys: [id number] [place]  1. Left ventricular ejection fraction, by estimation, is 55 to 60%. The left ventricle has normal function. The left ventricle has no regional wall motion abnormalities. Left ventricular diastolic parameters were normal.  2. Right ventricular systolic function is normal. The right ventricular size is mildly enlarged. There is normal pulmonary artery systolic pressure.  3. Left atrial size was mildly dilated.  4. The mitral valve is normal in structure. No evidence of mitral valve regurgitation. No evidence of mitral stenosis.  5. The aortic valve is normal in structure. Aortic valve regurgitation is not visualized. No aortic stenosis is present.  6. The inferior vena cava is dilated in size with >50% respiratory variability, suggesting right atrial pressure of 8 mmHg. Comparison(s): Prior images reviewed side by side. The left ventricular function is unchanged. FINDINGS  Left Ventricle: Left ventricular ejection fraction, by estimation, is 55 to 60%. The left ventricle has normal function. The left ventricle has no regional wall motion abnormalities. Definity contrast agent was given IV to delineate the left ventricular  endocardial borders. The left ventricular internal cavity size was normal in size. There is no left ventricular hypertrophy. Left ventricular diastolic  parameters were normal. Right Ventricle: The right ventricular size is mildly enlarged. No increase in right ventricular wall thickness. Right ventricular systolic function is normal. There is normal pulmonary artery systolic pressure. The tricuspid regurgitant velocity is 2.13  m/s, and with an assumed right atrial pressure of 8 mmHg, the estimated right ventricular systolic pressure is 01.7 mmHg. Left Atrium: Left atrial size was mildly dilated. Right Atrium: Right atrial size was normal in size. Pericardium: There is no evidence of pericardial effusion. Mitral Valve: The mitral valve is normal in structure. No evidence of mitral valve regurgitation. No evidence of mitral valve stenosis. Tricuspid Valve: The tricuspid valve is normal in structure. Tricuspid valve regurgitation is not demonstrated. No evidence of tricuspid stenosis. Aortic Valve: The aortic valve is normal in structure. Aortic valve regurgitation is not visualized. No aortic stenosis is present. Pulmonic Valve: The pulmonic valve was normal in structure. Pulmonic valve regurgitation is not visualized. No evidence of pulmonic stenosis. Aorta: The aortic root is normal in size and structure. Venous: The inferior vena cava is dilated in size with greater than 50% respiratory variability, suggesting right atrial pressure of 8 mmHg. IAS/Shunts: No atrial level shunt detected by color flow Doppler.  LEFT VENTRICLE PLAX 2D LVIDd:         5.23 cm  Diastology LVIDs:         3.35 cm  LV e' medial:    5.98 cm/s LV PW:         1.01 cm  LV E/e' medial:  14.4 LV IVS:        1.02 cm  LV e' lateral:   10.20 cm/s LVOT diam:     1.90 cm  LV E/e' lateral: 8.5 LV SV:         64 LV SV Index:   28 LVOT Area:     2.84 cm  RIGHT VENTRICLE             IVC RV Basal diam:  2.97 cm     IVC diam: 2.07 cm RV S prime:     12.00 cm/s TAPSE (M-mode): 2.1 cm LEFT ATRIUM             Index       RIGHT ATRIUM           Index LA diam:        4.50 cm 1.98 cm/m  RA Area:     18.20 cm  LA Vol (A2C):   61.4 ml 27.01 ml/m RA Volume:   52.20 ml  22.96 ml/m LA Vol (A4C):   70.6 ml 31.05 ml/m LA Biplane Vol: 68.4 ml 30.08 ml/m  AORTIC VALVE LVOT Vmax:   99.80 cm/s LVOT  Vmean:  67.800 cm/s LVOT VTI:    0.224 m  AORTA Ao Root diam: 3.10 cm Ao Asc diam:  3.10 cm MITRAL VALVE               TRICUSPID VALVE MV Area (PHT): 3.27 cm    TR Peak grad:   18.1 mmHg MV Decel Time: 232 msec    TR Vmax:        213.00 cm/s MV E velocity: 86.40 cm/s MV A velocity: 80.40 cm/s  SHUNTS MV E/A ratio:  1.07        Systemic VTI:  0.22 m                            Systemic Diam: 1.90 cm Dani Gobble Croitoru MD Electronically signed by Sanda Klein MD Signature Date/Time: 12/07/2019/3:44:13 PM    Final    CT IMAGE GUIDED DRAINAGE BY PERCUTANEOUS CATHETER  Result Date: 12/07/2019 CLINICAL DATA:  Large diverticular abscess of the colon within the pelvis. EXAM: CT GUIDED CATHETER DRAINAGE OF PERITONEAL ABSCESS ANESTHESIA/SEDATION: 1.0 mg IV Versed 50 mcg IV Fentanyl Total Moderate Sedation Time:  22 minutes The patient's level of consciousness and physiologic status were continuously monitored during the procedure by Radiology nursing. PROCEDURE: The procedure, risks, benefits, and alternatives were explained to the patient. Questions regarding the procedure were encouraged and answered. The patient understands and consents to the procedure. A time out was performed prior to initiating the procedure. CT was performed of the lower abdomen and pelvis in a supine position. The left lower abdominal wall was prepped with chlorhexidine in a sterile fashion, and a sterile drape was applied covering the operative field. A sterile gown and sterile gloves were used for the procedure. Local anesthesia was provided with 1% Lidocaine. An 18 gauge trocar needle was advanced under CT guidance to the level of the diverticular abscess. After return of air and fluid, a guidewire was advanced into the collection and the needle removed. The  percutaneous tract was dilated. A 12 French percutaneous drainage catheter was advanced into the abscess. Fluid sample was aspirated and sent for culture analysis. The drain was connected to a suction bulb. It was secured at the skin with a Prolene retention suture and StatLock device. COMPLICATIONS: None FINDINGS: Large unilocular diverticular abscess containing air-fluid level was localized. After needle access, there was return of foul-smelling, feculent fluid. After drain placement, there is rapid return brown feculent fluid. IMPRESSION: CT-guided percutaneous drainage of diverticular abscess. A 12 French drain was placed and attached to suction bulb drainage. A sample of feculent fluid was sent for culture analysis. Electronically Signed   By: Aletta Edouard M.D.   On: 12/07/2019 16:20    Labs:  CBC: Recent Labs    12/06/19 1944 12/07/19 0239 12/08/19 0549  WBC 31.5* 27.9* 39.5*  HGB 10.6* 10.1* 9.6*  HCT 34.4* 32.3* 31.2*  PLT 439* 356 455*    COAGS: Recent Labs    12/07/19 1158  INR 1.2    BMP: Recent Labs    12/07/19 0239 12/07/19 1829 12/08/19 0549 12/08/19 1105  NA 133* 136 136 137  K 5.2* 4.9 5.5* 5.2*  CL 99 102 104 104  CO2 19* 22 15* 20*  GLUCOSE 112* 132* 153* 173*  BUN 102* 93* 92* 87*  CALCIUM 10.7* 10.3 10.2 10.2  CREATININE 5.26* 4.74* 4.58* 4.40*  GFRNONAA 8* 10* 10* 10*    LIVER FUNCTION TESTS: Recent Labs  12/06/19 1944 12/08/19 0549  BILITOT 1.2 1.5*  AST 13* 11*  ALT 11 10  ALKPHOS 112 108  PROT 7.3 6.1*  ALBUMIN 2.3* 1.8*    Assessment and Plan:  Diverticular abscess Drain placement 10/26 in IR Will follow   Electronically Signed: Lavonia Drafts, PA-C 12/08/2019, 2:16 PM   I spent a total of 15 Minutes at the the patient's bedside AND on the patient's hospital floor or unit, greater than 50% of which was counseling/coordinating care for abscess drain

## 2019-12-08 NOTE — Progress Notes (Signed)
eLink Physician-Brief Progress Note Patient Name: Phyllis Todd DOB: Sep 10, 1950 MRN: [id number]   Date of Service  12/08/2019  HPI/Events of Note  Patient c/o back pain. AST and ALT are both normal.   eICU Interventions  Plan: 1. Tylenol 650 mg PO Q 6 hours PRN pain.      Intervention Category Major Interventions: Other:  Nekeisha Aure Cornelia Copa 12/08/2019, 6:54 AM

## 2019-12-08 NOTE — Progress Notes (Addendum)
12/08/2019  Attending Addendum  I saw and evaluated the patient. Discussed with resident and agree with resident's findings and plan as documented in the resident's note.  I have seen and evaluated the patient for septic shock.  S:  No events overnight.  Remains on fairly high-dose pressors.  Her abdominal pain is controlled with current regimen, abdominal abscess drain placed yesterday.  She is tolerating a clear liquid diet.  O: Blood pressure (!) 131/98, pulse 85, temperature 99.1 F (37.3 C), resp. rate (!) 27, height 5\' 4"  (1.626 m), weight 134.1 kg, SpO2 94 %.  No acute distress Abdomen is diffusely tender, hypoactive bowel sounds, JP drain in place with purulent output Mentating, moving all 4 extremities, no peripheral edema, heart sounds are regular, she has a slight holosystolic murmur over the apex, abdomen is soft There is some small skin breakdown over her groin  K remains elevated, creatinine remains elevated, calcium remains elevated, albumin 1.8, white count 40 from 28, platelets elevated  A:  Septic shock of GI source, intra-abdominal abscess, question contained perforation Acute on chronic renal injury, baseline CKD 3B Class III obesity Nonischemic cardiomyopathy Nonalcoholic fatty liver disease Diastolic heart dysfunction  P:  - Drain management per surgery and IR - Hydrocortisone will be discontinued in case patient has to go for definitive surgery - Continue broad-spectrum antibiotics including antifungals, await culture maturity from abscess - Continue vasopressin and norepinephrine, titrate down as tolerated - Strict ins and outs, avoid nephrotoxins - Going back to ice chips in case needs surgery tomorrow, okay for heparin subcutaneous today but would hold at midnight in case needs to go to OR in morning. - Appreciate close general surgery follow-up - Guarded prognosis    Patient critically ill due to septic shock from GI source, acute on chronic renal  failure Interventions to address this today pressor titration Risk of deterioration without these interventions is high  I personally spent 39 minutes providing critical care not including any separately billable procedures  Erskine Emery MD [facility] 12/08/2019 10:27 AM Personal pager: [phone] If unanswered, please page CCM On-call: [phone]      NAME:  Phyllis Todd, MRN:  [id number], DOB:  [birth year], LOS: 1 ADMISSION DATE:  12/06/2019, CONSULTATION DATE: 12/07/2019 REFERRING MD: Ottis Stain, CHIEF COMPLAINT: Left lower quadrant abdominal pain  Brief History   69 year old with 1 week history of left lower quadrant abdominal pain  History of present illness   Patient is a 69 year old female, morbidly obese BMI of 57 presents to the emergency room with a 1 week history of left lower quadrant abdominal pain.  With this she has associated anorexia with poor oral intake of solids and fluids, last bowel movement was 3 days ago.  She has a prior history of systolic and diastolic heart failure with an ejection fraction of 35 to 40% although on reevaluation October 2019 her ejection fraction was up to 55 to 60%.  She also has a history of [place]. Patient has been hypotensive in the emergency room currently receiving her third liter of IV fluid.  Blood pressure is running about 100/60.  Blood cultures have been planted.  CT scan of the abdomen shows intra-abdominal abscess measuring 11.0 x 5.9 x 9.3 cm.  Labs are pertinent for a white count of 31,000, hemoglobin of 10, platelet count 439.  Creatinine is 5.75 with a baseline of 1.6.  Anion gap is 18.  Lactic acid is pending. Surgery is considering adequate drainage of the abscess versus IR  placing a drain.  Past Medical History   . Acute combined systolic and diastolic heart failure ([place]) 06/2013   Class 3 - ECHO EF 35-40%  . Acute renal failure ([place])   . BMI 60.0-69.9, adult ([place])   . CHF (congestive heart failure) ([place])  06/2013  . Hypertension   . Morbid obesity ([place])   . NICM (nonischemic cardiomyopathy) ([place]) 06/2013  . Obstructive sleep apnea (adult) (pediatric) 06/2013  . Other ascites 06/2013  . Other chronic nonalcoholic liver disease 02/6107   Significant Hospital Events   10/25 >> Admitted to [facility] 10/26 >> Drain placed into abscess, increased pressor needs  Consults:  Surgery, IR  Procedures:  10/26 >> CT-guided drain placement   Significant Diagnostic Tests:  CT A/P (10/25) >> Findings suggestive of extensive mid ileal enteritis and possible sigmoid colonic diverticulitis. There is an adjacent multilocular fluid collection consistent with an intra-abdominal abscess measuring 11.0 x 5.9 x 9.3 cm. Small amount of pneumoperitoneum  Micro Data:  Blood cultures (10/25) >> Pending  Abscess cultures (10/26) >> GNR, GPC, GPR, culture pending   Antimicrobials:  Cefepime 10/25 Metronidazole 10/25  Fluconazole 10/25 >> Zosyn 10/25 >>  Interim history/subjective:   Yesterday afternoon, patient had CT guided drain placed into abscess. Overnight, patient has continued increases in pressor requirement to maintain MAPS.   Objective   Blood pressure (!) 131/98, pulse 85, temperature 99.1 F (37.3 C), resp. rate (!) 27, height 5\' 4"  (1.626 m), weight 134.1 kg, SpO2 94 %.        Intake/Output Summary (Last 24 hours) at 12/08/2019 0723 Last data filed at 12/08/2019 0600 Gross per 24 hour  Intake 3617 ml  Output 2420 ml  Net 1197 ml   Filed Weights   12/06/19 1919 12/07/19 0136 12/08/19 0500  Weight: (!) 152 kg 129.2 kg 134.1 kg   Examination: General: Obese white female in no acute distress HENT: Normal Lungs: Clear to auscultation bilaterally.  Cardiovascular: Regular rate and rhythm, 2/6 systolic murmur Abdomen: No tenderness. No distension. No bowel sounds today.  Neuro: Awake alert nonfocal GU: N/A  Resolved Hospital Problem list   NA  Assessment & Plan:   # Septic Shock #  Sigmoid Abdominal Abscess - Secondary to diverticulosis  - s/p fluid resuscitation  - s/p CT-guided drain placement on 10/26. - So far, 440 mL drained.  - Abscess cultures pending - gram stain shows gram positive cocci, gram negative rods and gram positive rods.  - Blood cultures NGTD at 1 day - Titrate Levophed and Vasopressin to maintain MAPs> 65  - Started Hydrocortisone q6h yesterday  - Continue Zosyn and Fluconazole  - Dilaudid PRN for pain  # Acute Kidney Injury - in light of dehydration and sepsis. Slight improvement. Good UOP. # Hx of CKD3 - Baseline creatinine: ~ 1.6 - UA without evidence of ATN. BUN/Cr ratio of 20 supporting pre-renal cause.  - S/p 4.5 liter bolus of LR without significant improvement - Will continue maintenance fluids - Mildly hyperkalemia, will give 1 time dose of Lokelma given patient is having frequent PVCs - Avoid nephrotoxic agents   # Anion Gap Metabolic Acidosis - New overnight - Lactic acid pending   # HFrEF with recovered EF # Nonischemic Cardiomyopathy  # Hypertension  - Last TTE with EF of 55-60% in 2019, G2DD. TTE repeated yesterday and essentially unchanged.  - Will monitor closely for pulmonary edema with fluid resuscitation - Hold home medications due to hypotension and AKI (Coreg, Losartan, and Spironolactone)   #  Pulmonary hypertension  - Secondary to obstructive sleep apnea and chronic congestive heart failure  # NAFLD - No follow up with GI in chart  - Unknown if progressed to cirrhosis   Best practice:  Diet: N.p.o. Pain/Anxiety/Delirium protocol (if indicated): Pain medication as needed VAP protocol (if indicated): N/A DVT prophylaxis: We will hold anticoagulation for now.  Waiting IR surgical intervention. GI prophylaxis: N/A Glucose control: Monitor Mobility: Bedrest Code Status: Full Family Communication: Patient updated  Disposition: ICU  Labs   CBC: Recent Labs  Lab 12/06/19 1944 12/07/19 0239 12/08/19 0549    WBC 31.5* 27.9* PENDING  NEUTROABS 26.5*  --  PENDING  HGB 10.6* 10.1* 9.6*  HCT 34.4* 32.3* 31.2*  MCV 104.9* 102.9* 105.4*  PLT 439* 356 455*    Basic Metabolic Panel: Recent Labs  Lab 12/06/19 1944 12/07/19 0239 12/07/19 1829 12/08/19 0549  NA 134* 133* 136 136  K 5.5* 5.2* 4.9 5.5*  CL 94* 99 102 104  CO2 22 19* 22 15*  GLUCOSE 126* 112* 132* 153*  BUN 105* 102* 93* 92*  CREATININE 5.75* 5.26* 4.74* 4.58*  CALCIUM 11.5* 10.7* 10.3 10.2  MG  --  1.9 1.9  --   PHOS  --  3.9 4.5  --    GFR: Estimated Creatinine Clearance: 16.3 mL/min (A) (by C-G formula based on SCr of 4.58 mg/dL (H)). Recent Labs  Lab 12/06/19 1944 12/07/19 0239 12/08/19 0549  WBC 31.5* 27.9* PENDING  LATICACIDVEN  --  1.4  --     Liver Function Tests: Recent Labs  Lab 12/06/19 1944 12/08/19 0549  AST 13* 11*  ALT 11 10  ALKPHOS 112 108  BILITOT 1.2 1.5*  PROT 7.3 6.1*  ALBUMIN 2.3* 1.8*   Recent Labs  Lab 12/06/19 1944  LIPASE 28   No results for input(s): AMMONIA in the last 168 hours.  ABG    Component Value Date/Time   PHART 7.389 07/12/2013 1438   PCO2ART 50.6 (H) 07/12/2013 1438   PO2ART 51.0 (L) 07/12/2013 1438   HCO3 30.5 (H) 07/12/2013 1438   TCO2 32 07/12/2013 1438   O2SAT 85.0 07/12/2013 1438     Coagulation Profile: Recent Labs  Lab 12/07/19 1158  INR 1.2    Cardiac Enzymes: No results for input(s): CKTOTAL, CKMB, CKMBINDEX, TROPONINI in the last 168 hours.  HbA1C: No results found for: HGBA1C  CBG: Recent Labs  Lab 12/07/19 0128  GLUCAP 108*    Review of Systems:   Other than nausea mild vomiting negative on 14 point review of systems other than that mentioned in HPI.  Past Medical History  She,  has a past medical history of Acute combined systolic and diastolic heart failure ([employer]) (06/2013), Acute renal failure ([place]), BMI 60.0-69.9, adult ([place]), CHF (congestive heart failure) ([place]) (06/2013), Hypertension, Morbid obesity ([place]), NICM  (nonischemic cardiomyopathy) ([place]) (06/2013), Obstructive sleep apnea (adult) (pediatric) (06/2013), Other ascites (06/2013), and Other chronic nonalcoholic liver disease (05/4008).   Surgical History    Past Surgical History:  Procedure Laterality Date  . CARDIAC CATHETERIZATION  2015  . CESAREAN SECTION    . DILATION AND CURETTAGE OF UTERUS  1984  . LEFT HEART CATHETERIZATION WITH CORONARY ANGIOGRAM N/A 07/12/2013   Procedure: LEFT HEART CATHETERIZATION WITH CORONARY ANGIOGRAM;  Surgeon: Troy Sine, MD;  Location[facility] CATH LAB;  Service: Cardiovascular;  Laterality: N/A;  . TONSILLECTOMY       Social History   reports that she has never smoked. She has  never used smokeless tobacco. She reports current alcohol use. She reports that she does not use drugs.   Family History   Her family history includes Breast cancer in her maternal grandmother; CAD in her father; Diabetes in her brother; Heart failure in her mother; Hypertension in her brother, father, and mother; Stroke in her mother; Stroke (age of onset: 64) in her brother.   Allergies Allergies  Allergen Reactions  . Codeine Other (See Comments)    "makes me hyper"  . Demerol [Meperidine] Other (See Comments)    "sick"  . Lisinopril Cough  . Peanut-Containing Drug Products Hives and Swelling    "almonds"     Home Medications  Prior to Admission medications   Medication Sig Start Date End Date Taking? Authorizing Provider  acetaminophen (TYLENOL) 500 MG tablet Take 500 mg by mouth every 6 (six) hours as needed (pain).   Yes [provider]  aspirin EC 81 MG EC tablet Take 1 tablet (81 mg total) by mouth daily. 07/13/13  Yes Charlynne Cousins, MD  carvedilol (COREG) 25 MG tablet TAKE (1) TABLET TWICE A DAY WITH FOOD---BREAKFAST AND SUPPER. Patient taking differently: Take 25 mg by mouth 2 (two) times daily with a meal.  02/24/19  Yes Larey Dresser, MD  furosemide (LASIX) 40 MG tablet Take 1 tablet (40 mg total) by  mouth daily. Must have office visit please call [phone] 10/25/19  Yes Larey Dresser, MD  losartan (COZAAR) 50 MG tablet TAKE 1 TABLET BY MOUTH TWICE DAILY 11/30/19  Yes Larey Dresser, MD  spironolactone (ALDACTONE) 25 MG tablet TAKE 1 TABLET BY MOUTH ONCE DAILY. 11/30/19  Yes Larey Dresser, MD     Dr. Jose Persia Internal Medicine PGY-2   12/08/2019, 7:23 AM

## 2019-12-09 DIAGNOSIS — K651 Peritoneal abscess: Secondary | ICD-10-CM | POA: Diagnosis not present

## 2019-12-09 LAB — GLUCOSE, CAPILLARY
Glucose-Capillary: 119 mg/dL — ABNORMAL HIGH (ref 70–99)
Glucose-Capillary: 125 mg/dL — ABNORMAL HIGH (ref 70–99)
Glucose-Capillary: 126 mg/dL — ABNORMAL HIGH (ref 70–99)
Glucose-Capillary: 69 mg/dL — ABNORMAL LOW (ref 70–99)
Glucose-Capillary: 70 mg/dL (ref 70–99)
Glucose-Capillary: 81 mg/dL (ref 70–99)
Glucose-Capillary: 86 mg/dL (ref 70–99)
Glucose-Capillary: 89 mg/dL (ref 70–99)

## 2019-12-09 LAB — COMPREHENSIVE METABOLIC PANEL
ALT: 12 U/L (ref 0–44)
AST: 10 U/L — ABNORMAL LOW (ref 15–41)
Albumin: 1.8 g/dL — ABNORMAL LOW (ref 3.5–5.0)
Alkaline Phosphatase: 96 U/L (ref 38–126)
Anion gap: 10 (ref 5–15)
BUN: 82 mg/dL — ABNORMAL HIGH (ref 8–23)
CO2: 21 mmol/L — ABNORMAL LOW (ref 22–32)
Calcium: 10.5 mg/dL — ABNORMAL HIGH (ref 8.9–10.3)
Chloride: 107 mmol/L (ref 98–111)
Creatinine, Ser: 3.94 mg/dL — ABNORMAL HIGH (ref 0.44–1.00)
GFR, Estimated: 12 mL/min — ABNORMAL LOW (ref >60)
Glucose, Bld: 144 mg/dL — ABNORMAL HIGH (ref 70–99)
Potassium: 4.6 mmol/L (ref 3.5–5.1)
Sodium: 138 mmol/L (ref 135–145)
Total Bilirubin: 1 mg/dL (ref 0.3–1.2)
Total Protein: 6.1 g/dL — ABNORMAL LOW (ref 6.5–8.1)

## 2019-12-09 LAB — MRSA PCR SCREENING: MRSA by PCR: NEGATIVE

## 2019-12-09 LAB — MAGNESIUM: Magnesium: 2 mg/dL (ref 1.7–2.4)

## 2019-12-09 LAB — CBC WITH DIFFERENTIAL/PLATELET
Abs Immature Granulocytes: 1.57 10*3/uL — ABNORMAL HIGH (ref 0.00–0.07)
Basophils Absolute: 0.1 10*3/uL (ref 0.0–0.1)
Basophils Relative: 0 %
Eosinophils Absolute: 0.1 10*3/uL (ref 0.0–0.5)
Eosinophils Relative: 0 %
HCT: 29.9 % — ABNORMAL LOW (ref 36.0–46.0)
Hemoglobin: 9.1 g/dL — ABNORMAL LOW (ref 12.0–15.0)
Immature Granulocytes: 5 %
Lymphocytes Relative: 5 %
Lymphs Abs: 1.7 10*3/uL (ref 0.7–4.0)
MCH: 31.9 pg (ref 26.0–34.0)
MCHC: 30.4 g/dL (ref 30.0–36.0)
MCV: 104.9 fL — ABNORMAL HIGH (ref 80.0–100.0)
Monocytes Absolute: 1.5 10*3/uL — ABNORMAL HIGH (ref 0.1–1.0)
Monocytes Relative: 5 %
Neutro Abs: 29.4 10*3/uL — ABNORMAL HIGH (ref 1.7–7.7)
Neutrophils Relative %: 85 %
Platelets: 424 10*3/uL — ABNORMAL HIGH (ref 150–400)
RBC: 2.85 MIL/uL — ABNORMAL LOW (ref 3.87–5.11)
RDW: 13.6 % (ref 11.5–15.5)
WBC: 34.4 10*3/uL — ABNORMAL HIGH (ref 4.0–10.5)
nRBC: 0 % (ref 0.0–0.2)

## 2019-12-09 LAB — HEMOGLOBIN A1C
Hgb A1c MFr Bld: 5.5 % (ref 4.8–5.6)
Mean Plasma Glucose: 111 mg/dL

## 2019-12-09 MED ORDER — HEPARIN SODIUM (PORCINE) 5000 UNIT/ML IJ SOLN
5000.0000 [IU] | Freq: Three times a day (TID) | INTRAMUSCULAR | Status: AC
  Administered 2019-12-09 – 2019-12-10 (×3): 5000 [IU] via SUBCUTANEOUS
  Filled 2019-12-09 (×3): qty 1

## 2019-12-09 MED ORDER — DEXTROSE 50 % IV SOLN
INTRAVENOUS | Status: AC
  Filled 2019-12-09: qty 50

## 2019-12-09 MED ORDER — GUAIFENESIN ER 600 MG PO TB12
600.0000 mg | ORAL_TABLET | Freq: Two times a day (BID) | ORAL | Status: DC
  Administered 2019-12-09 – 2019-12-17 (×17): 600 mg via ORAL
  Filled 2019-12-09 (×18): qty 1

## 2019-12-09 MED ORDER — DEXTROSE 50 % IV SOLN
INTRAVENOUS | Status: AC
  Administered 2019-12-09: 12.5 g via INTRAVENOUS
  Filled 2019-12-09: qty 50

## 2019-12-09 MED ORDER — DEXTROSE 50 % IV SOLN: 12.5000 g | INTRAVENOUS | Status: AC

## 2019-12-09 NOTE — Progress Notes (Signed)
[facility] Progress Note     Subjective: CC:  States she feels about the same as yesterday - has aching LLQ pain worse with coughing,moving. One episode nausea this AM that was transient, no vomiting. No more BMs overnight. Reports post-nasal drip and trouble clearing secretions.  Afebrile, BP 129/58 on 9u vaso, 30 mcg levo WBC 34 from 39  Objective: Vital signs in last 24 hours: Temp:  [98.2 F (36.8 C)-99.5 F (37.5 C)] 98.6 F (37 C) (10/28 0600) Pulse Rate:  [55-97] 70 (10/28 0600) Resp:  [11-26] 16 (10/28 0600) BP: (87-133)/(42-101) 129/73 (10/28 0600) SpO2:  [87 %-98 %] 97 % (10/28 0600) Last BM Date: 12/08/19  Intake/Output from previous day: 10/27 0701 - 10/28 0700 In: 1434.4 [I.V.:1119.4; IV Piggyback:310] Out: 1540 [BWIOM:3559; Drains:65] Intake/Output this shift: No intake/output data recorded.  PE:  Gen:  Alert, NAD, non-toxic appearing female who appears states age Card:  Regular rate and rhythm, heart murmur present Pulm:  Normal effort on nasal cannula, clear to auscultation bilaterally Abd: Soft, obese, JP in place with purulent drainage and air in bulb(65 cc/24h),hypoactive BS Skin: warm and dry, skin breakdown inframammary fold and groin GU: foley in place, 1475 cc urine/24h Psych: A&Ox3   Lab Results:  Recent Labs    12/08/19 0549 12/09/19 0220  WBC 39.5* 34.4*  HGB 9.6* 9.1*  HCT 31.2* 29.9*  PLT 455* 424*   BMET Recent Labs    12/08/19 1105 12/09/19 0220  NA 137 138  K 5.2* 4.6  CL 104 107  CO2 20* 21*  GLUCOSE 173* 144*  BUN 87* 82*  CREATININE 4.40* 3.94*  CALCIUM 10.2 10.5*   PT/INR Recent Labs    12/07/19 1158  LABPROT 15.2  INR 1.2   CMP     Component Value Date/Time   NA 138 12/09/2019 0220   NA 140 01/14/2014 0921   K 4.6 12/09/2019 0220   CL 107 12/09/2019 0220   CO2 21 (L) 12/09/2019 0220   GLUCOSE 144 (H) 12/09/2019 0220   BUN 82 (H) 12/09/2019 0220   BUN 29 (H) 01/14/2014 0921   CREATININE  3.94 (H) 12/09/2019 0220   CALCIUM 10.5 (H) 12/09/2019 0220   PROT 6.1 (L) 12/09/2019 0220   ALBUMIN 1.8 (L) 12/09/2019 0220   AST 10 (L) 12/09/2019 0220   ALT 12 12/09/2019 0220   ALKPHOS 96 12/09/2019 0220   BILITOT 1.0 12/09/2019 0220   GFRNONAA 12 (L) 12/09/2019 0220   GFRAA 38 (L) 03/13/2018 1053   Lipase     Component Value Date/Time   LIPASE 28 12/06/2019 1944       Studies/Results: DG CHEST PORT 1 VIEW  Result Date: 12/07/2019 CLINICAL DATA:  Central venous catheter placement EXAM: PORTABLE CHEST 1 VIEW COMPARISON:  December 06, 2019 FINDINGS: Central catheter tip is in the superior vena cava. No pneumothorax. Lungs are clear. Heart is slightly enlarged with pulmonary vascularity normal. No adenopathy. No bone lesions. IMPRESSION: Central catheter tip in superior vena cava. No pneumothorax. Lungs clear. Stable cardiac prominence. Electronically Signed   By: Lowella Grip III M.D.   On: 12/07/2019 11:41   ECHOCARDIOGRAM COMPLETE  Result Date: 12/07/2019    ECHOCARDIOGRAM REPORT   Patient Name:   Phyllis Todd Date of Exam: 12/07/2019 Medical Rec #:  [id number]   Height:       64.0 in Accession #:    [id number]  Weight:       284.8 lb Date of Birth:  08-09-1950  BSA:          2.274 m Patient Age:    69 years    BP:           95/54 mmHg Patient Gender: F           HR:           79 bpm. Exam Location:  Inpatient Procedure: 2D Echo, Cardiac Doppler, Color Doppler and Intracardiac            Opacification Agent Indications:    X91.47 Chronic systolic (congestive) heart failure  History:        Patient has prior history of Echocardiogram examinations, most                 recent 11/24/2017. Cardiomyopathy; Risk Factors:Hypertension.                 Renal Failure.  Sonographer:    Jonelle Sidle Dance Referring Phys: [id number] [place]  1. Left ventricular ejection fraction, by estimation, is 55 to 60%. The left ventricle has normal function. The left ventricle has no regional  wall motion abnormalities. Left ventricular diastolic parameters were normal.  2. Right ventricular systolic function is normal. The right ventricular size is mildly enlarged. There is normal pulmonary artery systolic pressure.  3. Left atrial size was mildly dilated.  4. The mitral valve is normal in structure. No evidence of mitral valve regurgitation. No evidence of mitral stenosis.  5. The aortic valve is normal in structure. Aortic valve regurgitation is not visualized. No aortic stenosis is present.  6. The inferior vena cava is dilated in size with >50% respiratory variability, suggesting right atrial pressure of 8 mmHg. Comparison(s): Prior images reviewed side by side. The left ventricular function is unchanged. FINDINGS  Left Ventricle: Left ventricular ejection fraction, by estimation, is 55 to 60%. The left ventricle has normal function. The left ventricle has no regional wall motion abnormalities. Definity contrast agent was given IV to delineate the left ventricular  endocardial borders. The left ventricular internal cavity size was normal in size. There is no left ventricular hypertrophy. Left ventricular diastolic parameters were normal. Right Ventricle: The right ventricular size is mildly enlarged. No increase in right ventricular wall thickness. Right ventricular systolic function is normal. There is normal pulmonary artery systolic pressure. The tricuspid regurgitant velocity is 2.13  m/s, and with an assumed right atrial pressure of 8 mmHg, the estimated right ventricular systolic pressure is 30.8 mmHg. Left Atrium: Left atrial size was mildly dilated. Right Atrium: Right atrial size was normal in size. Pericardium: There is no evidence of pericardial effusion. Mitral Valve: The mitral valve is normal in structure. No evidence of mitral valve regurgitation. No evidence of mitral valve stenosis. Tricuspid Valve: The tricuspid valve is normal in structure. Tricuspid valve regurgitation is not  demonstrated. No evidence of tricuspid stenosis. Aortic Valve: The aortic valve is normal in structure. Aortic valve regurgitation is not visualized. No aortic stenosis is present. Pulmonic Valve: The pulmonic valve was normal in structure. Pulmonic valve regurgitation is not visualized. No evidence of pulmonic stenosis. Aorta: The aortic root is normal in size and structure. Venous: The inferior vena cava is dilated in size with greater than 50% respiratory variability, suggesting right atrial pressure of 8 mmHg. IAS/Shunts: No atrial level shunt detected by color flow Doppler.  LEFT VENTRICLE PLAX 2D LVIDd:         5.23 cm  Diastology LVIDs:  3.35 cm  LV e' medial:    5.98 cm/s LV PW:         1.01 cm  LV E/e' medial:  14.4 LV IVS:        1.02 cm  LV e' lateral:   10.20 cm/s LVOT diam:     1.90 cm  LV E/e' lateral: 8.5 LV SV:         64 LV SV Index:   28 LVOT Area:     2.84 cm  RIGHT VENTRICLE             IVC RV Basal diam:  2.97 cm     IVC diam: 2.07 cm RV S prime:     12.00 cm/s TAPSE (M-mode): 2.1 cm LEFT ATRIUM             Index       RIGHT ATRIUM           Index LA diam:        4.50 cm 1.98 cm/m  RA Area:     18.20 cm LA Vol (A2C):   61.4 ml 27.01 ml/m RA Volume:   52.20 ml  22.96 ml/m LA Vol (A4C):   70.6 ml 31.05 ml/m LA Biplane Vol: 68.4 ml 30.08 ml/m  AORTIC VALVE LVOT Vmax:   99.80 cm/s LVOT Vmean:  67.800 cm/s LVOT VTI:    0.224 m  AORTA Ao Root diam: 3.10 cm Ao Asc diam:  3.10 cm MITRAL VALVE               TRICUSPID VALVE MV Area (PHT): 3.27 cm    TR Peak grad:   18.1 mmHg MV Decel Time: 232 msec    TR Vmax:        213.00 cm/s MV E velocity: 86.40 cm/s MV A velocity: 80.40 cm/s  SHUNTS MV E/A ratio:  1.07        Systemic VTI:  0.22 m                            Systemic Diam: 1.90 cm Dani Gobble Croitoru MD Electronically signed by Sanda Klein MD Signature Date/Time: 12/07/2019/3:44:13 PM    Final    CT IMAGE GUIDED DRAINAGE BY PERCUTANEOUS CATHETER  Result Date: 12/07/2019 CLINICAL  DATA:  Large diverticular abscess of the colon within the pelvis. EXAM: CT GUIDED CATHETER DRAINAGE OF PERITONEAL ABSCESS ANESTHESIA/SEDATION: 1.0 mg IV Versed 50 mcg IV Fentanyl Total Moderate Sedation Time:  22 minutes The patient's level of consciousness and physiologic status were continuously monitored during the procedure by Radiology nursing. PROCEDURE: The procedure, risks, benefits, and alternatives were explained to the patient. Questions regarding the procedure were encouraged and answered. The patient understands and consents to the procedure. A time out was performed prior to initiating the procedure. CT was performed of the lower abdomen and pelvis in a supine position. The left lower abdominal wall was prepped with chlorhexidine in a sterile fashion, and a sterile drape was applied covering the operative field. A sterile gown and sterile gloves were used for the procedure. Local anesthesia was provided with 1% Lidocaine. An 18 gauge trocar needle was advanced under CT guidance to the level of the diverticular abscess. After return of air and fluid, a guidewire was advanced into the collection and the needle removed. The percutaneous tract was dilated. A 12 French percutaneous drainage catheter was advanced into the abscess. Fluid sample was aspirated and sent for culture  analysis. The drain was connected to a suction bulb. It was secured at the skin with a Prolene retention suture and StatLock device. COMPLICATIONS: None FINDINGS: Large unilocular diverticular abscess containing air-fluid level was localized. After needle access, there was return of foul-smelling, feculent fluid. After drain placement, there is rapid return brown feculent fluid. IMPRESSION: CT-guided percutaneous drainage of diverticular abscess. A 12 French drain was placed and attached to suction bulb drainage. A sample of feculent fluid was sent for culture analysis. Electronically Signed   By: Aletta Edouard M.D.   On: 12/07/2019  16:20    Anti-infectives: Anti-infectives (From admission, onward)   Start     Dose/Rate Route Frequency Ordered Stop   12/09/19 1000  anidulafungin (ERAXIS) 100 mg in sodium chloride 0.9 % 100 mL IVPB        100 mg 78 mL/hr over 100 Minutes Intravenous Every 24 hours 12/08/19 1050     12/08/19 1145  anidulafungin (ERAXIS) 200 mg in sodium chloride 0.9 % 200 mL IVPB        200 mg 78 mL/hr over 200 Minutes Intravenous  Once 12/08/19 1050 12/08/19 1633   12/07/19 0600  metroNIDAZOLE (FLAGYL) IVPB 500 mg  Status:  Discontinued        500 mg 100 mL/hr over 60 Minutes Intravenous Every 8 hours 12/07/19 0030 12/07/19 0122   12/07/19 0600  piperacillin-tazobactam (ZOSYN) IVPB 2.25 g        2.25 g 100 mL/hr over 30 Minutes Intravenous Every 8 hours 12/07/19 0122     12/07/19 0130  fluconazole (DIFLUCAN) IVPB 200 mg  Status:  Discontinued        200 mg 100 mL/hr over 60 Minutes Intravenous Every 24 hours 12/07/19 0030 12/08/19 1050   12/06/19 2200  ceFEPIme (MAXIPIME) 2 g in sodium chloride 0.9 % 100 mL IVPB        2 g 200 mL/hr over 30 Minutes Intravenous  Once 12/06/19 2146 12/06/19 2330   12/06/19 2200  metroNIDAZOLE (FLAGYL) IVPB 500 mg        500 mg 100 mL/hr over 60 Minutes Intravenous  Once 12/06/19 2146 12/06/19 2358     Assessment/Plan Morbid obesity 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 GS w/ GNR, GPC, GPR; Cx w. Streptococcus anginosis, follow cultures/sensitivites; monitor output  - afebrile, WBC 34 from 39 - continue NPO, IVF, bowel  rest today  - remains on vaso/levo; she is only slightly better today, not worse. Would continue to monitor drain output and vitals today. Failure to improve will warrant urgent exploratory laparotomy.  - In the setting of multiple above medical comorbidities perioperative morbidity and mortality are high. According to NSQIP patient has a 34.8% likelihood of serious perioperative complication, 6.2% risk of cardiac complication, and 94.7% chance of perioperative mortality. I discussed the possibility of surgery with the patient if non-operative measures are unsuccessful or her condition worsens and she states if she was going to die without an operation she would want to go to surgery.  - Greatly  appreciate CCM's care of this patient    FEN: NPO, IVF ID: Zosyn, fluconazole 10/25 >>  VTE: SCD's, SQH Foley: in place for strict I&Os Dispo: ICU  LOS: 2 days    Obie Dredge, [facility] Please see Amion for pager number during day hours 7:00am-4:30pm

## 2019-12-09 NOTE — Progress Notes (Addendum)
12/09/2019 I saw and evaluated the patient. Discussed with resident and agree with resident's findings and plan as documented in the resident's note.  I have seen and evaluated the patient in follow-up for septic shock.  S:  No events overnight, pressor requirements improving.  She states she feels better with more energy.  She is hungry  O: Blood pressure 129/73, pulse 70, temperature 98.6 F (37 C), resp. rate 16, height 5\' 4"  (1.626 m), weight 134.1 kg, SpO2 97 %.  Patient in no acute distress Abdomen is soft, JP drain with stool appearing output minimal Lung sounds are clear, no accessory muscle use Heart sounds regular, extremities are warm She is mentating and moving all 4 extremities She has good insight Creatinine slightly improved as is BUN, albumin 1.8 White count improved from 39-34 Hemoglobin stable Stable mild thrombocytosis  Blood sugars at goal She was actually net even yesterday with a net +2 L for admission, 1500 cc urine output  A:  Septic shock secondary to abdominal source with intra-abdominal abscess and a question of a contained perforation from diverticulitis. 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 Chronic hypercalcemia  P:  Drain management per surgery and IR Continue to avoid nephrotoxins and hopeful continued renal improvement Continue Eraxis and Zosyn, would put end date on the Eraxis of 7 days Continue ice chips for now, hopeful she does not need exploratory laparotomy as she has high peri and postoperative mortality and morbidity risk Continue to wean vasopressors to maintain MAP greater than 65 Appreciate continued close general surgery follow-up  Patient critically ill due to septic shock from GI source, acute on chronic renal failure Interventions to address this today pressor titration Risk of  deterioration without these interventions is high  I personally spent 36 minutes providing critical care not including any separately billable procedures  Erskine Emery MD [facility] 12/08/2019 10:27 AM Personal pager: [phone] If unanswered, please page CCM On-call: [phone]     NAME:  Octivia Canion, MRN:  [id number], DOB:  [birth year], LOS: 2 ADMISSION DATE:  12/06/2019, CONSULTATION DATE: 12/07/2019 REFERRING MD: Ottis Stain, CHIEF COMPLAINT: Left lower quadrant abdominal pain  Brief History   69 year old with 1 week history of left lower quadrant abdominal pain  History of present illness   Patient is a 69 year old female, morbidly obese BMI of 57 presents to the emergency room with a 1 week history of left lower quadrant abdominal pain.  With this she has associated anorexia with poor oral intake of solids and fluids, last bowel movement was 3 days ago.  She has a prior history of systolic and diastolic heart failure with an ejection fraction of 35 to 40% although on reevaluation October 2019 her ejection fraction was up to 55 to 60%.  She also has a history of [place].  Patient has been hypotensive in the emergency room currently receiving her third liter of IV fluid.  Blood pressure is running about 100/60.  Blood cultures have been planted.  CT scan of the abdomen shows intra-abdominal abscess measuring 11.0 x 5.9 x 9.3 cm.  Labs are pertinent for a white count of 31,000, hemoglobin of 10, platelet count 439.  Creatinine is 5.75 with a baseline of 1.6.  Anion gap is 18.  Lactic acid is pending. Surgery is considering adequate drainage of the abscess versus IR placing a drain.  Past Medical History   .  Acute combined systolic and diastolic heart failure ([place]) 06/2013   Class 3 - ECHO EF 35-40%  . Acute renal failure ([place])   . BMI 60.0-69.9, adult ([place])   . CHF (congestive heart failure) ([place]) 06/2013  . Hypertension   . Morbid obesity ([place])   . NICM  (nonischemic cardiomyopathy) ([place]) 06/2013  . Obstructive sleep apnea (adult) (pediatric) 06/2013  . Other ascites 06/2013  . Other chronic nonalcoholic liver disease 09/4694   Significant Hospital Events   10/25 >> Ad[facility] 10/26 >> Drain placed into abscess, increased pressor needs  Consults:  Surgery, IR  Procedures:  10/26 >> CT-guided drain placement   Significant Diagnostic Tests:  CT A/P (10/25) >> Findings suggestive of extensive mid ileal enteritis and possible sigmoid colonic diverticulitis. There is an adjacent multilocular fluid collection consistent with an intra-abdominal abscess measuring 11.0 x 5.9 x 9.3 cm. Small amount of pneumoperitoneum  Micro Data:  Blood cultures (10/25) >> Pending  Abscess cultures (10/26) >> Gram negative rods and strep anginosis   Antimicrobials:  Cefepime 10/25 Metronidazole 10/25  Fluconazole 10/25 - 10/27 Zosyn 10/25 >> Eraxis 10/27 >>  Interim history/subjective:   No overnight events. Remains on pressor support. Ms. Deblasi states she feels better this morning but is hungry.   Objective   Blood pressure 129/73, pulse 70, temperature 98.6 F (37 C), resp. rate 16, height 5\' 4"  (1.626 m), weight 134.1 kg, SpO2 97 %.        Intake/Output Summary (Last 24 hours) at 12/09/2019 0811 Last data filed at 12/09/2019 0600 Gross per 24 hour  Intake 1365.41 ml  Output 1390 ml  Net -24.59 ml   Filed Weights   12/06/19 1919 12/07/19 0136 12/08/19 0500  Weight: (!) 152 kg 129.2 kg 134.1 kg   Examination: General: Obese white female in no acute distress HENT: Normal Lungs: Clear to auscultation bilaterally.  Cardiovascular: Regular rate and rhythm, 2/6 systolic murmur Abdomen: No tenderness. No distension. No bowel sounds today.  Neuro: Awake alert nonfocal GU: N/A  Resolved Hospital Problem list   NA  Assessment & Plan:   # Septic Shock # Sigmoid Abdominal Abscess - Secondary to diverticulosis  - s/p fluid  resuscitation  - s/p CT-guided drain placement on 10/26. - So far, 505 mL drained.  - Abscess cultures show gram negative rods and Strep Anginosis  - Blood cultures NGTD at 2 day - Titrate Levophed and Vasopressin to maintain MAPs> 65; pressor requirement stable over the last 24 hours but still no improvement either  - Holding Hydrocortisone at this time in case surgery is required; risk of wound healing difficulties with steroid therapy.  - Surgery holding off on additional intervention at this time  - Continue Zosyn and Eraxis  - Dilaudid PRN for pain  # Acute Kidney Injury - in light of dehydration and sepsis. Continues to improve. Great UOP. # Hx of CKD3 - Baseline creatinine: ~ 1.6 - UA without evidence of ATN. BUN/Cr ratio of 20 supporting pre-renal cause.  - S/p 4.5 liter bolus of LR without significant improvement - Hyperkalemia improved s/p 3 doses of Lokelma  - Avoid nephrotoxic agents   # HFrEF with recovered EF # Nonischemic Cardiomyopathy  # Hypertension  - Last TTE with EF of 55-60% in 2019, G2DD. TTE repeated yesterday and essentially unchanged.  - Will monitor closely for pulmonary edema with fluid resuscitation. Euvolemic today.  - Hold home medications due to hypotension and AKI (Coreg, Losartan, and Spironolactone)   #  Pulmonary hypertension  - Secondary to obstructive sleep apnea and chronic congestive heart failure  # NAFLD - No follow up with GI in chart  - Unknown if progressed to cirrhosis   Best practice:  Diet: N.p.o. Pain/Anxiety/Delirium protocol (if indicated): Pain medication as needed VAP protocol (if indicated): N/A DVT prophylaxis: Heparin  GI prophylaxis: N/A Glucose control: Monitor Mobility: Bedrest Code Status: Full Family Communication: Patient updated  Disposition: ICU  Labs   CBC: Recent Labs  Lab 12/06/19 1944 12/07/19 0239 12/08/19 0549 12/09/19 0220  WBC 31.5* 27.9* 39.5* 34.4*  NEUTROABS 26.5*  --  35.7* 29.4*  HGB  10.6* 10.1* 9.6* 9.1*  HCT 34.4* 32.3* 31.2* 29.9*  MCV 104.9* 102.9* 105.4* 104.9*  PLT 439* 356 455* 424*    Basic Metabolic Panel: Recent Labs  Lab 12/07/19 0239 12/07/19 1829 12/08/19 0549 12/08/19 1105 12/09/19 0220  NA 133* 136 136 137 138  K 5.2* 4.9 5.5* 5.2* 4.6  CL 99 102 104 104 107  CO2 19* 22 15* 20* 21*  GLUCOSE 112* 132* 153* 173* 144*  BUN 102* 93* 92* 87* 82*  CREATININE 5.26* 4.74* 4.58* 4.40* 3.94*  CALCIUM 10.7* 10.3 10.2 10.2 10.5*  MG 1.9 1.9  --   --  2.0  PHOS 3.9 4.5  --   --   --    GFR: Estimated Creatinine Clearance: 18.9 mL/min (A) (by C-G formula based on SCr of 3.94 mg/dL (H)). Recent Labs  Lab 12/06/19 1944 12/07/19 0239 12/08/19 0549 12/08/19 0634 12/08/19 1115 12/09/19 0220  WBC 31.5* 27.9* 39.5*  --   --  34.4*  LATICACIDVEN  --  1.4  --  0.8 0.8  --     Liver Function Tests: Recent Labs  Lab 12/06/19 1944 12/08/19 0549 12/09/19 0220  AST 13* 11* 10*  ALT 11 10 12   ALKPHOS 112 108 96  BILITOT 1.2 1.5* 1.0  PROT 7.3 6.1* 6.1*  ALBUMIN 2.3* 1.8* 1.8*   Recent Labs  Lab 12/06/19 1944  LIPASE 28   No results for input(s): AMMONIA in the last 168 hours.  ABG    Component Value Date/Time   PHART 7.389 07/12/2013 1438   PCO2ART 50.6 (H) 07/12/2013 1438   PO2ART 51.0 (L) 07/12/2013 1438   HCO3 30.5 (H) 07/12/2013 1438   TCO2 32 07/12/2013 1438   O2SAT 85.0 07/12/2013 1438     Coagulation Profile: Recent Labs  Lab 12/07/19 1158  INR 1.2    Cardiac Enzymes: No results for input(s): CKTOTAL, CKMB, CKMBINDEX, TROPONINI in the last 168 hours.  HbA1C: Hgb A1c MFr Bld  Date/Time Value Ref Range Status  12/08/2019 08:34 AM 5.5 4.8 - 5.6 % Final    Comment:    (NOTE)         Prediabetes: 5.7 - 6.4         Diabetes: >6.4         Glycemic control for adults with diabetes: <7.0     CBG: Recent Labs  Lab 12/08/19 1524 12/08/19 1919 12/08/19 2313 12/09/19 0312 12/09/19 0729  GLUCAP 124* 118* 122* 125*  119*    Review of Systems:   Other than nausea mild vomiting negative on 14 point review of systems other than that mentioned in HPI.  Past Medical History  She,  has a past medical history of Acute combined systolic and diastolic heart failure ([place]) (06/2013), Acute renal failure ([place]), BMI 60.0-69.9, adult ([place]), CHF (congestive heart failure) ([place]) (06/2013), Hypertension, Morbid obesity ([place]),  NICM (nonischemic cardiomyopathy) ([place]) (06/2013), Obstructive sleep apnea (adult) (pediatric) (06/2013), Other ascites (06/2013), and Other chronic nonalcoholic liver disease (0/0349).   Surgical History    Past Surgical History:  Procedure Laterality Date  . CARDIAC CATHETERIZATION  2015  . CESAREAN SECTION    . DILATION AND CURETTAGE OF UTERUS  1984  . LEFT HEART CATHETERIZATION WITH CORONARY ANGIOGRAM N/A 07/12/2013   Procedure: LEFT HEART CATHETERIZATION WITH CORONARY ANGIOGRAM;  Surgeon: Troy Sine, MD;  Location: [facility] CATH LAB;  Service: Cardiovascular;  Laterality: N/A;  . TONSILLECTOMY       Social History   reports that she has never smoked. She has never used smokeless tobacco. She reports current alcohol use. She reports that she does not use drugs.   Family History   Her family history includes Breast cancer in her maternal grandmother; CAD in her father; Diabetes in her brother; Heart failure in her mother; Hypertension in her brother, father, and mother; Stroke in her mother; Stroke (age of onset: 7) in her brother.   Allergies Allergies  Allergen Reactions  . Codeine Other (See Comments)    "makes me hyper"  . Demerol [Meperidine] Other (See Comments)    "sick"  . Lisinopril Cough  . Peanut-Containing Drug Products Hives and Swelling    "almonds"     Home Medications  Prior to Admission medications   Medication Sig Start Date End Date Taking? Authorizing Provider  acetaminophen (TYLENOL) 500 MG tablet Take 500 mg by mouth every 6 (six) hours as needed (pain).   Yes  [provider]  aspirin EC 81 MG EC tablet Take 1 tablet (81 mg total) by mouth daily. 07/13/13  Yes Charlynne Cousins, MD  carvedilol (COREG) 25 MG tablet TAKE (1) TABLET TWICE A DAY WITH FOOD---BREAKFAST AND SUPPER. Patient taking differently: Take 25 mg by mouth 2 (two) times daily with a meal.  02/24/19  Yes Larey Dresser, MD  furosemide (LASIX) 40 MG tablet Take 1 tablet (40 mg total) by mouth daily. Must have office visit please call [phone] 10/25/19  Yes Larey Dresser, MD  losartan (COZAAR) 50 MG tablet TAKE 1 TABLET BY MOUTH TWICE DAILY 11/30/19  Yes Larey Dresser, MD  spironolactone (ALDACTONE) 25 MG tablet TAKE 1 TABLET BY MOUTH ONCE DAILY. 11/30/19  Yes Larey Dresser, MD     Dr. Jose Persia Internal Medicine PGY-2   12/09/2019, 8:11 AM

## 2019-12-10 DIAGNOSIS — K651 Peritoneal abscess: Secondary | ICD-10-CM | POA: Diagnosis not present

## 2019-12-10 DIAGNOSIS — K659 Peritonitis, unspecified: Secondary | ICD-10-CM | POA: Diagnosis not present

## 2019-12-10 LAB — GLUCOSE, CAPILLARY
Glucose-Capillary: 72 mg/dL (ref 70–99)
Glucose-Capillary: 79 mg/dL (ref 70–99)
Glucose-Capillary: 99 mg/dL (ref 70–99)
Glucose-Capillary: 98 mg/dL (ref 70–99)
Glucose-Capillary: 89 mg/dL (ref 70–99)

## 2019-12-10 LAB — BASIC METABOLIC PANEL
Anion gap: 7 (ref 5–15)
BUN: 70 mg/dL — ABNORMAL HIGH (ref 8–23)
CO2: 25 mmol/L (ref 22–32)
Calcium: 10.3 mg/dL (ref 8.9–10.3)
Chloride: 110 mmol/L (ref 98–111)
Creatinine, Ser: 3.12 mg/dL — ABNORMAL HIGH (ref 0.44–1.00)
GFR, Estimated: 16 mL/min — ABNORMAL LOW (ref >60)
Glucose, Bld: 76 mg/dL (ref 70–99)
Potassium: 4.6 mmol/L (ref 3.5–5.1)
Sodium: 142 mmol/L (ref 135–145)

## 2019-12-10 LAB — CBC
HCT: 30.1 % — ABNORMAL LOW (ref 36.0–46.0)
Hemoglobin: 9.1 g/dL — ABNORMAL LOW (ref 12.0–15.0)
MCH: 31.9 pg (ref 26.0–34.0)
MCHC: 30.2 g/dL (ref 30.0–36.0)
MCV: 105.6 fL — ABNORMAL HIGH (ref 80.0–100.0)
Platelets: 364 10*3/uL (ref 150–400)
RBC: 2.85 MIL/uL — ABNORMAL LOW (ref 3.87–5.11)
RDW: 13.6 % (ref 11.5–15.5)
WBC: 20 10*3/uL — ABNORMAL HIGH (ref 4.0–10.5)
nRBC: 0 % (ref 0.0–0.2)

## 2019-12-10 LAB — MAGNESIUM: Magnesium: 1.9 mg/dL (ref 1.7–2.4)

## 2019-12-10 MED ORDER — HEPARIN SODIUM (PORCINE) 5000 UNIT/ML IJ SOLN
5000.0000 [IU] | Freq: Three times a day (TID) | INTRAMUSCULAR | Status: DC
  Administered 2019-12-10 – 2019-12-17 (×20): 5000 [IU] via SUBCUTANEOUS
  Filled 2019-12-10 (×20): qty 1

## 2019-12-10 MED ORDER — HEPARIN SODIUM (PORCINE) 5000 UNIT/ML IJ SOLN
5000.0000 [IU] | Freq: Three times a day (TID) | INTRAMUSCULAR | Status: DC
  Administered 2019-12-10: 5000 [IU] via SUBCUTANEOUS
  Filled 2019-12-10: qty 1

## 2019-12-10 NOTE — Progress Notes (Signed)
Subjective/Chief Complaint: Off all pressors - BP stable WBC significantly decreased Less abdominal pain Flatus, no BM   Objective: Vital signs in last 24 hours: Temp:  [97.9 F (36.6 C)-99 F (37.2 C)] 99 F (37.2 C) (10/29 0720) Pulse Rate:  [57-80] 77 (10/29 0700) Resp:  [15-29] 21 (10/29 0700) BP: (72-121)/(42-92) 115/63 (10/29 0700) SpO2:  [88 %-98 %] 95 % (10/29 0700) Last BM Date: 12/08/19  Intake/Output from previous day: 10/28 0701 - 10/29 0700 In: 310.4 [I.V.:165.4; IV Piggyback:130] Out: 1502.5 [Urine:1465; Drains:37.5] Intake/Output this shift: No intake/output data recorded.  Gen:  Alert, NAD, non-toxic appearing female who appears states age Card:  Regular rate and rhythm, heart murmur present Pulm:  Normal effort on nasal cannula, clear to auscultation bilaterally Abd: Soft, obese, JP in place with purulent drainage and air in bulb(37.5 cc/24h), minimal tenderness Skin: warm and dry, skin breakdown inframammary fold and groin Psych: A&Ox3   Lab Results:  Recent Labs    12/09/19 0220 12/10/19 0515  WBC 34.4* 20.0*  HGB 9.1* 9.1*  HCT 29.9* 30.1*  PLT 424* 364   BMET Recent Labs    12/09/19 0220 12/10/19 0515  NA 138 142  K 4.6 4.6  CL 107 110  CO2 21* 25  GLUCOSE 144* 76  BUN 82* 70*  CREATININE 3.94* 3.12*  CALCIUM 10.5* 10.3   PT/INR Recent Labs    12/07/19 1158  LABPROT 15.2  INR 1.2   ABG No results for input(s): PHART, HCO3 in the last 72 hours.  Invalid input(s): PCO2, PO2  Studies/Results: No results found.  Anti-infectives: Anti-infectives (From admission, onward)   Start     Dose/Rate Route Frequency Ordered Stop   12/09/19 1000  anidulafungin (ERAXIS) 100 mg in sodium chloride 0.9 % 100 mL IVPB        100 mg 78 mL/hr over 100 Minutes Intravenous Every 24 hours 12/08/19 1050 12/15/19 0959   12/08/19 1145  anidulafungin (ERAXIS) 200 mg in sodium chloride 0.9 % 200 mL IVPB        200 mg 78 mL/hr over 200  Minutes Intravenous  Once 12/08/19 1050 12/08/19 1633   12/07/19 0600  metroNIDAZOLE (FLAGYL) IVPB 500 mg  Status:  Discontinued        500 mg 100 mL/hr over 60 Minutes Intravenous Every 8 hours 12/07/19 0030 12/07/19 0122   12/07/19 0600  piperacillin-tazobactam (ZOSYN) IVPB 2.25 g        2.25 g 100 mL/hr over 30 Minutes Intravenous Every 8 hours 12/07/19 0122     12/07/19 0130  fluconazole (DIFLUCAN) IVPB 200 mg  Status:  Discontinued        200 mg 100 mL/hr over 60 Minutes Intravenous Every 24 hours 12/07/19 0030 12/08/19 1050   12/06/19 2200  ceFEPIme (MAXIPIME) 2 g in sodium chloride 0.9 % 100 mL IVPB        2 g 200 mL/hr over 30 Minutes Intravenous  Once 12/06/19 2146 12/06/19 2330   12/06/19 2200  metroNIDAZOLE (FLAGYL) IVPB 500 mg        500 mg 100 mL/hr over 60 Minutes Intravenous  Once 12/06/19 2146 12/06/19 2358      Assessment/Plan: Morbid obesity HTN combined systolic and diastolic heart failure - last echo 2107 EF 55-60%; echo ordered this AM await final read Pulmonary HTN OSA NASH, non-alcoholic fatty liver disease  Acute renal failure with history of CKD3 - BUN 102, creatinine 5.26, per chart review baseline Cr around 1.6 Hyperkalemia 5.5  Septic shock due to below - per CCM, off pressors Acute sigmoid diverticulitis with perforation and 11.0 x 5.9 x 9.3 cm abscess - S/P IR drainage of abscess 10/26. GS w/ GNR, GPC, GPR; Cx w. Streptococcus anginosis, follow cultures/sensitivites; monitor output  - afebrile, WBC down to 20 from 34 - clear liquids - In the setting of multiple above medical comorbidities perioperative morbidity and mortality are high. According to NSQIP, this patient has a 34.8% likelihood of serious perioperative complication, 5.4% risk of cardiac complication, and 65.6% chance of perioperative mortality. I discussed the possibility of surgery with the patient if non-operative measures are unsuccessful or her condition worsens and she states if she  was going to die without an operation she would want to go to surgery.  - Greatly appreciate CCM's care of this patient    FEN: clear liquids, IVF ID: Zosyn, fluconazole 10/25 >>  VTE: SCD's, SQH Foley: per CCM Dispo: ICU  Will eventually need follow-up CT scan to evaluate abscess.  We hope to manage this episode with the drain and then have her follow-up with one of our colorectal surgeons for possible single-stage colon resection with anastomosis to avoid the need for colostomy.  LOS: 3 days    Phyllis Todd 12/10/2019

## 2019-12-10 NOTE — Progress Notes (Signed)
NAME:  Phyllis Todd, MRN:  [id number], DOB:  May 27, 1950, LOS: 3 ADMISSION DATE:  12/06/2019, CONSULTATION DATE: 12/07/2019 REFERRING MD: Ottis Stain, CHIEF COMPLAINT: Left lower quadrant abdominal pain  Brief History   69 year old with 1 week history of left lower quadrant abdominal pain  History of present illness   Patient is a 69 year old female, morbidly obese BMI of 57 presents to the emergency room with a 1 week history of left lower quadrant abdominal pain.  With this she has associated anorexia with poor oral intake of solids and fluids, last bowel movement was 3 days ago.  She has a prior history of systolic and diastolic heart failure with an ejection fraction of 35 to 40% although on reevaluation October 2019 her ejection fraction was up to 55 to 60%.  She also has a history of [place].  Patient has been hypotensive in the emergency room currently receiving her third liter of IV fluid.  Blood pressure is running about 100/60.  Blood cultures have been planted.  CT scan of the abdomen shows intra-abdominal abscess measuring 11.0 x 5.9 x 9.3 cm.  Labs are pertinent for a white count of 31,000, hemoglobin of 10, platelet count 439.  Creatinine is 5.75 with a baseline of 1.6.  Anion gap is 18.  Lactic acid is pending. Surgery is considering adequate drainage of the abscess versus IR placing a drain.  Past Medical History   . Acute combined systolic and diastolic heart failure ([place]) 06/2013   Class 3 - ECHO EF 35-40%  . Acute renal failure ([place])   . BMI 60.0-69.9, adult ([place])   . CHF (congestive heart failure) ([place]) 06/2013  . Hypertension   . Morbid obesity ([place])   . NICM (nonischemic cardiomyopathy) ([place]) 06/2013  . Obstructive sleep apnea (adult) (pediatric) 06/2013  . Other ascites 06/2013  . Other chronic nonalcoholic liver disease 0/2542   Significant Hospital Events   10/25 >> Admitted to [facility] 10/26 >> Drain placed into abscess, increased pressor needs 10/29 >> Off  pressors  Consults:  Surgery, IR  Procedures:  10/26 >> CT-guided drain placement   Significant Diagnostic Tests:  CT A/P (10/25) >> Findings suggestive of extensive mid ileal enteritis and possible sigmoid colonic diverticulitis. There is an adjacent multilocular fluid collection consistent with an intra-abdominal abscess measuring 11.0 x 5.9 x 9.3 cm. Small amount of pneumoperitoneum  Micro Data:  Blood cultures (10/25) >> Pending  Abscess cultures (10/26) >> Pan-sensitive E. Coli and Strep anginosis   Antimicrobials:  Cefepime 10/25 Metronidazole 10/25  Fluconazole 10/25 - 10/27 Zosyn 10/25 >> Eraxis 10/27 >>  Interim history/subjective:   No overnight events. States she feels better this morning and is ready to eat. She is experiencing some pain in her legs that she feels is secondary to swelling.   Objective   Blood pressure 115/63, pulse 77, temperature 99 F (37.2 C), temperature source Bladder, resp. rate (!) 21, height 5\' 4"  (1.626 m), weight 134.1 kg, SpO2 95 %.        Intake/Output Summary (Last 24 hours) at 12/10/2019 0758 Last data filed at 12/10/2019 0600 Gross per 24 hour  Intake 310.43 ml  Output 1502.5 ml  Net -1192.07 ml   Filed Weights   12/06/19 1919 12/07/19 0136 12/08/19 0500  Weight: (!) 152 kg 129.2 kg 134.1 kg   Examination: General: Obese white female in no acute distress HENT: Normal Lungs: Clear to auscultation bilaterally.  Cardiovascular: Regular rate and rhythm, 2/6 systolic murmur Abdomen: No tenderness.  No distension. No bowel sounds today.  Neuro: Awake alert nonfocal GU: N/A  Resolved Hospital Problem list   NA  Assessment & Plan:   # Septic Shock # Sigmoid Abdominal Abscess - Secondary to diverticulosis  - s/p fluid resuscitation  - s/p CT-guided drain placement on 10/26. - So far, 542 mL drained.  - Abscess cultures show pan-sensitive E. Coli and Strep Anginosis  - Blood cultures NGTD at day 3 - Titrated off  pressors overnight. Monitor BP closely to ensure MAPs> 65  - Surgery holding off on additional intervention at this time  - Continue Zosyn and Eraxis, will consider de-escalation when cultures are complete.  - Dilaudid PRN for pain  # Acute Kidney Injury - In light of dehydration and sepsis. UA without evidence of ATN. BUN/Cr ratio of 20 supporting pre-renal cause. S/p 4.5 liter bolus of LR without significant improvement. Continues to improve today. Great UOP. # Hx of CKD3 - Baseline creatinine: ~ 1.6 # Hyperkalemia - Improved with Lokelma. Stable today.  - Continue to monitor BMP daily - Strict UOP  - Avoid nephrotoxic agents   # HFrEF with recovered EF # Nonischemic Cardiomyopathy  # Hypertension  - Last TTE with EF of 55-60% in 2019, G2DD. TTE repeated yesterday and essentially unchanged.  - Will monitor closely for pulmonary edema with fluid resuscitation.  - Patient having some leg swelling this morning; requesting her home Lasix. Will hold off as she may experience increased diuresis as her kidney function improves - Hold home medications due to hypotension and AKI (Coreg, Losartan, and Spironolactone)   # Pulmonary hypertension  - Secondary to obstructive sleep apnea and chronic congestive heart failure  # NAFLD - No follow up with GI in chart  - Unknown if progressed to cirrhosis   Best practice:  Diet: N.p.o. Pain/Anxiety/Delirium protocol (if indicated): Pain medication as needed VAP protocol (if indicated): N/A DVT prophylaxis: Heparin  GI prophylaxis: N/A Glucose control: Monitor Mobility: Bedrest Code Status: Full Family Communication: Patient updated  Disposition: ICU  Labs   CBC: Recent Labs  Lab 12/06/19 1944 12/07/19 0239 12/08/19 0549 12/09/19 0220 12/10/19 0515  WBC 31.5* 27.9* 39.5* 34.4* 20.0*  NEUTROABS 26.5*  --  35.7* 29.4*  --   HGB 10.6* 10.1* 9.6* 9.1* 9.1*  HCT 34.4* 32.3* 31.2* 29.9* 30.1*  MCV 104.9* 102.9* 105.4* 104.9* 105.6*  PLT  439* 356 455* 424* 332    Basic Metabolic Panel: Recent Labs  Lab 12/07/19 0239 12/07/19 0239 12/07/19 1829 12/08/19 0549 12/08/19 1105 12/09/19 0220 12/10/19 0515  NA 133*   < > 136 136 137 138 142  K 5.2*   < > 4.9 5.5* 5.2* 4.6 4.6  CL 99   < > 102 104 104 107 110  CO2 19*   < > 22 15* 20* 21* 25  GLUCOSE 112*   < > 132* 153* 173* 144* 76  BUN 102*   < > 93* 92* 87* 82* 70*  CREATININE 5.26*   < > 4.74* 4.58* 4.40* 3.94* 3.12*  CALCIUM 10.7*   < > 10.3 10.2 10.2 10.5* 10.3  MG 1.9  --  1.9  --   --  2.0 1.9  PHOS 3.9  --  4.5  --   --   --   --    < > = values in this interval not displayed.   GFR: Estimated Creatinine Clearance: 23.9 mL/min (A) (by C-G formula based on SCr of 3.12 mg/dL (H)). Recent Labs  Lab 12/07/19 0239 12/08/19 0549 12/08/19 0634 12/08/19 1115 12/09/19 0220 12/10/19 0515  WBC 27.9* 39.5*  --   --  34.4* 20.0*  LATICACIDVEN 1.4  --  0.8 0.8  --   --     Liver Function Tests: Recent Labs  Lab 12/06/19 1944 12/08/19 0549 12/09/19 0220  AST 13* 11* 10*  ALT 11 10 12   ALKPHOS 112 108 96  BILITOT 1.2 1.5* 1.0  PROT 7.3 6.1* 6.1*  ALBUMIN 2.3* 1.8* 1.8*   Recent Labs  Lab 12/06/19 1944  LIPASE 28   No results for input(s): AMMONIA in the last 168 hours.  ABG    Component Value Date/Time   PHART 7.389 07/12/2013 1438   PCO2ART 50.6 (H) 07/12/2013 1438   PO2ART 51.0 (L) 07/12/2013 1438   HCO3 30.5 (H) 07/12/2013 1438   TCO2 32 07/12/2013 1438   O2SAT 85.0 07/12/2013 1438     Coagulation Profile: Recent Labs  Lab 12/07/19 1158  INR 1.2    Cardiac Enzymes: No results for input(s): CKTOTAL, CKMB, CKMBINDEX, TROPONINI in the last 168 hours.  HbA1C: Hgb A1c MFr Bld  Date/Time Value Ref Range Status  12/08/2019 08:34 AM 5.5 4.8 - 5.6 % Final    Comment:    (NOTE)         Prediabetes: 5.7 - 6.4         Diabetes: >6.4         Glycemic control for adults with diabetes: <7.0     CBG: Recent Labs  Lab 12/09/19 1915  12/09/19 2322 12/09/19 2353 12/10/19 0316 12/10/19 0718  GLUCAP 81 69* 126* 72 79    Review of Systems:   Other than nausea mild vomiting negative on 14 point review of systems other than that mentioned in HPI.  Past Medical History  She,  has a past medical history of Acute combined systolic and diastolic heart failure ([place]) (06/2013), Acute renal failure ([place]), BMI 60.0-69.9, adult ([place]), CHF (congestive heart failure) ([place]) (06/2013), Hypertension, Morbid obesity ([place]), NICM (nonischemic cardiomyopathy) ([place]) (06/2013), Obstructive sleep apnea (adult) (pediatric) (06/2013), Other ascites (06/2013), and Other chronic nonalcoholic liver disease (0/6301).   Surgical History    Past Surgical History:  Procedure Laterality Date  . CARDIAC CATHETERIZATION  2015  . CESAREAN SECTION    . DILATION AND CURETTAGE OF UTERUS  1984  . LEFT HEART CATHETERIZATION WITH CORONARY ANGIOGRAM N/A 07/12/2013   Procedure: LEFT HEART CATHETERIZATION WITH CORONARY ANGIOGRAM;  Surgeon: Troy Sine, MD;  Location[facility] CATH LAB;  Service: Cardiovascular;  Laterality: N/A;  . TONSILLECTOMY       Social History   reports that she has never smoked. She has never used smokeless tobacco. She reports current alcohol use. She reports that she does not use drugs.   Family History   Her family history includes Breast cancer in her maternal grandmother; CAD in her father; Diabetes in her brother; Heart failure in her mother; Hypertension in her brother, father, and mother; Stroke in her mother; Stroke (age of onset: 71) in her brother.   Allergies Allergies  Allergen Reactions  . Codeine Other (See Comments)    "makes me hyper"  . Demerol [Meperidine] Other (See Comments)    "sick"  . Lisinopril Cough  . Peanut-Containing Drug Products Hives and Swelling    "almonds"     Home Medications  Prior to Admission medications   Medication Sig Start Date End Date Taking? Authorizing Provider  acetaminophen (TYLENOL)  500 MG  tablet Take 500 mg by mouth every 6 (six) hours as needed (pain).   Yes [provider]  aspirin EC 81 MG EC tablet Take 1 tablet (81 mg total) by mouth daily. 07/13/13  Yes Charlynne Cousins, MD  carvedilol (COREG) 25 MG tablet TAKE (1) TABLET TWICE A DAY WITH FOOD---BREAKFAST AND SUPPER. Patient taking differently: Take 25 mg by mouth 2 (two) times daily with a meal.  02/24/19  Yes Larey Dresser, MD  furosemide (LASIX) 40 MG tablet Take 1 tablet (40 mg total) by mouth daily. Must have office visit please call [phone] 10/25/19  Yes Larey Dresser, MD  losartan (COZAAR) 50 MG tablet TAKE 1 TABLET BY MOUTH TWICE DAILY 11/30/19  Yes Larey Dresser, MD  spironolactone (ALDACTONE) 25 MG tablet TAKE 1 TABLET BY MOUTH ONCE DAILY. 11/30/19  Yes Larey Dresser, MD     Dr. Jose Persia Internal Medicine PGY-2   12/10/2019, 7:58 AM

## 2019-12-10 NOTE — Progress Notes (Addendum)
Pharmacy Antibiotic Note  Phyllis Todd is a 69 y.o. female admitted on 12/06/2019 with sepsis and intra-abdominal abscess s/p drain placement 10/26.  Pharmacy has been consulted for Zosyn and Eraxis dosing. WBC elevated but trending down, afebrile. Acute on chronic renal failure.   Plan: Zosyn 2.25 mg q8h - consider de-escalation to Unasyn Eraxis 100 mg q24h - consider discontinuing 10/30 if clinical status continues to improve.  Trend WBC, temp, renal function F/u infectious work-up  Height: 5\' 4"  (162.6 cm) Weight: 134.1 kg (295 lb 10.2 oz) IBW/kg (Calculated) : 54.7  Temp (24hrs), Avg:98.2 F (36.8 C), Min:97.9 F (36.6 C), Max:99 F (37.2 C)  Recent Labs  Lab 12/06/19 1944 12/06/19 1944 12/07/19 0239 12/07/19 0239 12/07/19 1829 12/08/19 0549 12/08/19 0634 12/08/19 1105 12/08/19 1115 12/09/19 0220 12/10/19 0515  WBC 31.5*  --  27.9*  --   --  39.5*  --   --   --  34.4* 20.0*  CREATININE 5.75*   < > 5.26*   < > 4.74* 4.58*  --  4.40*  --  3.94* 3.12*  LATICACIDVEN  --   --  1.4  --   --   --  0.8  --  0.8  --   --    < > = values in this interval not displayed.    Estimated Creatinine Clearance: 23.9 mL/min (A) (by C-G formula based on SCr of 3.12 mg/dL (H)).    Allergies  Allergen Reactions  . Codeine Other (See Comments)    "makes me hyper"  . Demerol [Meperidine] Other (See Comments)    "sick"  . Lisinopril Cough  . Peanut-Containing Drug Products Hives and Swelling    "almonds"    Antimicrobials this admission: Zosyn 10/26 >>   Eraxis 10/27 >> 11/2 stop date in  Microbiology results: 10/25 Bcx - ng x2d 10/26 abscess cx - pan sensitive E.coli, strep aginosis - holding for possible anaerobe  1028 MRSA PCR - neg  Thank you for allowing pharmacy to be a part of this patient's care.  Laurey Arrow 12/10/2019 7:08 AM

## 2019-12-10 NOTE — Progress Notes (Signed)
eLink Physician-Brief Progress Note Patient Name: Phyllis Todd DOB: July 25, 1950 MRN: [id number]   Date of Service  12/10/2019  HPI/Events of Note  Patient needs a.m. labs ordered.  eICU Interventions   a.m. labs ordered.        [name] U Katori Wirsing 12/10/2019, 1:19 AM

## 2019-12-10 NOTE — Progress Notes (Signed)
Referring Physician(s): Dr Jonny Ruiz  Supervising Physician: Dr. Earleen Newport  Patient Status:  [facility] - In-pt  Chief Complaint:  Diverticular abscess  Subjective: Pt feeling better Off pressors, sitting up in chair.   Allergies: Codeine, Demerol [meperidine], Lisinopril, and Peanut-containing drug products  Medications:  Current Facility-Administered Medications:  .  0.9 %  sodium chloride infusion, 250 mL, Intravenous, Continuous, Oletta Darter Virgina Evener, MD, Stopped at 12/08/19 [phone]  acetaminophen (TYLENOL) tablet 650 mg, 650 mg, Oral, Q6H PRN, Anders Simmonds, MD, 650 mg at 12/10/19 0521 .  anidulafungin (ERAXIS) 100 mg in sodium chloride 0.9 % 100 mL IVPB, 100 mg, Intravenous, Q24H, Candee Furbish, MD, Last Rate: 78 mL/hr at 12/10/19 1001, 100 mg at 12/10/19 1001 .  Chlorhexidine Gluconate Cloth 2 % PADS 6 each, 6 each, Topical, Daily, Shellia Cleverly, MD, 6 each at 12/10/19 0930 .  docusate sodium (COLACE) capsule 100 mg, 100 mg, Oral, BID PRN, Gleason, Otilio Carpen, PA-C .  guaiFENesin (MUCINEX) 12 hr tablet 600 mg, 600 mg, Oral, BID, Candee Furbish, MD, 600 mg at 12/10/19 1002 .  heparin injection 5,000 Units, 5,000 Units, Subcutaneous, Q8H, Sood, Vineet, MD .  HYDROmorphone (DILAUDID) injection 0.5 mg, 0.5 mg, Intravenous, Q4H PRN, Jose Persia, MD, 0.5 mg at 12/08/19 1636 .  MEDLINE mouth rinse, 15 mL, Mouth Rinse, BID, Candee Furbish, MD, 15 mL at 12/10/19 1002 .  norepinephrine (LEVOPHED) 4mg  in 268mL premix infusion, 0-40 mcg/min, Intravenous, Titrated, Candee Furbish, MD, Stopped at 12/09/19 2232 .  ondansetron (ZOFRAN) injection 4 mg, 4 mg, Intravenous, Q6H PRN, Gleason, Otilio Carpen, PA-C, 4 mg at 12/07/19 0238 .  piperacillin-tazobactam (ZOSYN) IVPB 2.25 g, 2.25 g, Intravenous, Q8H, Erenest Blank, RPH, Last Rate: 100 mL/hr at 12/10/19 0513, 2.25 g at 12/10/19 0513 .  polyethylene glycol (MIRALAX / GLYCOLAX) packet 17 g, 17 g, Oral, Daily PRN, Gleason, Otilio Carpen, PA-C .   sodium chloride 0.9 % bolus 1,000 mL, 1,000 mL, Intravenous, Once, Boone Master E, MD .  sodium chloride flush (NS) 0.9 % injection 10-40 mL, 10-40 mL, Intracatheter, Q12H, Candee Furbish, MD, 20 mL at 12/10/19 1003 .  sodium chloride flush (NS) 0.9 % injection 10-40 mL, 10-40 mL, Intracatheter, PRN, Candee Furbish, MD .  sodium chloride flush (NS) 0.9 % injection 5 mL, 5 mL, Intracatheter, Q8H, Aletta Edouard, MD, 5 mL at 12/10/19 0514 .  vasopressin (PITRESSIN) 20 Units in sodium chloride 0.9 % 100 mL infusion-*FOR SHOCK*, 0-0.03 Units/min, Intravenous, Continuous, Jose Persia, MD, Stopped at 12/09/19 0927    Vital Signs: BP 112/78   Pulse 77   Temp 98.6 F (37 C) (Oral)   Resp 20   Ht 5\' 4"  (1.626 m)   Wt 134.1 kg   SpO2 92%   BMI 50.75 kg/m   Physical Exam Vitals reviewed.  Skin:    General: Skin is warm.     Comments: Skin is NT no bleeding Clean and dry Output now dark green with some debris and air filling bulb. RN reports having to re-engage suction multiple times/day      Imaging: CT ABDOMEN PELVIS WO CONTRAST  Result Date: 12/06/2019 CLINICAL DATA:  Right lower quadrant abdominal pain EXAM: CT ABDOMEN AND PELVIS WITHOUT CONTRAST TECHNIQUE: Multidetector CT imaging of the abdomen and pelvis was performed following the standard protocol without IV contrast. COMPARISON:  None. FINDINGS: Lower chest: The visualized heart size within normal limits. No pericardial fluid/thickening. No hiatal hernia. The  visualized portions of the lungs are clear. Hepatobiliary: Although limited due to the lack of intravenous contrast, normal in appearance without gross focal abnormality. No evidence of calcified gallstones or biliary ductal dilatation. Pancreas:  Unremarkable.  No surrounding inflammatory changes. Spleen: Normal in size. Although limited due to the lack of intravenous contrast, normal in appearance. Adrenals/Urinary Tract: Both adrenal glands appear normal.  Right-sided renal calculi are noted the largest within the midpole measures 1 cm. No right-sided hydronephrosis. No left-sided renal or collecting system calculi are seen. Abutting the superior surface of the bladder is the loculated pericolonic collection with mild wall thickening at the superior bladder which may be from the adjacent inflammatory changes. Stomach/Bowel: The stomach and proximal small bowel are unremarkable. Within the mid abdomen within a mid ileal loops there appears to be diffuse wall thickening and significant surrounding inflammatory changes. There is an adjacent air and fluid loculated collection measuring approximately 11.0 x 5.9 by 9.3 cm. There is also fat stranding changes seen around the mid sigmoid colon with diverticula. The remainder of the colon is unremarkable. Vascular/Lymphatic: There are no enlarged abdominal or pelvic lymph nodes. Scattered mild aortic atherosclerosis is seen. Reproductive: The prostate is unremarkable. Coarse calcifications are seen within the prostate. Other: A small amount of pneumoperitoneum seen along the anterior abdominal wall and under the diaphragms. Musculoskeletal: No acute or significant osseous findings. IMPRESSION: Findings suggestive of extensive mid ileal enteritis and possible sigmoid colonic diverticulitis. There is an adjacent multilocular fluid collection consistent with an intra-abdominal abscess measuring 11.0 x 5.9 x 9.3 cm. Small amount of pneumoperitoneum Nonobstructing right renal calculi Aortic Atherosclerosis (ICD10-I70.0). These results were called by telephone at the time of interpretation on 12/06/2019 at 9:42 pm to provider who verbally acknowledged these results. Electronically Signed   By: Prudencio Pair M.D.   On: 12/06/2019 21:43   DG CHEST PORT 1 VIEW  Result Date: 12/07/2019 CLINICAL DATA:  Central venous catheter placement EXAM: PORTABLE CHEST 1 VIEW COMPARISON:  December 06, 2019 FINDINGS: Central catheter tip is in the  superior vena cava. No pneumothorax. Lungs are clear. Heart is slightly enlarged with pulmonary vascularity normal. No adenopathy. No bone lesions. IMPRESSION: Central catheter tip in superior vena cava. No pneumothorax. Lungs clear. Stable cardiac prominence. Electronically Signed   By: Lowella Grip III M.D.   On: 12/07/2019 11:41   DG Chest Portable 1 View  Result Date: 12/06/2019 CLINICAL DATA:  Hypotension. Lower abdominal pain and diarrhea. Vomiting. EXAM: PORTABLE CHEST 1 VIEW COMPARISON:  Radiograph 07/17/2013. Included portions from lung basis of abdominal CT earlier today. FINDINGS: The cardiomediastinal contours are normal. Upper normal heart size. Mild elevation of right hemidiaphragm pulmonary vasculature is normal. No consolidation, pleural effusion, or pneumothorax. No acute osseous abnormalities are seen. Free air in the abdomen on concurrent abdominal CT is not well demonstrated by radiograph. IMPRESSION: 1. No acute chest findings. 2. Mild chronic elevation of right hemidiaphragm. Electronically Signed   By: Keith Rake M.D.   On: 12/06/2019 21:46   ECHOCARDIOGRAM COMPLETE  Result Date: 12/07/2019    ECHOCARDIOGRAM REPORT   Patient Name:   Phyllis Todd Date of Exam: 12/07/2019 Medical Rec #:  [id number]   Height:       64.0 in Accession #:    [id number]  Weight:       284.8 lb Date of Birth:  12/19/1950   BSA:          2.274 m Patient Age:    35 years  BP:           95/54 mmHg Patient Gender: F           HR:           79 bpm. Exam Location:  Inpatient Procedure: 2D Echo, Cardiac Doppler, Color Doppler and Intracardiac            Opacification Agent Indications:    E75.17 Chronic systolic (congestive) heart failure  History:        Patient has prior history of Echocardiogram examinations, most                 recent 11/24/2017. Cardiomyopathy; Risk Factors:Hypertension.                 Renal Failure.  Sonographer:    Jonelle Sidle Dance Referring Phys: [id number] [place]  1. Left ventricular ejection fraction, by estimation, is 55 to 60%. The left ventricle has normal function. The left ventricle has no regional wall motion abnormalities. Left ventricular diastolic parameters were normal.  2. Right ventricular systolic function is normal. The right ventricular size is mildly enlarged. There is normal pulmonary artery systolic pressure.  3. Left atrial size was mildly dilated.  4. The mitral valve is normal in structure. No evidence of mitral valve regurgitation. No evidence of mitral stenosis.  5. The aortic valve is normal in structure. Aortic valve regurgitation is not visualized. No aortic stenosis is present.  6. The inferior vena cava is dilated in size with >50% respiratory variability, suggesting right atrial pressure of 8 mmHg. Comparison(s): Prior images reviewed side by side. The left ventricular function is unchanged. FINDINGS  Left Ventricle: Left ventricular ejection fraction, by estimation, is 55 to 60%. The left ventricle has normal function. The left ventricle has no regional wall motion abnormalities. Definity contrast agent was given IV to delineate the left ventricular  endocardial borders. The left ventricular internal cavity size was normal in size. There is no left ventricular hypertrophy. Left ventricular diastolic parameters were normal. Right Ventricle: The right ventricular size is mildly enlarged. No increase in right ventricular wall thickness. Right ventricular systolic function is normal. There is normal pulmonary artery systolic pressure. The tricuspid regurgitant velocity is 2.13  m/s, and with an assumed right atrial pressure of 8 mmHg, the estimated right ventricular systolic pressure is 49.6 mmHg. Left Atrium: Left atrial size was mildly dilated. Right Atrium: Right atrial size was normal in size. Pericardium: There is no evidence of pericardial effusion. Mitral Valve: The mitral valve is normal in structure. No evidence of mitral valve  regurgitation. No evidence of mitral valve stenosis. Tricuspid Valve: The tricuspid valve is normal in structure. Tricuspid valve regurgitation is not demonstrated. No evidence of tricuspid stenosis. Aortic Valve: The aortic valve is normal in structure. Aortic valve regurgitation is not visualized. No aortic stenosis is present. Pulmonic Valve: The pulmonic valve was normal in structure. Pulmonic valve regurgitation is not visualized. No evidence of pulmonic stenosis. Aorta: The aortic root is normal in size and structure. Venous: The inferior vena cava is dilated in size with greater than 50% respiratory variability, suggesting right atrial pressure of 8 mmHg. IAS/Shunts: No atrial level shunt detected by color flow Doppler.  LEFT VENTRICLE PLAX 2D LVIDd:         5.23 cm  Diastology LVIDs:         3.35 cm  LV e' medial:    5.98 cm/s LV PW:  1.01 cm  LV E/e' medial:  14.4 LV IVS:        1.02 cm  LV e' lateral:   10.20 cm/s LVOT diam:     1.90 cm  LV E/e' lateral: 8.5 LV SV:         64 LV SV Index:   28 LVOT Area:     2.84 cm  RIGHT VENTRICLE             IVC RV Basal diam:  2.97 cm     IVC diam: 2.07 cm RV S prime:     12.00 cm/s TAPSE (M-mode): 2.1 cm LEFT ATRIUM             Index       RIGHT ATRIUM           Index LA diam:        4.50 cm 1.98 cm/m  RA Area:     18.20 cm LA Vol (A2C):   61.4 ml 27.01 ml/m RA Volume:   52.20 ml  22.96 ml/m LA Vol (A4C):   70.6 ml 31.05 ml/m LA Biplane Vol: 68.4 ml 30.08 ml/m  AORTIC VALVE LVOT Vmax:   99.80 cm/s LVOT Vmean:  67.800 cm/s LVOT VTI:    0.224 m  AORTA Ao Root diam: 3.10 cm Ao Asc diam:  3.10 cm MITRAL VALVE               TRICUSPID VALVE MV Area (PHT): 3.27 cm    TR Peak grad:   18.1 mmHg MV Decel Time: 232 msec    TR Vmax:        213.00 cm/s MV E velocity: 86.40 cm/s MV A velocity: 80.40 cm/s  SHUNTS MV E/A ratio:  1.07        Systemic VTI:  0.22 m                            Systemic Diam: 1.90 cm Dani Gobble Croitoru MD Electronically signed by Sanda Klein  MD Signature Date/Time: 12/07/2019/3:44:13 PM    Final    CT IMAGE GUIDED DRAINAGE BY PERCUTANEOUS CATHETER  Result Date: 12/07/2019 CLINICAL DATA:  Large diverticular abscess of the colon within the pelvis. EXAM: CT GUIDED CATHETER DRAINAGE OF PERITONEAL ABSCESS ANESTHESIA/SEDATION: 1.0 mg IV Versed 50 mcg IV Fentanyl Total Moderate Sedation Time:  22 minutes The patient's level of consciousness and physiologic status were continuously monitored during the procedure by Radiology nursing. PROCEDURE: The procedure, risks, benefits, and alternatives were explained to the patient. Questions regarding the procedure were encouraged and answered. The patient understands and consents to the procedure. A time out was performed prior to initiating the procedure. CT was performed of the lower abdomen and pelvis in a supine position. The left lower abdominal wall was prepped with chlorhexidine in a sterile fashion, and a sterile drape was applied covering the operative field. A sterile gown and sterile gloves were used for the procedure. Local anesthesia was provided with 1% Lidocaine. An 18 gauge trocar needle was advanced under CT guidance to the level of the diverticular abscess. After return of air and fluid, a guidewire was advanced into the collection and the needle removed. The percutaneous tract was dilated. A 12 French percutaneous drainage catheter was advanced into the abscess. Fluid sample was aspirated and sent for culture analysis. The drain was connected to a suction bulb. It was secured at the skin with a Prolene retention suture and  StatLock device. COMPLICATIONS: None FINDINGS: Large unilocular diverticular abscess containing air-fluid level was localized. After needle access, there was return of foul-smelling, feculent fluid. After drain placement, there is rapid return brown feculent fluid. IMPRESSION: CT-guided percutaneous drainage of diverticular abscess. A 12 French drain was placed and attached to  suction bulb drainage. A sample of feculent fluid was sent for culture analysis. Electronically Signed   By: Aletta Edouard M.D.   On: 12/07/2019 16:20    Labs:  CBC: Recent Labs    12/07/19 0239 12/08/19 0549 12/09/19 0220 12/10/19 0515  WBC 27.9* 39.5* 34.4* 20.0*  HGB 10.1* 9.6* 9.1* 9.1*  HCT 32.3* 31.2* 29.9* 30.1*  PLT 356 455* 424* 364    COAGS: Recent Labs    12/07/19 1158  INR 1.2    BMP: Recent Labs    12/08/19 0549 12/08/19 1105 12/09/19 0220 12/10/19 0515  NA 136 137 138 142  K 5.5* 5.2* 4.6 4.6  CL 104 104 107 110  CO2 15* 20* 21* 25  GLUCOSE 153* 173* 144* 76  BUN 92* 87* 82* 70*  CALCIUM 10.2 10.2 10.5* 10.3  CREATININE 4.58* 4.40* 3.94* 3.12*  GFRNONAA 10* 10* 12* 16*    LIVER FUNCTION TESTS: Recent Labs    12/06/19 1944 12/08/19 0549 12/09/19 0220  BILITOT 1.2 1.5* 1.0  AST 13* 11* 10*  ALT 11 10 12   ALKPHOS 112 108 96  PROT 7.3 6.1* 6.1*  ALBUMIN 2.3* 1.8* 1.8*    Assessment and Plan: Diverticular abscess S/p Drain placement 10/26 in IR Will follow   Electronically Signed: Ascencion Dike, PA-C 12/10/2019, 11:55 AM   I spent a total of 15 Minutes at the the patient's bedside AND on the patient's hospital floor or unit, greater than 50% of which was counseling/coordinating care for abscess drain

## 2019-12-11 DIAGNOSIS — K659 Peritonitis, unspecified: Secondary | ICD-10-CM | POA: Diagnosis not present

## 2019-12-11 DIAGNOSIS — N179 Acute kidney failure, unspecified: Secondary | ICD-10-CM | POA: Diagnosis not present

## 2019-12-11 DIAGNOSIS — K651 Peritoneal abscess: Secondary | ICD-10-CM | POA: Diagnosis not present

## 2019-12-11 LAB — BASIC METABOLIC PANEL
Anion gap: 7 (ref 5–15)
BUN: 54 mg/dL — ABNORMAL HIGH (ref 8–23)
CO2: 25 mmol/L (ref 22–32)
Calcium: 10.4 mg/dL — ABNORMAL HIGH (ref 8.9–10.3)
Chloride: 109 mmol/L (ref 98–111)
Creatinine, Ser: 2.6 mg/dL — ABNORMAL HIGH (ref 0.44–1.00)
GFR, Estimated: 20 mL/min — ABNORMAL LOW (ref >60)
Glucose, Bld: 95 mg/dL (ref 70–99)
Potassium: 4.5 mmol/L (ref 3.5–5.1)
Sodium: 141 mmol/L (ref 135–145)

## 2019-12-11 LAB — GLUCOSE, CAPILLARY
Glucose-Capillary: 92 mg/dL (ref 70–99)
Glucose-Capillary: 92 mg/dL (ref 70–99)
Glucose-Capillary: 111 mg/dL — ABNORMAL HIGH (ref 70–99)

## 2019-12-11 LAB — CBC
HCT: 32.8 % — ABNORMAL LOW (ref 36.0–46.0)
Hemoglobin: 9.8 g/dL — ABNORMAL LOW (ref 12.0–15.0)
MCH: 31.4 pg (ref 26.0–34.0)
MCHC: 29.9 g/dL — ABNORMAL LOW (ref 30.0–36.0)
MCV: 105.1 fL — ABNORMAL HIGH (ref 80.0–100.0)
Platelets: 368 10*3/uL (ref 150–400)
RBC: 3.12 MIL/uL — ABNORMAL LOW (ref 3.87–5.11)
RDW: 13.4 % (ref 11.5–15.5)
WBC: 20.8 10*3/uL — ABNORMAL HIGH (ref 4.0–10.5)
nRBC: 0 % (ref 0.0–0.2)

## 2019-12-11 LAB — AEROBIC/ANAEROBIC CULTURE W GRAM STAIN (SURGICAL/DEEP WOUND)

## 2019-12-11 MED ORDER — SODIUM CHLORIDE 0.9 % IV SOLN
2.0000 g | INTRAVENOUS | Status: DC
  Administered 2019-12-11 – 2019-12-15 (×5): 2 g via INTRAVENOUS
  Filled 2019-12-11: qty 20
  Filled 2019-12-11: qty 2
  Filled 2019-12-11: qty 20
  Filled 2019-12-11 (×2): qty 2

## 2019-12-11 MED ORDER — METRONIDAZOLE IN NACL 5-0.79 MG/ML-% IV SOLN
500.0000 mg | Freq: Three times a day (TID) | INTRAVENOUS | Status: DC
  Administered 2019-12-11 – 2019-12-15 (×13): 500 mg via INTRAVENOUS
  Filled 2019-12-11 (×13): qty 100

## 2019-12-11 NOTE — Progress Notes (Signed)
RN aware of order to d/c central line and will complete.

## 2019-12-11 NOTE — Progress Notes (Signed)
NAME:  Phyllis Todd, MRN:  [id number], DOB:  [birth year], LOS: 4 ADMISSION DATE:  12/06/2019, CONSULTATION DATE: 12/07/2019 REFERRING MD: Ottis Stain, CHIEF COMPLAINT: Left lower quadrant abdominal pain  Brief History   69 yo female with Lt lower quadrant abdominal pain for 1 week.  Found to have intra-abdominal abscess on CT abd/pelvis.  Seen by surgery and IR.  PCCM asked to admit to ICU.  Past Medical History  Combined CHF, Morbid obesity, HTN, Fatty liver  Significant Hospital Events   10/25 >> Admitted to [facility] 10/26 >> Drain placed into abscess, increased pressor needs 10/29 >> Off pressors 10/30 >> transfer to floor bed  Consults:  Surgery, IR  Procedures:  10/26 >> CT-guided drain placement   Significant Diagnostic Tests:  CT A/P (10/25) >> Findings suggestive of extensive mid ileal enteritis and possible sigmoid colonic diverticulitis. There is an adjacent multilocular fluid collection consistent with an intra-abdominal abscess measuring 11.0 x 5.9 x 9.3 cm. Small amount of pneumoperitoneum Echo (10/26) >> EF 55 to 60%  Micro Data:  COVID 10/25 >> negative Blood 1025 >> Abdominal abscess 10/26 >> E coli pan sensitive, Streptococcus anginosis intermediate to PCN  MRSA PCR 10/28 >> negative   Antimicrobials:  Cefepime 10/25 Metronidazole 10/25  Fluconazole 10/25 - 10/27 Zosyn 10/25 >> 10/30 Eraxis 10/27 >> 10/30 Rocephin 10/30 >> Flagyl 10/30 >>   Interim history/subjective:  Denies chest pain or abdominal pain.  Objective   Blood pressure 126/72, pulse 80, temperature 98.6 F (37 C), temperature source Oral, resp. rate (!) 29, height 5\' 4"  (1.626 m), weight 134.1 kg, SpO2 93 %.        Intake/Output Summary (Last 24 hours) at 12/11/2019 0833 Last data filed at 12/11/2019 0600 Gross per 24 hour  Intake 1180 ml  Output 1627 ml  Net -447 ml   Filed Weights   12/06/19 1919 12/07/19 0136 12/08/19 0500  Weight: (!) 152 kg 129.2 kg 134.1 kg   Examination:   General - alert Eyes - pupils reactive ENT - no sinus tenderness, no stridor Cardiac - regular rate/rhythm, no murmur Chest - equal breath sounds b/l, no wheezing or rales Abdomen - soft, non tender, + bowel sounds, drain in LLQ Extremities - 1+ edema Skin - no rashes Neuro - normal strength, moves extremities, follows commands Psych - normal mood and behavior  Resolved Hospital Problem list   Septic shock, Hyperkalemia  Assessment & Plan:   Peritonitis from intra-abdominal abscess. - likely from perforated sigmoid diverticulum - s/p drain by IR - E coli and Streptococcus in abscess culture >> change ABx to rocephin and flagly - d/c eraxis - surgery following; continue conservative management for now and monitor need for surgical intervention  AKI from ATN. CKD 3a. - baseline creatinine 1.6 - renal fx improving - f/u BMET  Chronic combined CHF, HTN. - normal EF on current echo - hold outpt coreg, losartan, spironolactone, lasix for now  Anemia of critical illness. Leukocytosis in setting of sepsis - improving. - f/u CBC   Best practice:  Diet: full liquid DVT prophylaxis: Heparin SQ GI prophylaxis: N/A Mobility: OOB to chair Code Status: Full Disposition: to med-surg floor bed; will ask [facility] to assume care from 10/31 and PCCM off  Labs    CMP Latest Ref Rng & Units 12/11/2019 12/10/2019 12/09/2019  Glucose 70 - 99 mg/dL 95 76 144(H)  BUN 8 - 23 mg/dL 54(H) 70(H) 82(H)  Creatinine 0.44 - 1.00 mg/dL 2.60(H) 3.12(H) 3.94(H)  Sodium 135 -  145 mmol/L 141 142 138  Potassium 3.5 - 5.1 mmol/L 4.5 4.6 4.6  Chloride 98 - 111 mmol/L 109 110 107  CO2 22 - 32 mmol/L 25 25 21(L)  Calcium 8.9 - 10.3 mg/dL 10.4(H) 10.3 10.5(H)  Total Protein 6.5 - 8.1 g/dL - - 6.1(L)  Total Bilirubin 0.3 - 1.2 mg/dL - - 1.0  Alkaline Phos 38 - 126 U/L - - 96  AST 15 - 41 U/L - - 10(L)  ALT 0 - 44 U/L - - 12    CBC Latest Ref Rng & Units 12/11/2019 12/10/2019 12/09/2019  WBC 4.0 -  10.5 K/uL 20.8(H) 20.0(H) 34.4(H)  Hemoglobin 12.0 - 15.0 g/dL 9.8(L) 9.1(L) 9.1(L)  Hematocrit 36 - 46 % 32.8(L) 30.1(L) 29.9(L)  Platelets 150 - 400 K/uL 368 364 424(H)   ABG    Component Value Date/Time   PHART 7.389 07/12/2013 1438   PCO2ART 50.6 (H) 07/12/2013 1438   PO2ART 51.0 (L) 07/12/2013 1438   HCO3 30.5 (H) 07/12/2013 1438   TCO2 32 07/12/2013 1438   O2SAT 85.0 07/12/2013 1438    CBG (last 3)  Recent Labs    12/10/19 1913 12/10/19 2320 12/11/19 0311  GLUCAP 98 89 92    Signature:  Chesley Mires, MD [place] Pager - [phone] 12/11/2019, 8:44 AM

## 2019-12-11 NOTE — Progress Notes (Signed)
Subjective/Chief Complaint: Off all pressors yesterday - BP stable Tolerating CLD without nausea or emesis Reports multiple BMs yesterday/overnight (7 documented) Reports pain is improving and she really just has pain with movement.   Objective: Vital signs in last 24 hours: Temp:  [98.6 F (37 C)-99.1 F (37.3 C)] 98.6 F (37 C) (10/29 1114) Pulse Rate:  [69-89] 80 (10/30 0600) Resp:  [19-33] 29 (10/30 0600) BP: (105-140)/(45-90) 126/72 (10/30 0600) SpO2:  [90 %-98 %] 93 % (10/30 0600) Last BM Date: 12/10/19  Intake/Output from previous day: 10/29 0701 - 10/30 0700 In: 1180 [P.O.:1040; I.V.:10] Out: 1627 [Urine:1525; Drains:102] Intake/Output this shift: No intake/output data recorded.  Gen:  Alert, NAD, non-toxic appearing female who appears states age Card:  Regular rate and rhythm, heart murmur present Pulm:  Normal effort on nasal cannula, clear to auscultation bilaterally Abd: Soft, obese, JP in place with bilious drainage in bulb this is a change from purulent white milky drainage the last 48 hours. Drain is being flushed q 8h by nursing. Skin: warm and dry, skin breakdown inframammary fold and groin Psych: A&Ox3   Lab Results:  Recent Labs    12/10/19 0515 12/11/19 0425  WBC 20.0* 20.8*  HGB 9.1* 9.8*  HCT 30.1* 32.8*  PLT 364 368   BMET Recent Labs    12/10/19 0515 12/11/19 0425  NA 142 141  K 4.6 4.5  CL 110 109  CO2 25 25  GLUCOSE 76 95  BUN 70* 54*  CREATININE 3.12* 2.60*  CALCIUM 10.3 10.4*   PT/INR No results for input(s): LABPROT, INR in the last 72 hours. ABG No results for input(s): PHART, HCO3 in the last 72 hours.  Invalid input(s): PCO2, PO2  Studies/Results: No results found.  Anti-infectives: Anti-infectives (From admission, onward)   Start     Dose/Rate Route Frequency Ordered Stop   12/11/19 0830  cefTRIAXone (ROCEPHIN) 2 g in sodium chloride 0.9 % 100 mL IVPB        2 g 200 mL/hr over 30 Minutes Intravenous Every 24  hours 12/11/19 0738     12/11/19 0830  metroNIDAZOLE (FLAGYL) IVPB 500 mg        500 mg 100 mL/hr over 60 Minutes Intravenous Every 8 hours 12/11/19 0738     12/09/19 1000  anidulafungin (ERAXIS) 100 mg in sodium chloride 0.9 % 100 mL IVPB        100 mg 78 mL/hr over 100 Minutes Intravenous Every 24 hours 12/08/19 1050 12/15/19 0959   12/08/19 1145  anidulafungin (ERAXIS) 200 mg in sodium chloride 0.9 % 200 mL IVPB        200 mg 78 mL/hr over 200 Minutes Intravenous  Once 12/08/19 1050 12/08/19 1633   12/07/19 0600  metroNIDAZOLE (FLAGYL) IVPB 500 mg  Status:  Discontinued        500 mg 100 mL/hr over 60 Minutes Intravenous Every 8 hours 12/07/19 0030 12/07/19 0122   12/07/19 0600  piperacillin-tazobactam (ZOSYN) IVPB 2.25 g  Status:  Discontinued        2.25 g 100 mL/hr over 30 Minutes Intravenous Every 8 hours 12/07/19 0122 12/11/19 0738   12/07/19 0130  fluconazole (DIFLUCAN) IVPB 200 mg  Status:  Discontinued        200 mg 100 mL/hr over 60 Minutes Intravenous Every 24 hours 12/07/19 0030 12/08/19 1050   12/06/19 2200  ceFEPIme (MAXIPIME) 2 g in sodium chloride 0.9 % 100 mL IVPB        2  g 200 mL/hr over 30 Minutes Intravenous  Once 12/06/19 2146 12/06/19 2330   12/06/19 2200  metroNIDAZOLE (FLAGYL) IVPB 500 mg        500 mg 100 mL/hr over 60 Minutes Intravenous  Once 12/06/19 2146 12/06/19 2358      Assessment/Plan: Morbid obesity HTN combined systolic and diastolic heart failure - last echo 2107 EF 55-60%; echo ordered this AM await final read Pulmonary HTN OSA NASH, non-alcoholic fatty liver disease  Acute renal failure with history of CKD3 - BUN 102, creatinine 5.26, per chart review baseline Cr around 1.6 Hyperkalemia 5.5  Septic shock due to below - per CCM, off pressors Acute sigmoid diverticulitis with perforation and 11.0 x 5.9 x 9.3 cm abscess - S/P IR drainage of abscess 10/26.  Cx w. Streptococcus anginosis (resistant to erythromycin, PCN), E.coli  (pansensitive), bacteroides fragilis.  - afebrile, WBC down to 20 from 34 - allow full liquids and monitor drain output - In the setting of multiple above medical comorbidities perioperative morbidity and mortality are high. According to NSQIP, this patient has a 34.8% likelihood of serious perioperative complication, 9.2% risk of cardiac complication, and 33.0% chance of perioperative mortality. I discussed the possibility of surgery with the patient if non-operative measures are unsuccessful or her condition worsens and she states if she was going to die without an operation she would want to go to surgery.  - Greatly appreciate CCM's care of this patient    FEN: FLD, IVF ID: Zosyn, fluconazole 10/25 >>  VTE: SCD's, SQH Foley: would like to D/C foley today (good UOP), discussed with CCM Dispo: ICU, stable for discharge to SDU/progressive care  Will eventually need follow-up CT scan to evaluate abscess/sigmoid perforation.  We hope to manage this episode with the drain and then have her follow-up with one of our colorectal surgeons for possible single-stage colon resection with anastomosis to avoid the need for colostomy.   LOS: 4 days    [name] 12/11/2019

## 2019-12-11 NOTE — Progress Notes (Signed)
Pharmacy Antibiotic Note  Phyllis Todd is a 69 y.o. female admitted on 12/06/2019 with sepsis and intra-abdominal abscess s/p drain placement 10/26.  Pharmacy has been consulted for Zosyn and Eraxis dosing. WBC elevated but trending down, afebrile. Acute on chronic renal failure.   Abscess culture has grown out a mixed isolates of strep anginosis, e. Coli, and bacteriodes fragilis. However, the strep anginonis is intermediate to PCN. D/w Dr. Halford Chessman and we will optimize her abx to ceftriaxone and metronidazole. Surgery has been place on hold due to clinical improvement.   Plan: Ceftriaxone 2g IV q24 Flagyl 500mg  IV q8 Eraxis 100 mg q24h  Trend WBC, temp, renal function  Height: 5\' 4"  (162.6 cm) Weight: 134.1 kg (295 lb 10.2 oz) IBW/kg (Calculated) : 54.7  Temp (24hrs), Avg:98.9 F (37.2 C), Min:98.6 F (37 C), Max:99.1 F (37.3 C)  Recent Labs  Lab 12/07/19 0239 12/07/19 1829 12/08/19 0549 12/08/19 0634 12/08/19 1105 12/08/19 1115 12/09/19 0220 12/10/19 0515 12/11/19 0425  WBC 27.9*  --  39.5*  --   --   --  34.4* 20.0* 20.8*  CREATININE 5.26*   < > 4.58*  --  4.40*  --  3.94* 3.12* 2.60*  LATICACIDVEN 1.4  --   --  0.8  --  0.8  --   --   --    < > = values in this interval not displayed.    Estimated Creatinine Clearance: 28.7 mL/min (A) (by C-G formula based on SCr of 2.6 mg/dL (H)).    Allergies  Allergen Reactions  . Codeine Other (See Comments)    "makes me hyper"  . Demerol [Meperidine] Other (See Comments)    "sick"  . Lisinopril Cough  . Peanut-Containing Drug Products Hives and Swelling    "almonds"    Antimicrobials this admission: Zosyn 10/26 >>10/30 Ceftriaxone 10/30>> Flagyl 10/30>>   Eraxis 10/27 >> 11/2 stop date in  Microbiology results: 10/25 Bcx - ng x2d 10/26 abscess cx - pan sensitive E.coli, strep aginosis intermediate to PCN, bacteriodes fragilis beta lactamase positive 1028 MRSA PCR - neg  Onnie Boer, PharmD, BCIDP, AAHIVP,  CPP Infectious Disease Pharmacist 12/11/2019 7:49 AM

## 2019-12-12 DIAGNOSIS — N179 Acute kidney failure, unspecified: Secondary | ICD-10-CM

## 2019-12-12 DIAGNOSIS — K659 Peritonitis, unspecified: Secondary | ICD-10-CM

## 2019-12-12 DIAGNOSIS — I10 Essential (primary) hypertension: Secondary | ICD-10-CM

## 2019-12-12 DIAGNOSIS — A419 Sepsis, unspecified organism: Secondary | ICD-10-CM

## 2019-12-12 LAB — BASIC METABOLIC PANEL
Anion gap: 8 (ref 5–15)
BUN: 41 mg/dL — ABNORMAL HIGH (ref 8–23)
CO2: 23 mmol/L (ref 22–32)
Calcium: 10.3 mg/dL (ref 8.9–10.3)
Chloride: 111 mmol/L (ref 98–111)
Creatinine, Ser: 2.31 mg/dL — ABNORMAL HIGH (ref 0.44–1.00)
GFR, Estimated: 23 mL/min — ABNORMAL LOW (ref >60)
Glucose, Bld: 84 mg/dL (ref 70–99)
Potassium: 4.5 mmol/L (ref 3.5–5.1)
Sodium: 142 mmol/L (ref 135–145)

## 2019-12-12 LAB — CBC
HCT: 32.4 % — ABNORMAL LOW (ref 36.0–46.0)
Hemoglobin: 9.7 g/dL — ABNORMAL LOW (ref 12.0–15.0)
MCH: 31.9 pg (ref 26.0–34.0)
MCHC: 29.9 g/dL — ABNORMAL LOW (ref 30.0–36.0)
MCV: 106.6 fL — ABNORMAL HIGH (ref 80.0–100.0)
Platelets: 394 10*3/uL (ref 150–400)
RBC: 3.04 MIL/uL — ABNORMAL LOW (ref 3.87–5.11)
RDW: 13.3 % (ref 11.5–15.5)
WBC: 20.1 10*3/uL — ABNORMAL HIGH (ref 4.0–10.5)
nRBC: 0 % (ref 0.0–0.2)

## 2019-12-12 LAB — CULTURE, BLOOD (ROUTINE X 2)
Culture: NO GROWTH
Culture: NO GROWTH

## 2019-12-12 MED ORDER — HYDROMORPHONE HCL 1 MG/ML IJ SOLN: 0.5000 mg | INTRAMUSCULAR | Status: DC | PRN

## 2019-12-12 MED ORDER — HYDROCODONE-ACETAMINOPHEN 5-325 MG PO TABS
1.0000 | ORAL_TABLET | Freq: Four times a day (QID) | ORAL | Status: DC | PRN
  Administered 2019-12-12: 1 via ORAL
  Administered 2019-12-12: 2 via ORAL
  Filled 2019-12-12: qty 1
  Filled 2019-12-12: qty 2

## 2019-12-12 MED ORDER — ACETAMINOPHEN 325 MG PO TABS
650.0000 mg | ORAL_TABLET | Freq: Four times a day (QID) | ORAL | Status: DC | PRN
  Administered 2019-12-13 – 2019-12-17 (×12): 650 mg via ORAL
  Filled 2019-12-12 (×12): qty 2

## 2019-12-12 NOTE — Evaluation (Signed)
Physical Therapy Evaluation Patient Details Name: Phyllis Todd MRN: [id number] DOB: 1950-10-29 Today's Date: 12/12/2019   History of Present Illness  Patient is a 69 y/o female who presents with abdominal pain, diarrhea, fatigue, weakness. Found to have Acute sigmoid diverticulitis with perforation and 11.0 x 5.9 x 9.3 cm abscess leading to sepsis. Also with E.coli (pansensitive), bacteroides fragilis. s/p abscess drainge 10/26. PMH includes NICM, CHF, HTN, obesity,  Clinical Impression  Patient presents with generalized weakness, decreased activity tolerance, impaired balance and impaired mobility s/p above. Pt reports being independent for ADLs/IADLs and ambulation and lives with 59 y/o father. Reports falls. Today, pt anxious about mobility. Requires Mod A for bed mobility, Mod-max A for standing and Min A of 2 for taking a few steps to the chair. Sp02 remained >94% on RA. Encouraged increasing activity and using BSC as able to improve tolerance and strength. Would benefit from SNF to maximize independence and mobility prior to return home. Will follow acutely.    Follow Up Recommendations Supervision for mobility/OOB;SNF    Equipment Recommendations  Other (comment) (TBA)    Recommendations for Other Services       Precautions / Restrictions Precautions Precautions: Fall Restrictions Weight Bearing Restrictions: No      Mobility  Bed Mobility Overal bed mobility: Needs Assistance Bed Mobility: Rolling;Sidelying to Sit Rolling: Min assist Sidelying to sit: Mod assist;HOB elevated       General bed mobility comments: Rolling to left x2 with use of rail. Assist to elevate trunk to get to EOB.    Transfers Overall transfer level: Needs assistance Equipment used: Rolling walker (2 wheeled) Transfers: Sit to/from [facility] Sit to Stand: Mod assist;Max assist Stand pivot transfers: Min assist;+2 safety/equipment       General transfer comment: Assist of 2  to power to standing with use of momentum and 2 attempts to get upright. Cues for hand placement. Able to take a few steps to get to chair with flexed trunk and shuffling like gait pattern. Bil knee instability but no buckling.  Ambulation/Gait             General Gait Details: Deferred  Stairs            Wheelchair Mobility    Modified Rankin (Stroke Patients Only)       Balance Overall balance assessment: Needs assistance;History of Falls Sitting-balance support: Feet supported;Single extremity supported Sitting balance-Leahy Scale: Fair Sitting balance - Comments: Close min guard. total A to donn socks.   Standing balance support: During functional activity Standing balance-Leahy Scale: Poor Standing balance comment: Requires UE support and external support due to LE weakness.                             Pertinent Vitals/Pain Pain Assessment: 0-10 Pain Score: 5  Pain Location: jaw, bil feet Pain Descriptors / Indicators: Aching;Sore Pain Intervention(s): Monitored during session;Repositioned;Limited activity within patient's tolerance    Home Living Family/patient expects to be discharged to:: Private residence Living Arrangements:  (64 y/o father) Available Help at Discharge: Family;Available 24 hours/day Type of Home: House Home Access: Level entry     Home Layout: One level (4 inch step into living room) Home Equipment: Shower seat - built in;Walker - 2 wheels      Prior Function Level of Independence: Independent         Comments: Has a cleaning lady. Drives. Does ADLs. Does not cook. Reports falls.  Hand Dominance   Dominant Hand: Right    Extremity/Trunk Assessment   Upper Extremity Assessment Upper Extremity Assessment: Defer to OT evaluation    Lower Extremity Assessment Lower Extremity Assessment: Generalized weakness (Grossly ~2+/5 throughout)    Cervical / Trunk Assessment Cervical / Trunk Assessment: Normal   Communication   Communication: No difficulties  Cognition Arousal/Alertness: Awake/alert Behavior During Therapy: Anxious Overall Cognitive Status: Within Functional Limits for tasks assessed                                        General Comments General comments (skin integrity, edema, etc.): Sp02 remained >94% on RA during session with activity, kept 02 off.    Exercises     Assessment/Plan    PT Assessment Patient needs continued PT services  PT Problem List Decreased strength;Decreased mobility;Obesity;Decreased range of motion;Decreased activity tolerance;Pain;Decreased balance       PT Treatment Interventions Therapeutic activities;Gait training;Therapeutic exercise;Patient/family education;Balance training;Functional mobility training;DME instruction    PT Goals (Current goals can be found in the Care Plan section)  Acute Rehab PT Goals Patient Stated Goal: to get better PT Goal Formulation: With patient Time For Goal Achievement: 12/26/19 Potential to Achieve Goals: Good    Frequency Min 3X/week   Barriers to discharge Decreased caregiver support lives with 57 y/o father    Co-evaluation               AM-PAC PT "6 Clicks" Mobility  Outcome Measure Help needed turning from your back to your side while in a flat bed without using bedrails?: A Little Help needed moving from lying on your back to sitting on the side of a flat bed without using bedrails?: A Little Help needed moving to and from a bed to a chair (including a wheelchair)?: A Little Help needed standing up from a chair using your arms (e.g., wheelchair or bedside chair)?: A Lot Help needed to walk in hospital room?: A Lot Help needed climbing 3-5 steps with a railing? : Total 6 Click Score: 14    End of Session Equipment Utilized During Treatment: Gait belt Activity Tolerance: Patient limited by fatigue Patient left: in chair;with call bell/phone within reach;with  nursing/sitter in room Nurse Communication: Mobility status PT Visit Diagnosis: Muscle weakness (generalized) (M62.81);Unsteadiness on feet (R26.81);Difficulty in walking, not elsewhere classified (R26.2);Pain Pain - Right/Left:  (bil) Pain - part of body: Ankle and joints of foot    Time: 4562-5638 PT Time Calculation (min) (ACUTE ONLY): 35 min   Charges:   PT Evaluation $PT Eval Moderate Complexity: 1 Mod PT Treatments $Therapeutic Activity: 8-22 mins        Marisa Severin, PT, DPT Acute Rehabilitation Services Pager [phone] Office [phone]      Phyllis Todd 12/12/2019, 8:59 AM

## 2019-12-12 NOTE — Progress Notes (Signed)
PROGRESS NOTE    Phyllis Todd  FXT:[id number] DOB: [birth year] DOA: 12/06/2019 PCP: Rubbie Battiest, RN (Inactive)   Brief Narrative: Phyllis Todd is a 69 y.o. female with a history of combined systolic heart failure, obesity, OSA, NASH. Patient presented secondary to worsening LLQ abdominal pain and found to have an intra abdominal abscess and sepsis. She required ICU admission for septic shock and need for vasopressor support. She has been managed with a percutaneous drain and antibiotics.   Assessment & Plan:   Principal Problem:   Severe sepsis with septic shock [facility]) Active Problems:   Essential hypertension   Morbid obesity ([place])   Intra-abdominal abscess (HCC)   AKI (acute kidney injury) ([place])   Intra-abdominal abscess Likely secondary to perforated diverticulum. Patient underwent percutaneous drain placement by IR on 10/26. Cultures significant for multiple species: Streptococcus anginosis, Bacteroides fragilis/ovatus, E. Coli. Patient has been managed on Zosyn, Fluconazole, Anidulafungin throughout admission and has now transitioned to Ceftriaxone and Flagyl. -Continue Ceftriaxone and Flagyl  Severe sepsis Present on admission with associated AKI from ATN. Initially managed with multiple fluid boluses and IV antibiotics. Progressed to septic shock requiring Neosynephrine > Levophed/vasopressin drip in the ICU. Secondary to above diagnoses and patient treated with a percutaneous drain and antibiotics. Physiology resolved.  AKI on CKD stage IIIa Baseline creatinine of 1.6 from almost two years ago. Secondary to ATN. Improving back towards baseline. -Daily BMP  Chronic combined systolic/diastolic heart failure Last EF of 55-60% on Transthoracic Echocardiogram from 10/26. She has some LE edema but overall does not appear to have an acute exacerbation. She is on Lasix and spironolactone as an outpatient as an outpatient. Lasix held this admission in setting of AKI -Continue to  hold lasix for now  Essential hypertension Normotensive currently. Initially hypotensive on admission.   Anemia Unknown etiology. No recent baseline. Patient is non-menstruating. Macrocytosis noted on CBC which is chronic. Normal folate and vitamin B12. Currently stable.  Jaw pain Likely TMJ. Cannot use NSAIDs -Continue analgesics  Hypoalbuminemia Significant and chronic. Acutely appears to be worse likely in setting of acute infection. -Dietitian consult  NASH Some fluid overload in setting of above.  Obesity Body mass index is 50.75 kg/m.    DVT prophylaxis: Heparin subq Code Status:   Code Status: Full Code Family Communication: None at bedside Disposition Plan: Discharge to SNF when medically stable likely in several days   Consultants:   General surgery  PCCM  Interventional radiology  Procedures:   CVC (10/26 >>  CT GUIDED DRAINAGE OF DIVERTICULAR ABSCESS (10/26)  Findings: 12 Fr drain placed in pelvic diverticular abscess with return of foul-smelling feculent fluid. Fluid sample sent for culture analysis. Drain attached to suction bulb drainage  TRANSTHORACIC ECHOCARDIOGRAM (12/07/2019) IMPRESSIONS    1. Left ventricular ejection fraction, by estimation, is 55 to 60%. The  left ventricle has normal function. The left ventricle has no regional  wall motion abnormalities. Left ventricular diastolic parameters were  normal.  2. Right ventricular systolic function is normal. The right ventricular  size is mildly enlarged. There is normal pulmonary artery systolic  pressure.  3. Left atrial size was mildly dilated.  4. The mitral valve is normal in structure. No evidence of mitral valve  regurgitation. No evidence of mitral stenosis.  5. The aortic valve is normal in structure. Aortic valve regurgitation is  not visualized. No aortic stenosis is present.  6. The inferior vena cava is dilated in size with >50% respiratory  variability, suggesting  right atrial pressure of 8 mmHg.   Comparison(s): Prior images reviewed side by side. The left ventricular  function is unchanged.   Antimicrobials:  Eraxis  Ceftriaxone   Zosyn   Subjective: Some jaw and leg pain.  Objective: Vitals:   12/11/19 2221 12/12/19 0259 12/12/19 0535 12/12/19 0952  BP: 139/69 (!) 141/79 (!) 144/84 (!) 142/73  Pulse: 96 86 92 83  Resp: 20 15 16 20   Temp: 98.1 F (36.7 C) 98.4 F (36.9 C) 98.6 F (37 C) (!) 97.5 F (36.4 C)  TempSrc: Oral Oral Oral Axillary  SpO2: 97% 97% 96% 93%  Weight:      Height:        Intake/Output Summary (Last 24 hours) at 12/12/2019 1029 Last data filed at 12/12/2019 0924 Gross per 24 hour  Intake 822.06 ml  Output 1025 ml  Net -202.94 ml   Filed Weights   12/06/19 1919 12/07/19 0136 12/08/19 0500  Weight: (!) 152 kg 129.2 kg 134.1 kg    Examination:  General exam: Appears calm and comfortable Respiratory system: Clear to auscultation. Respiratory effort normal. Cardiovascular system: S1 & S2 heard, RRR. No murmurs, rubs, gallops or clicks. Gastrointestinal system: Abdomen is obese, nondistended, soft and nontender. Drain located in lower abdomen with clean overlying dressing. No organomegaly or masses felt. Normal bowel sounds heard. Central nervous system: Alert and oriented. No focal neurological deficits. Musculoskeletal: BLE edema. No calf tenderness Skin: No cyanosis. No rashes Psychiatry: Judgement and insight appear normal. Mood & affect appropriate.     Data Reviewed: I have personally reviewed following labs and imaging studies  CBC Lab Results  Component Value Date   WBC 20.1 (H) 12/12/2019   RBC 3.04 (L) 12/12/2019   HGB 9.7 (L) 12/12/2019   HCT 32.4 (L) 12/12/2019   MCV 106.6 (H) 12/12/2019   MCH 31.9 12/12/2019   PLT 394 12/12/2019   MCHC 29.9 (L) 12/12/2019   RDW 13.3 12/12/2019   LYMPHSABS 1.7 12/09/2019   MONOABS 1.5 (H) 12/09/2019   EOSABS 0.1 12/09/2019   BASOSABS 0.1  37/16/9678     Last metabolic panel Lab Results  Component Value Date   NA 142 12/12/2019   K 4.5 12/12/2019   CL 111 12/12/2019   CO2 23 12/12/2019   BUN 41 (H) 12/12/2019   CREATININE 2.31 (H) 12/12/2019   GLUCOSE 84 12/12/2019   GFRNONAA 23 (L) 12/12/2019   GFRAA 38 (L) 03/13/2018   CALCIUM 10.3 12/12/2019   PHOS 4.5 12/07/2019   PROT 6.1 (L) 12/09/2019   ALBUMIN 1.8 (L) 12/09/2019   BILITOT 1.0 12/09/2019   ALKPHOS 96 12/09/2019   AST 10 (L) 12/09/2019   ALT 12 12/09/2019   ANIONGAP 8 12/12/2019    CBG (last 3)  Recent Labs    12/11/19 0311 12/11/19 0838 12/11/19 1144  GLUCAP 92 92 111*     GFR: Estimated Creatinine Clearance: 32.3 mL/min (A) (by C-G formula based on SCr of 2.31 mg/dL (H)).  Coagulation Profile: Recent Labs  Lab 12/07/19 1158  INR 1.2    Recent Results (from the past 240 hour(s))  Respiratory Panel by RT PCR (Flu A&B, Covid) - Nasopharyngeal Swab     Status: None   Collection Time: 12/06/19  8:10 PM   Specimen: Nasopharyngeal Swab  Result Value Ref Range Status   SARS Coronavirus 2 by RT PCR NEGATIVE NEGATIVE Final    Comment: (NOTE) SARS-CoV-2 target nucleic acids are NOT DETECTED.  The SARS-CoV-2 RNA is generally detectable  in upper respiratoy specimens during the acute phase of infection. The lowest concentration of SARS-CoV-2 viral copies this assay can detect is 131 copies/mL. A negative result does not preclude SARS-Cov-2 infection and should not be used as the sole basis for treatment or other patient management decisions. A negative result may occur with  improper specimen collection/handling, submission of specimen other than nasopharyngeal swab, presence of viral mutation(s) within the areas targeted by this assay, and inadequate number of viral copies (<131 copies/mL). A negative result must be combined with clinical observations, patient history, and epidemiological information. The expected result is Negative.  Fact  Sheet for Patients:  PinkCheek.be  Fact Sheet for Healthcare Providers:  GravelBags.it  This test is no t yet approved or cleared by the [place] FDA and  has been authorized for detection and/or diagnosis of SARS-CoV-2 by FDA under an Emergency Use Authorization (EUA). This EUA will remain  in effect (meaning this test can be used) for the duration of the COVID-19 declaration under Section 564(b)(1) of the Act, 21 U.S.C. section 360bbb-3(b)(1), unless the authorization is terminated or revoked sooner.     Influenza A by PCR NEGATIVE NEGATIVE Final   Influenza B by PCR NEGATIVE NEGATIVE Final    Comment: (NOTE) The Xpert Xpress SARS-CoV-2/FLU/RSV assay is intended as an aid in  the diagnosis of influenza from Nasopharyngeal swab specimens and  should not be used as a sole basis for treatment. Nasal washings and  aspirates are unacceptable for Xpert Xpress SARS-CoV-2/FLU/RSV  testing.  Fact Sheet for Patients: PinkCheek.be  Fact Sheet for Healthcare Providers: GravelBags.it  This test is not yet approved or cleared by the [place] FDA and  has been authorized for detection and/or diagnosis of SARS-CoV-2 by  FDA under an Emergency Use Authorization (EUA). This EUA will remain  in effect (meaning this test can be used) for the duration of the  Covid-19 declaration under Section 564(b)(1) of the Act, 21  U.S.C. section 360bbb-3(b)(1), unless the authorization is  terminated or revoked. Performed at [facility], [place] [address] [place], [place] [address]   Blood culture (routine x 2)     Status: None   Collection Time: 12/06/19 10:38 PM   Specimen: BLOOD  Result Value Ref Range Status   Specimen Description BLOOD LEFT ANTECUBITAL  Final   Special Requests   Final    BOTTLES DRAWN AEROBIC AND ANAEROBIC Blood Culture results may not be optimal due  to an inadequate volume of blood received in culture bottles   Culture   Final    NO GROWTH 5 DAYS Performed at [facility], [place] [address], [place], [place] [address]    Report Status 12/12/2019 FINAL  Final  Blood culture (routine x 2)     Status: None   Collection Time: 12/06/19 10:44 PM   Specimen: BLOOD RIGHT HAND  Result Value Ref Range Status   Specimen Description BLOOD RIGHT HAND  Final   Special Requests   Final    BOTTLES DRAWN AEROBIC AND ANAEROBIC Blood Culture results may not be optimal due to an inadequate volume of blood received in culture bottles   Culture   Final    NO GROWTH 5 DAYS Performed at [facility], [place] [address], [place], [place] [address]    Report Status 12/12/2019 FINAL  Final  Aerobic/Anaerobic Culture (surgical/deep wound)     Status: None   Collection Time: 12/07/19  3:18 PM   Specimen: Abscess  Result Value Ref Range Status   Specimen Description ABSCESS  Final   Special Requests DIVERTICULAR  Final   Gram Stain   Final    RARE WBC PRESENT, PREDOMINANTLY PMN ABUNDANT GRAM NEGATIVE RODS ABUNDANT GRAM POSITIVE COCCI IN PAIRS IN CHAINS FEW GRAM POSITIVE RODS    Culture   Final    FEW ESCHERICHIA COLI ABUNDANT STREPTOCOCCUS ANGINOSIS ABUNDANT BACTEROIDES FRAGILIS ABUNDANT BACTEROIDES OVATUS BETA LACTAMASE POSITIVE Performed at [facility], [place] [address], [place], [place] [address]    Report Status 12/11/2019 FINAL  Final   Organism ID, Bacteria ESCHERICHIA COLI  Final   Organism ID, Bacteria STREPTOCOCCUS ANGINOSIS  Final      Susceptibility   Escherichia coli - MIC*    AMPICILLIN 4 SENSITIVE Sensitive     CEFAZOLIN <=4 SENSITIVE Sensitive     CEFEPIME <=0.12 SENSITIVE Sensitive     CEFTAZIDIME <=1 SENSITIVE Sensitive     CEFTRIAXONE <=0.25 SENSITIVE Sensitive     CIPROFLOXACIN <=0.25 SENSITIVE Sensitive     GENTAMICIN <=1 SENSITIVE Sensitive     IMIPENEM <=0.25 SENSITIVE Sensitive     TRIMETH/SULFA <=20  SENSITIVE Sensitive     AMPICILLIN/SULBACTAM <=2 SENSITIVE Sensitive     PIP/TAZO <=4 SENSITIVE Sensitive     * FEW ESCHERICHIA COLI   Streptococcus anginosis - MIC*    PENICILLIN 0.25 INTERMEDIATE Intermediate     CEFTRIAXONE 0.5 SENSITIVE Sensitive     ERYTHROMYCIN >=8 RESISTANT Resistant     LEVOFLOXACIN <=0.25 SENSITIVE Sensitive     VANCOMYCIN 0.25 SENSITIVE Sensitive     * ABUNDANT STREPTOCOCCUS ANGINOSIS  MRSA PCR Screening     Status: None   Collection Time: 12/09/19  6:26 PM   Specimen: Nasopharyngeal  Result Value Ref Range Status   MRSA by PCR NEGATIVE NEGATIVE Final    Comment:        The GeneXpert MRSA Assay (FDA approved for NASAL specimens only), is one component of a comprehensive MRSA colonization surveillance program. It is not intended to diagnose MRSA infection nor to guide or monitor treatment for MRSA infections. Performed at [facility], [place] [address], [place], [place] [address]         Radiology Studies: No results found.      Scheduled Meds: . Chlorhexidine Gluconate Cloth  6 each Topical Daily  . guaiFENesin  600 mg Oral BID  . heparin  5,000 Units Subcutaneous Q8H  . mouth rinse  15 mL Mouth Rinse BID  . sodium chloride flush  10-40 mL Intracatheter Q12H  . sodium chloride flush  5 mL Intracatheter Q8H   Continuous Infusions: . sodium chloride 250 mL (12/11/19 1713)  . cefTRIAXone (ROCEPHIN)  IV 2 g (12/12/19 0852)  . metronidazole 500 mg (12/12/19 0936)     LOS: 5 days     Cordelia Poche, MD [facility] 12/12/2019, 10:29 AM  If 7PM-7AM, please contact night-coverage [email or web address]

## 2019-12-12 NOTE — Progress Notes (Addendum)
Subjective/Chief Complaint:  Diet full liquids - she is tolerating, but not very hungry.  She is having BM's.  She complains of ankle pain and jaw pain.  Objective: Vital signs in last 24 hours: Temp:  [97.9 F (36.6 C)-98.7 F (37.1 C)] 98.6 F (37 C) (10/31 0535) Pulse Rate:  [73-96] 92 (10/31 0535) Resp:  [14-30] 16 (10/31 0535) BP: (110-144)/(52-86) 144/84 (10/31 0535) SpO2:  [92 %-97 %] 96 % (10/31 0535) Last BM Date: 12/11/19  Intake/Output from previous day: 10/30 0701 - 10/31 0700 In: 1042.1 [P.O.:680; I.V.:15; IV Piggyback:347.1] Out: 1260 [Urine:1225; Drains:35] Intake/Output this shift: No intake/output data recorded.  Gen:  Alert, NAD, non-toxic appearing female who appears states age Card:  Regular rate and rhythm, heart murmur present Pulm:  Normal effort on nasal cannula, clear to auscultation bilaterally Abd: Soft, obese,   JP in place - straw colored, 35 cc recorded last 24 hours Skin: warm and dry, skin breakdown inframammary fold and groin Extremities:  I see no swelling of her ankles  Lab Results:  Recent Labs    12/11/19 0425 12/12/19 0309  WBC 20.8* 20.1*  HGB 9.8* 9.7*  HCT 32.8* 32.4*  PLT 368 394   BMET Recent Labs    12/11/19 0425 12/12/19 0309  NA 141 142  K 4.5 4.5  CL 109 111  CO2 25 23  GLUCOSE 95 84  BUN 54* 41*  CREATININE 2.60* 2.31*  CALCIUM 10.4* 10.3   PT/INR No results for input(s): LABPROT, INR in the last 72 hours. ABG No results for input(s): PHART, HCO3 in the last 72 hours.  Invalid input(s): PCO2, PO2  Studies/Results: No results found.  Anti-infectives: Anti-infectives (From admission, onward)   Start     Dose/Rate Route Frequency Ordered Stop   12/11/19 0830  cefTRIAXone (ROCEPHIN) 2 g in sodium chloride 0.9 % 100 mL IVPB        2 g 200 mL/hr over 30 Minutes Intravenous Every 24 hours 12/11/19 0738     12/11/19 0830  metroNIDAZOLE (FLAGYL) IVPB 500 mg        500 mg 100 mL/hr over 60 Minutes  Intravenous Every 8 hours 12/11/19 0738     12/09/19 1000  anidulafungin (ERAXIS) 100 mg in sodium chloride 0.9 % 100 mL IVPB  Status:  Discontinued        100 mg 78 mL/hr over 100 Minutes Intravenous Every 24 hours 12/08/19 1050 12/11/19 0846   12/08/19 1145  anidulafungin (ERAXIS) 200 mg in sodium chloride 0.9 % 200 mL IVPB        200 mg 78 mL/hr over 200 Minutes Intravenous  Once 12/08/19 1050 12/08/19 1633   12/07/19 0600  metroNIDAZOLE (FLAGYL) IVPB 500 mg  Status:  Discontinued        500 mg 100 mL/hr over 60 Minutes Intravenous Every 8 hours 12/07/19 0030 12/07/19 0122   12/07/19 0600  piperacillin-tazobactam (ZOSYN) IVPB 2.25 g  Status:  Discontinued        2.25 g 100 mL/hr over 30 Minutes Intravenous Every 8 hours 12/07/19 0122 12/11/19 0738   12/07/19 0130  fluconazole (DIFLUCAN) IVPB 200 mg  Status:  Discontinued        200 mg 100 mL/hr over 60 Minutes Intravenous Every 24 hours 12/07/19 0030 12/08/19 1050   12/06/19 2200  ceFEPIme (MAXIPIME) 2 g in sodium chloride 0.9 % 100 mL IVPB        2 g 200 mL/hr over 30 Minutes Intravenous  Once  12/06/19 2146 12/06/19 2330   12/06/19 2200  metroNIDAZOLE (FLAGYL) IVPB 500 mg        500 mg 100 mL/hr over 60 Minutes Intravenous  Once 12/06/19 2146 12/06/19 2358      Assessment/Plan: Morbid obesity - BMI - 50.7 HTN combined systolic and diastolic heart failure -   Echo - 12/11/2019 - EF 55-60%;  Pulmonary HTN OSA NASH, non-alcoholic fatty liver disease  Acute renal failure with history of CKD3 -   Creatinine - 2.3 - 12/12/2019  Septic shock - resolved Acute sigmoid diverticulitis with perforation and 11.0 x 5.9 x 9.3 cm abscess  - S/P IR drainage of abscess 10/26.  Cx w. Streptococcus anginosis (resistant to erythromycin, PCN), E.coli (pansensitive), bacteroides fragilis.   - afebrile, WBC - 20,100 - 12/12/2019  - allow full liquids and monitor drain output   FEN: FLD, IVF ID:  Antibiotics - ceftriaxone, metronidazole VTE:  SCD's, SQH Dispo: Will need follow up CT scan of abscess at about 7 days of placement of drain   We hope to manage this episode with the drain and then have her follow-up with one of our colorectal surgeons for possible single-stage colon resection with anastomosis to avoid the need for colostomy.  Needs to ambulate - will order PT   LOS: 5 days    Shann Medal 12/12/2019

## 2019-12-13 LAB — CBC
HCT: 31.8 % — ABNORMAL LOW (ref 36.0–46.0)
Hemoglobin: 9.5 g/dL — ABNORMAL LOW (ref 12.0–15.0)
MCH: 31.9 pg (ref 26.0–34.0)
MCHC: 29.9 g/dL — ABNORMAL LOW (ref 30.0–36.0)
MCV: 106.7 fL — ABNORMAL HIGH (ref 80.0–100.0)
Platelets: 360 10*3/uL (ref 150–400)
RBC: 2.98 MIL/uL — ABNORMAL LOW (ref 3.87–5.11)
RDW: 13.4 % (ref 11.5–15.5)
WBC: 19.1 10*3/uL — ABNORMAL HIGH (ref 4.0–10.5)
nRBC: 0 % (ref 0.0–0.2)

## 2019-12-13 LAB — BASIC METABOLIC PANEL
Anion gap: 6 (ref 5–15)
BUN: 28 mg/dL — ABNORMAL HIGH (ref 8–23)
CO2: 25 mmol/L (ref 22–32)
Calcium: 10.2 mg/dL (ref 8.9–10.3)
Chloride: 111 mmol/L (ref 98–111)
Creatinine, Ser: 1.84 mg/dL — ABNORMAL HIGH (ref 0.44–1.00)
GFR, Estimated: 30 mL/min — ABNORMAL LOW (ref >60)
Glucose, Bld: 141 mg/dL — ABNORMAL HIGH (ref 70–99)
Potassium: 4.6 mmol/L (ref 3.5–5.1)
Sodium: 142 mmol/L (ref 135–145)

## 2019-12-13 MED ORDER — SODIUM CHLORIDE 0.9% FLUSH
10.0000 mL | Freq: Two times a day (BID) | INTRAVENOUS | Status: DC
  Administered 2019-12-13 – 2019-12-16 (×5): 10 mL

## 2019-12-13 MED ORDER — SODIUM CHLORIDE 0.9% FLUSH: 10.0000 mL | INTRAVENOUS | Status: DC | PRN

## 2019-12-13 NOTE — Care Management Important Message (Signed)
Important Message  Patient Details  Name: Phyllis Todd MRN: [id number] Date of Birth: [birth year]   Medicare Important Message Given:  Yes     Karlee Staff [address]ine Circle 12/13/2019, 4:14 PM

## 2019-12-13 NOTE — Progress Notes (Signed)
Subjective: Feels ok today.  Tolerating FLD.  Moving her bowels.  Intermittent nausea, but unsure what this is coming from.    ROS: See above, otherwise other systems negative  Objective: Vital signs in last 24 hours: Temp:  [97.5 F (36.4 C)-98.8 F (37.1 C)] 98 F (36.7 C) (11/01 0522) Pulse Rate:  [83-102] 94 (11/01 0522) Resp:  [15-20] 17 (11/01 0522) BP: (135-148)/(73-91) 148/85 (11/01 0522) SpO2:  [93 %-96 %] 95 % (11/01 0522) Weight:  [137.6 kg] 137.6 kg (11/01 0500) Last BM Date: 12/12/19  Intake/Output from previous day: 10/31 0701 - 11/01 0700 In: 900.3 [P.O.:480; I.V.:5; IV Piggyback:415.3] Out: 330 [Urine:300; Drains:30] Intake/Output this shift: No intake/output data recorded.  PE: Heart: regular Lungs: CTAB Abd: soft, NT, ND, obese, JP drain in place with serous type drainage.  30cc documented yesterday.  Lab Results:  Recent Labs    12/11/19 0425 12/12/19 0309  WBC 20.8* 20.1*  HGB 9.8* 9.7*  HCT 32.8* 32.4*  PLT 368 394   BMET Recent Labs    12/11/19 0425 12/12/19 0309  NA 141 142  K 4.5 4.5  CL 109 111  CO2 25 23  GLUCOSE 95 84  BUN 54* 41*  CREATININE 2.60* 2.31*  CALCIUM 10.4* 10.3   PT/INR No results for input(s): LABPROT, INR in the last 72 hours. CMP     Component Value Date/Time   NA 142 12/12/2019 0309   NA 140 01/14/2014 0921   K 4.5 12/12/2019 0309   CL 111 12/12/2019 0309   CO2 23 12/12/2019 0309   GLUCOSE 84 12/12/2019 0309   BUN 41 (H) 12/12/2019 0309   BUN 29 (H) 01/14/2014 0921   CREATININE 2.31 (H) 12/12/2019 0309   CALCIUM 10.3 12/12/2019 0309   PROT 6.1 (L) 12/09/2019 0220   ALBUMIN 1.8 (L) 12/09/2019 0220   AST 10 (L) 12/09/2019 0220   ALT 12 12/09/2019 0220   ALKPHOS 96 12/09/2019 0220   BILITOT 1.0 12/09/2019 0220   GFRNONAA 23 (L) 12/12/2019 0309   GFRAA 38 (L) 03/13/2018 1053   Lipase     Component Value Date/Time   LIPASE 28 12/06/2019 1944       Studies/Results: No results  found.  Anti-infectives: Anti-infectives (From admission, onward)   Start     Dose/Rate Route Frequency Ordered Stop   12/11/19 0830  cefTRIAXone (ROCEPHIN) 2 g in sodium chloride 0.9 % 100 mL IVPB        2 g 200 mL/hr over 30 Minutes Intravenous Every 24 hours 12/11/19 0738     12/11/19 0830  metroNIDAZOLE (FLAGYL) IVPB 500 mg        500 mg 100 mL/hr over 60 Minutes Intravenous Every 8 hours 12/11/19 0738     12/09/19 1000  anidulafungin (ERAXIS) 100 mg in sodium chloride 0.9 % 100 mL IVPB  Status:  Discontinued        100 mg 78 mL/hr over 100 Minutes Intravenous Every 24 hours 12/08/19 1050 12/11/19 0846   12/08/19 1145  anidulafungin (ERAXIS) 200 mg in sodium chloride 0.9 % 200 mL IVPB        200 mg 78 mL/hr over 200 Minutes Intravenous  Once 12/08/19 1050 12/08/19 1633   12/07/19 0600  metroNIDAZOLE (FLAGYL) IVPB 500 mg  Status:  Discontinued        500 mg 100 mL/hr over 60 Minutes Intravenous Every 8 hours 12/07/19 0030 12/07/19 0122   12/07/19 0600  piperacillin-tazobactam (ZOSYN) IVPB  2.25 g  Status:  Discontinued        2.25 g 100 mL/hr over 30 Minutes Intravenous Every 8 hours 12/07/19 0122 12/11/19 0738   12/07/19 0130  fluconazole (DIFLUCAN) IVPB 200 mg  Status:  Discontinued        200 mg 100 mL/hr over 60 Minutes Intravenous Every 24 hours 12/07/19 0030 12/08/19 1050   12/06/19 2200  ceFEPIme (MAXIPIME) 2 g in sodium chloride 0.9 % 100 mL IVPB        2 g 200 mL/hr over 30 Minutes Intravenous  Once 12/06/19 2146 12/06/19 2330   12/06/19 2200  metroNIDAZOLE (FLAGYL) IVPB 500 mg        500 mg 100 mL/hr over 60 Minutes Intravenous  Once 12/06/19 2146 12/06/19 2358       Assessment/Plan Morbid obesity - BMI - 50.7 HTN combined systolic and diastolic heart failure -Echo - 12/11/2019 - EF 55-60% Pulmonary HTN OSA NASH, non-alcoholic fatty liver disease  Acute renal failure with history of CKD3 - Cr- 2.3  Septic shock - resolved Acute sigmoid diverticulitis with  perforation and 11.0 x 5.9 x 9.3 cm abscess - S/P IR drainage of abscess 10/26.  Cx w. Streptococcus anginosis (resistant to erythromycin, PCN), E.coli (pansensitive), bacteroides fragilis.  - afebrile, WBC- 20K which is stable for last 3 days.  Repeat in am - adv to soft diet -will likely  Need Cipro/flagyl at discharge given sensitivities of cultures. -cont conservative management with hopes to get her to a CR surgeon for a one stage procedure to avoid a colostomy  -wil need repeat CT scan at some point soon.  IR following              FEN: soft, IVFs ID:  Antibiotics - ceftriaxone, metronidazole VTE: SCD's, SQH Dispo: adv diet, follow CBC, needs repeat CT at some point soon   LOS: 6 days    Henreitta Cea , [facility] 12/13/2019, 8:11 AM Please see Amion for pager number during day hours 7:00am-4:30pm or 7:00am -11:30am on weekends

## 2019-12-13 NOTE — Progress Notes (Signed)
Initial Nutrition Assessment  DOCUMENTATION CODES:   Morbid obesity  INTERVENTION:   -MVI with minerals daily -Ensure Enlive po TID, each supplement provides 350 kcal and 20 grams of protein  NUTRITION DIAGNOSIS:   Inadequate oral intake related to altered GI function as evidenced by meal completion < 50%, per patient/family report.  GOAL:   Patient will meet greater than or equal to 90% of their needs  MONITOR:   PO intake, Supplement acceptance, Weight trends, Skin, I & O's  REASON FOR ASSESSMENT:   Consult Assessment of nutrition requirement/status  ASSESSMENT:   Patient is a 69 year old female, morbidly obese BMI of 57 presents to the emergency room with a 1 week history of left lower quadrant abdominal pain.  With this she has associated anorexia with poor oral intake of solids and fluids, last bowel movement was 3 days ago.  She has a prior history of systolic and diastolic heart failure with an ejection fraction of 35 to 40% although on reevaluation October 2019 her ejection fraction was up to 55 to 60%.  She also has a history of [place].  Pt admitted with sepsis due to diverticulitis with abscess.   10/26- s/p drain placement for diverticular abscess  Reviewed I/O's: +570 ml x 24 hours and +719 ml since admission  UOP: 300 ml x 24 hours  Drain output: 30 ml x 24 hours  Spoke with pt at bedside, who was pleasant and in good spirits today. She endorses a general decline in health for 1 week PTA, due to abdominal pain, early satiety, vomiting, and weakness. Prior to acute illness pt reports she had a great appetite and consumed 3 meals per day, however, since being ill she has had little desire to eat secondary to pain and fullness. She shares that she became progressively weaker PTA and noted it was difficult for her to stand without her knees giving up. Since admission, she reports making progress and is able to stand and transfer to chair with minimal assistance.    Pt reports improving appetite since admission. She is tolerating full liquid diet well- noted she consumed 75% of fruit cup, 90% of grits, and two cups of fruit juice. Pt has been advanced to soft diet for lunch and she feels a little apprehensive about this, as she still has some aversions to foods, such as chocolate pudding. Noted meal completion 25-75%.   Pt endorses a 10 pound wt loss over the past 2 weeks secondary to decreased oral intake. Reviewed wt hx; noted pt has experienced a 15% wt loss over the past month. RD questions accuracy of wt (per wt hx, UBW around 301#). Pt also with edema, which may be masking further weight loss as well as fat and muscle depletions.   Discussed importance of good meal and supplement intake to promote healing. Reviewed supplement options on formulary; pt amenable to Ensure Enlive.   Albumin has a half-life of 21 days and is strongly affected by stress response and inflammatory process, therefore, do not expect to see an improvement in this lab value during acute hospitalization. When a patient presents with low albumin, it is likely skewed due to the acute inflammatory response.  Unless it is suspected that patient had poor PO intake or malnutrition prior to admission, then RD should not be consulted solely for low albumin. Note that low albumin is no longer used to diagnose malnutrition; [facility] uses the new malnutrition guidelines published by the [place] for Parenteral and Enteral Nutrition (  A.S.P.E.N.) and the Academy of Nutrition and Dietetics (AND).    Obesity is a complex, chronic medical condition that is optimally managed by a multidisciplinary care team. Weight loss is not an ideal goal for an acute inpatient hospitalization. However, if further work-up for obesity is warranted, consider outpatient referral to outpatient bariatric service and/or [facility].   Labs reviewed: CBGS: 92-111 (inpatient  orders for glycemic control are none).   NUTRITION - FOCUSED PHYSICAL EXAM:    Most Recent Value  Orbital Region No depletion  Upper Arm Region No depletion  Thoracic and Lumbar Region No depletion  Buccal Region No depletion  Temple Region No depletion  Clavicle Bone Region No depletion  Clavicle and Acromion Bone Region No depletion  Scapular Bone Region No depletion  Dorsal Hand No depletion  Patellar Region No depletion  Anterior Thigh Region No depletion  Posterior Calf Region No depletion  Edema (RD Assessment) Mild  Hair Reviewed  Eyes Reviewed  Mouth Reviewed  Skin Reviewed  Nails Reviewed       Diet Order:   Diet Order            DIET SOFT Room service appropriate? Yes; Fluid consistency: Thin  Diet effective now                 EDUCATION NEEDS:   Education needs have been addressed  Skin:  Skin Assessment: Reviewed RN Assessment  Last BM:  12/12/19  Height:   Ht Readings from Last 1 Encounters:  12/07/19 5\' 4"  (1.626 m)    Weight:   Wt Readings from Last 1 Encounters:  12/13/19 (!) 137.6 kg    Ideal Body Weight:  54.5 kg  BMI:  Body mass index is 52.07 kg/m.  Estimated Nutritional Needs:   Kcal:  1900-2100  Protein:  120-135 grams  Fluid:  > 1.9 L    Loistine Chance, RD, LDN, [place] Registered Dietitian II Certified Diabetes Care and Education Specialist Please refer to [facility] for RD and/or RD on-call/weekend/after hours pager

## 2019-12-13 NOTE — NC FL2 (Signed)
[place] LEVEL OF CARE SCREENING TOOL     IDENTIFICATION  Patient Name: Phyllis Todd Birthdate: 12-20-1950 Sex: female Admission Date (Current Location): 12/06/2019  [facility] and [place] Number:  Herbalist and Address:  [facility], [place] [address], [place], [place] [address]      Provider Number: [id number]  Attending Physician Name and Address:  Mariel Aloe, MD  Relative Name and Phone Number:  Meleni, Delahunt Occupational hygienist) [phone] [facility] Phone)    Current Level of Care: Hospital Recommended Level of Care: [place] Prior Approval Number:    Date Approved/Denied: 12/13/19 PASRR Number: [id number]  Discharge Plan: SNF    Current Diagnoses: Patient Active Problem List   Diagnosis Date Noted  . Severe sepsis with septic shock ([place]) 12/12/2019  . AKI (acute kidney injury) ([place]) 12/12/2019  . Intra-abdominal abscess ([place]) 12/07/2019  . Morbid obesity ([place]) 05/03/2016  . Pulmonary HTN ([place]) 01/04/2014  . RVF (right ventricular failure) ([place]) 01/04/2014  . Chronic systolic CHF (congestive heart failure) ([place]) 09/01/2013  . OSA (obstructive sleep apnea) 07/21/2013  . Non-ischemic cardiomyopathy ([place]) 07/13/2013  . Hypertensive heart disease 07/10/2013  . Essential hypertension 07/07/2013    Orientation RESPIRATION BLADDER Height & Weight     Self, Situation, Time, Place  Normal External catheter Weight: (!) 303 lb 5.7 oz (137.6 kg) Height:  5\' 4"  (162.6 cm)  BEHAVIORAL SYMPTOMS/MOOD NEUROLOGICAL BOWEL NUTRITION STATUS      Continent Diet (see d/c summary)  AMBULATORY STATUS COMMUNICATION OF NEEDS Skin   Extensive Assist Verbally Other (Comment) (Left Abdomen JP drain)                       Personal Care Assistance Level of Assistance  Bathing, Feeding, Dressing Bathing Assistance: Limited assistance Feeding assistance: Independent Dressing Assistance: Limited assistance     Functional  Limitations Info  Sight, Hearing, Speech Sight Info: Impaired Hearing Info: Adequate Speech Info: Adequate    SPECIAL CARE FACTORS FREQUENCY  PT (By licensed PT), OT (By licensed OT)     PT Frequency: 5x/week OT Frequency: 5x/week            Contractures Contractures Info: Not present    Additional Factors Info  Code Status, Allergies Code Status Info: Full Code Allergies Info: Demerol, codein, lisinopril, Peanut-containing Drug Products. almonds           Current Medications (12/13/2019):  This is the current hospital active medication list Current Facility-Administered Medications  Medication Dose Route Frequency Provider Last Rate Last Admin  . 0.9 %  sodium chloride infusion  250 mL Intravenous Continuous Anders Simmonds, MD 10 mL/hr at 12/11/19 1713 250 mL at 12/11/19 1713  . acetaminophen (TYLENOL) tablet 650 mg  650 mg Oral Q6H PRN Mariel Aloe, MD   650 mg at 12/13/19 1305  . cefTRIAXone (ROCEPHIN) 2 g in sodium chloride 0.9 % 100 mL IVPB  2 g Intravenous Q24H Chesley Mires, MD 200 mL/hr at 12/13/19 1001 2 g at 12/13/19 1001  . Chlorhexidine Gluconate Cloth 2 % PADS 6 each  6 each Topical Daily Shellia Cleverly, MD   6 each at 12/13/19 [phone]  . docusate sodium (COLACE) capsule 100 mg  100 mg Oral BID PRN Gleason, Otilio Carpen, PA-C      . guaiFENesin (MUCINEX) 12 hr tablet 600 mg  600 mg Oral BID Candee Furbish, MD   600 mg at 12/13/19 0836  .  heparin injection 5,000 Units  5,000 Units Subcutaneous Q8H Chesley Mires, MD   5,000 Units at 12/13/19 1305  . HYDROcodone-acetaminophen (NORCO/VICODIN) 5-325 MG per tablet 1-2 tablet  1-2 tablet Oral Q6H PRN Mariel Aloe, MD   1 tablet at 12/12/19 2134  . HYDROmorphone (DILAUDID) injection 0.5 mg  0.5 mg Intravenous Q4H PRN Mariel Aloe, MD      . MEDLINE mouth rinse  15 mL Mouth Rinse BID Candee Furbish, MD   15 mL at 12/13/19 0837  . metroNIDAZOLE (FLAGYL) IVPB 500 mg  500 mg Intravenous Q8H Chesley Mires, MD 100 mL/hr at  12/13/19 0837 500 mg at 12/13/19 0837  . ondansetron (ZOFRAN) injection 4 mg  4 mg Intravenous Q6H PRN Gleason, Otilio Carpen, PA-C   4 mg at 12/13/19 0835  . polyethylene glycol (MIRALAX / GLYCOLAX) packet 17 g  17 g Oral Daily PRN Gleason, Otilio Carpen, PA-C      . sodium chloride flush (NS) 0.9 % injection 10-40 mL  10-40 mL Intracatheter Q12H Candee Furbish, MD   10 mL at 12/13/19 9924  . sodium chloride flush (NS) 0.9 % injection 10-40 mL  10-40 mL Intracatheter PRN Candee Furbish, MD      . sodium chloride flush (NS) 0.9 % injection 10-40 mL  10-40 mL Intracatheter Q12H Mariel Aloe, MD   10 mL at 12/13/19 0838  . sodium chloride flush (NS) 0.9 % injection 10-40 mL  10-40 mL Intracatheter PRN Mariel Aloe, MD      . sodium chloride flush (NS) 0.9 % injection 5 mL  5 mL Intracatheter Q8H Aletta Edouard, MD   5 mL at 12/13/19 2683     Discharge Medications: Please see discharge summary for a list of discharge medications.  Relevant Imaging Results:  Relevant Lab Results:   Additional Information SSN [id number] [place], [place]

## 2019-12-13 NOTE — TOC Initial Note (Signed)
Transition of Care [facility]) - Initial/Assessment Note    Patient Details  Name: Phyllis Todd MRN: [id number] Date of Birth: 1950-02-23  Transition of Care [facility]) CM/SW Contact:    Bethann Berkshire, [place] Phone Number: 12/13/2019, 1:52 PM  Clinical Narrative:                 CSW received consult for possible SNF placement at time of discharge. CSW spoke with patient regarding PT recommendation of SNF placement at time of discharge. Patient expressed understanding of PT recommendation and is agreeable to SNF placement at time of discharge. She lives with her 67+ year old father but states he is independent. Patient reports preference for [facility]. CSW discussed insurance authorization process and provided Medicare SNF ratings list. Patient expressed being hopeful for rehab and to feel better soon. No further questions reported at this time. CSW to continue to follow and assist with discharge planning needs.   FL2 complete and bed requests faxed       Expected Discharge Plan: [place] Barriers to Discharge: Continued Medical Work up   Patient Goals and CMS Choice Patient states their goals for this hospitalization and ongoing recovery are:: SNF for rehab      Expected Discharge Plan and Services Expected Discharge Plan: [place]       Living arrangements for the past 2 months: Single Family Home                                      Prior Living Arrangements/Services Living arrangements for the past 2 months: Single Family Home Lives with:: Parents Patient language and need for interpreter reviewed:: Yes Do you feel safe going back to the place where you live?: Yes      Need for Family Participation in Patient Care: No (Comment) Care giver support system in place?: No (comment)   Criminal Activity/Legal Involvement Pertinent to Current Situation/Hospitalization: No - Comment as needed  Activities of Daily Living Home Assistive  Devices/Equipment: Cane (specify quad or straight), Built-in shower seat, Eyeglasses, Grab bars in shower ADL Screening (condition at time of admission) Patient's cognitive ability adequate to safely complete daily activities?: Yes Is the patient deaf or have difficulty hearing?: No Does the patient have difficulty seeing, even when wearing glasses/contacts?: No Does the patient have difficulty concentrating, remembering, or making decisions?: No Patient able to express need for assistance with ADLs?: Yes Does the patient have difficulty dressing or bathing?: No Independently performs ADLs?: Yes (appropriate for developmental age) Does the patient have difficulty walking or climbing stairs?: Yes Weakness of Legs: Both Weakness of Arms/Hands: None  Permission Sought/Granted                  Emotional Assessment Appearance:: Appears stated age Attitude/Demeanor/Rapport: Engaged Affect (typically observed): Accepting Orientation: : Oriented to Place, Oriented to  Time, Oriented to Situation, Oriented to Self Alcohol / Substance Use: Not Applicable Psych Involvement: No (comment)  Admission diagnosis:  Dehydration [E86.0] Abscess [L02.91] Generalized abdominal pain [R10.84] Intra-abdominal abscess (HCC) [K65.1] AKI (acute kidney injury) ([place]) [N17.9] Hypotension, unspecified hypotension type [I95.9] Patient Active Problem List   Diagnosis Date Noted  . Severe sepsis with septic shock ([place]) 12/12/2019  . AKI (acute kidney injury) ([place]) 12/12/2019  . Intra-abdominal abscess ([place]) 12/07/2019  . Morbid obesity ([place]) 05/03/2016  . Pulmonary HTN ([place]) 01/04/2014  . RVF (right ventricular failure) ([place])  01/04/2014  . Chronic systolic CHF (congestive heart failure) ([place]) 09/01/2013  . OSA (obstructive sleep apnea) 07/21/2013  . Non-ischemic cardiomyopathy ([place]) 07/13/2013  . Hypertensive heart disease 07/10/2013  . Essential hypertension 07/07/2013   PCP:  Rubbie Battiest, RN  (Inactive) Pharmacy:   [facility] [address] [place] - [place] [address] [place] [address] Phone: [phone] Fax: [place], [place] - [address] [place] [address] Phone: [phone] Fax: [phone]     Social Determinants of Health (SDOH) Interventions    Readmission Risk Interventions No flowsheet data found.

## 2019-12-13 NOTE — Progress Notes (Signed)
Referring Physician(s): Dr. Georgette Dover  Supervising Physician: Sandi Mariscal  Patient Status:  [facility] - In-pt  Chief Complaint: Diverticular abscess s/p drain placement 12/07/19  Subjective: No complaints. She is sitting up in the chair. Purulent output in JP bulb.   Allergies: Codeine, Demerol [meperidine], Lisinopril, and Peanut-containing drug products  Medications: Prior to Admission medications   Medication Sig Start Date End Date Taking? Authorizing Provider  acetaminophen (TYLENOL) 500 MG tablet Take 500 mg by mouth every 6 (six) hours as needed (pain).   Yes [provider]  aspirin EC 81 MG EC tablet Take 1 tablet (81 mg total) by mouth daily. 07/13/13  Yes Charlynne Cousins, MD  carvedilol (COREG) 25 MG tablet TAKE (1) TABLET TWICE A DAY WITH FOOD---BREAKFAST AND SUPPER. Patient taking differently: Take 25 mg by mouth 2 (two) times daily with a meal.  02/24/19  Yes Larey Dresser, MD  furosemide (LASIX) 40 MG tablet Take 1 tablet (40 mg total) by mouth daily. Must have office visit please call [phone] 10/25/19  Yes Larey Dresser, MD  losartan (COZAAR) 50 MG tablet TAKE 1 TABLET BY MOUTH TWICE DAILY 11/30/19  Yes Larey Dresser, MD  spironolactone (ALDACTONE) 25 MG tablet TAKE 1 TABLET BY MOUTH ONCE DAILY. 11/30/19  Yes Larey Dresser, MD     Vital Signs: BP (!) 148/85 (BP Location: Right Arm)   Pulse 94   Temp 98 F (36.7 C) (Oral)   Resp 17   Ht 5\' 4"  (1.626 m)   Wt (!) 303 lb 5.7 oz (137.6 kg)   SpO2 95%   BMI 52.07 kg/m   Physical Exam Pulmonary:     Effort: Pulmonary effort is normal.  Abdominal:     Comments: Lower abdominal abscess drain in place to suction. Approximately 10 cc purulent fluid in JP bulb. Dressing is clean and dry. Skin insertion site is without erythema, drainage or tenderness.   Skin:    General: Skin is warm and dry.  Neurological:     Mental Status: She is alert and oriented to person, place, and time.      Imaging: No results found.  Labs:  CBC: Recent Labs    12/10/19 0515 12/11/19 0425 12/12/19 0309 12/13/19 1105  WBC 20.0* 20.8* 20.1* 19.1*  HGB 9.1* 9.8* 9.7* 9.5*  HCT 30.1* 32.8* 32.4* 31.8*  PLT 364 368 394 360    COAGS: Recent Labs    12/07/19 1158  INR 1.2    BMP: Recent Labs    12/10/19 0515 12/11/19 0425 12/12/19 0309 12/13/19 1101  NA 142 141 142 142  K 4.6 4.5 4.5 4.6  CL 110 109 111 111  CO2 25 25 23 25   GLUCOSE 76 95 84 141*  BUN 70* 54* 41* 28*  CALCIUM 10.3 10.4* 10.3 10.2  CREATININE 3.12* 2.60* 2.31* 1.84*  GFRNONAA 16* 20* 23* 30*    LIVER FUNCTION TESTS: Recent Labs    12/06/19 1944 12/08/19 0549 12/09/19 0220  BILITOT 1.2 1.5* 1.0  AST 13* 11* 10*  ALT 11 10 12   ALKPHOS 112 108 96  PROT 7.3 6.1* 6.1*  ALBUMIN 2.3* 1.8* 1.8*    Assessment and Plan:  Diverticular abscess s/p drain placement 12/07/19: 30 ml output documented in Epic. Approximately 10 cc in JP bulb. WBC still elevated. She is afebrile. Continue flushing drain and documenting output.  Other plans per primary teams. IR will continue to follow.   Electronically Signed: Soyla Dryer, AGACNP-BC [phone]  12/13/2019, 2:06 PM   I spent a total of 15 Minutes at the the patient's bedside AND on the patient's hospital floor or unit, greater than 50% of which was counseling/coordinating care for diverticular abscess.

## 2019-12-13 NOTE — Progress Notes (Signed)
PROGRESS NOTE    Phyllis Todd  NWG:[id number] DOB: February 03, 1951 DOA: 12/06/2019 PCP: Rubbie Battiest, RN (Inactive)   Brief Narrative: Phyllis Todd is a 69 y.o. female with a history of combined systolic heart failure, obesity, OSA, NASH. Patient presented secondary to worsening LLQ abdominal pain and found to have an intra abdominal abscess and sepsis. She required ICU admission for septic shock and need for vasopressor support. She has been managed with a percutaneous drain and antibiotics.   Assessment & Plan:   Principal Problem:   Severe sepsis with septic shock [facility]) Active Problems:   Essential hypertension   Morbid obesity ([place])   Intra-abdominal abscess (H[facility])   AKI (acute kidney injury) ([place])   Intra-abdominal abscess Likely secondary to perforated diverticulum. Patient underwent percutaneous drain placement by IR on 10/26. Cultures significant for multiple species: Streptococcus anginosis, Bacteroides fragilis/ovatus, E. Coli. Patient has been managed on Zosyn, Fluconazole, Anidulafungin throughout admission and has now transitioned to Ceftriaxone and Flagyl. -Continue Ceftriaxone and Flagyl -General surgery recommendations: advanced to soft diet; eventual repeat CT abdomen/pelvis to reevaluate abscess  Severe sepsis Present on admission with associated AKI from ATN. Initially managed with multiple fluid boluses and IV antibiotics. Progressed to septic shock requiring Neosynephrine > Levophed/vasopressin drip in the ICU. Secondary to above diagnoses and patient treated with a percutaneous drain and antibiotics. Physiology resolved.  AKI on CKD stage IIIa Baseline creatinine of 1.6 from almost two years ago. Secondary to ATN. Improving back towards baseline. -Daily BMP  Chronic combined systolic/diastolic heart failure Last EF of 55-60% on Transthoracic Echocardiogram from 10/26. She has some LE edema but overall does not appear to have an acute exacerbation. She is on Lasix  and spironolactone as an outpatient as an outpatient. Lasix held this admission in setting of AKI. -Continue to hold lasix for now  Leukocytosis Secondary to infection. Trending down slowly.  Essential hypertension Normotensive currently. Initially hypotensive on admission.   Anemia Unknown etiology. No recent baseline. Patient is non-menstruating. Macrocytosis noted on CBC which is chronic. Normal folate and vitamin B12. Currently stable.  Jaw pain Likely TMJ. Cannot use NSAIDs. Improved today. -Continue analgesics  Hypoalbuminemia Significant and chronic. Acutely appears to be worse likely in setting of acute infection. -Dietitian consulted  NASH Some fluid overload in setting of above.  Obesity Body mass index is 52.07 kg/m.    DVT prophylaxis: Heparin subq Code Status:   Code Status: Full Code Family Communication: None at bedside Disposition Plan: Discharge to SNF when medically stable likely in several days   Consultants:   General surgery  PCCM  Interventional radiology  Procedures:   CVC (10/26 >>  CT GUIDED DRAINAGE OF DIVERTICULAR ABSCESS (10/26)  Findings: 12 Fr drain placed in pelvic diverticular abscess with return of foul-smelling feculent fluid. Fluid sample sent for culture analysis. Drain attached to suction bulb drainage  TRANSTHORACIC ECHOCARDIOGRAM (12/07/2019) IMPRESSIONS    1. Left ventricular ejection fraction, by estimation, is 55 to 60%. The  left ventricle has normal function. The left ventricle has no regional  wall motion abnormalities. Left ventricular diastolic parameters were  normal.  2. Right ventricular systolic function is normal. The right ventricular  size is mildly enlarged. There is normal pulmonary artery systolic  pressure.  3. Left atrial size was mildly dilated.  4. The mitral valve is normal in structure. No evidence of mitral valve  regurgitation. No evidence of mitral stenosis.  5. The aortic valve is  normal in structure. Aortic valve regurgitation  is  not visualized. No aortic stenosis is present.  6. The inferior vena cava is dilated in size with >50% respiratory  variability, suggesting right atrial pressure of 8 mmHg.   Comparison(s): Prior images reviewed side by side. The left ventricular  function is unchanged.   Antimicrobials:  Eraxis  Ceftriaxone   Zosyn   Subjective: No issues today. Jaw pain is improved but slightly still present  Objective: Vitals:   12/12/19 1239 12/12/19 2107 12/13/19 0500 12/13/19 0522  BP: 135/80 (!) 142/91  (!) 148/85  Pulse: 83 (!) 102  94  Resp: 19 15  17   Temp: 97.8 F (36.6 C) 98.8 F (37.1 C)  98 F (36.7 C)  TempSrc: Oral Oral  Oral  SpO2: 95% 96%  95%  Weight:   (!) 137.6 kg   Height:        Intake/Output Summary (Last 24 hours) at 12/13/2019 1251 Last data filed at 12/13/2019 1000 Gross per 24 hour  Intake 820.31 ml  Output 330 ml  Net 490.31 ml   Filed Weights   12/07/19 0136 12/08/19 0500 12/13/19 0500  Weight: 129.2 kg 134.1 kg (!) 137.6 kg    Examination:  General exam: Appears calm and comfortable Respiratory system: Clear to auscultation. Respiratory effort normal. Cardiovascular system: S1 & S2 heard, RRR. No murmurs, rubs, gallops or clicks. Gastrointestinal system: Abdomen is nondistended, soft and nontender. No organomegaly or masses felt. Normal bowel sounds heard. Central nervous system: Alert and oriented. No focal neurological deficits. Musculoskeletal: 2+ BLE edema. No calf tenderness Skin: No cyanosis. No rashes Psychiatry: Judgement and insight appear normal. Mood & affect appropriate.     Data Reviewed: I have personally reviewed following labs and imaging studies  CBC Lab Results  Component Value Date   WBC 19.1 (H) 12/13/2019   RBC 2.98 (L) 12/13/2019   HGB 9.5 (L) 12/13/2019   HCT 31.8 (L) 12/13/2019   MCV 106.7 (H) 12/13/2019   MCH 31.9 12/13/2019   PLT 360 12/13/2019   MCHC  29.9 (L) 12/13/2019   RDW 13.4 12/13/2019   LYMPHSABS 1.7 12/09/2019   MONOABS 1.5 (H) 12/09/2019   EOSABS 0.1 12/09/2019   BASOSABS 0.1 29/52/8413     Last metabolic panel Lab Results  Component Value Date   NA 142 12/13/2019   K 4.6 12/13/2019   CL 111 12/13/2019   CO2 25 12/13/2019   BUN 28 (H) 12/13/2019   CREATININE 1.84 (H) 12/13/2019   GLUCOSE 141 (H) 12/13/2019   GFRNONAA 30 (L) 12/13/2019   GFRAA 38 (L) 03/13/2018   CALCIUM 10.2 12/13/2019   PHOS 4.5 12/07/2019   PROT 6.1 (L) 12/09/2019   ALBUMIN 1.8 (L) 12/09/2019   BILITOT 1.0 12/09/2019   ALKPHOS 96 12/09/2019   AST 10 (L) 12/09/2019   ALT 12 12/09/2019   ANIONGAP 6 12/13/2019    CBG (last 3)  Recent Labs    12/11/19 0311 12/11/19 0838 12/11/19 1144  GLUCAP 92 92 111*     GFR: Estimated Creatinine Clearance: 41.2 mL/min (A) (by C-G formula based on SCr of 1.84 mg/dL (H)).  Coagulation Profile: Recent Labs  Lab 12/07/19 1158  INR 1.2    Recent Results (from the past 240 hour(s))  Respiratory Panel by RT PCR (Flu A&B, Covid) - Nasopharyngeal Swab     Status: None   Collection Time: 12/06/19  8:10 PM   Specimen: Nasopharyngeal Swab  Result Value Ref Range Status   SARS Coronavirus 2 by RT PCR NEGATIVE  NEGATIVE Final    Comment: (NOTE) SARS-CoV-2 target nucleic acids are NOT DETECTED.  The SARS-CoV-2 RNA is generally detectable in upper respiratoy specimens during the acute phase of infection. The lowest concentration of SARS-CoV-2 viral copies this assay can detect is 131 copies/mL. A negative result does not preclude SARS-Cov-2 infection and should not be used as the sole basis for treatment or other patient management decisions. A negative result may occur with  improper specimen collection/handling, submission of specimen other than nasopharyngeal swab, presence of viral mutation(s) within the areas targeted by this assay, and inadequate number of viral copies (<131 copies/mL). A  negative result must be combined with clinical observations, patient history, and epidemiological information. The expected result is Negative.  Fact Sheet for Patients:  PinkCheek.be  Fact Sheet for Healthcare Providers:  GravelBags.it  This test is no t yet approved or cleared by the [place] FDA and  has been authorized for detection and/or diagnosis of SARS-CoV-2 by FDA under an Emergency Use Authorization (EUA). This EUA will remain  in effect (meaning this test can be used) for the duration of the COVID-19 declaration under Section 564(b)(1) of the Act, 21 U.S.C. section 360bbb-3(b)(1), unless the authorization is terminated or revoked sooner.     Influenza A by PCR NEGATIVE NEGATIVE Final   Influenza B by PCR NEGATIVE NEGATIVE Final    Comment: (NOTE) The Xpert Xpress SARS-CoV-2/FLU/RSV assay is intended as an aid in  the diagnosis of influenza from Nasopharyngeal swab specimens and  should not be used as a sole basis for treatment. Nasal washings and  aspirates are unacceptable for Xpert Xpress SARS-CoV-2/FLU/RSV  testing.  Fact Sheet for Patients: PinkCheek.be  Fact Sheet for Healthcare Providers: GravelBags.it  This test is not yet approved or cleared by the [place] FDA and  has been authorized for detection and/or diagnosis of SARS-CoV-2 by  FDA under an Emergency Use Authorization (EUA). This EUA will remain  in effect (meaning this test can be used) for the duration of the  Covid-19 declaration under Section 564(b)(1) of the Act, 21  U.S.C. section 360bbb-3(b)(1), unless the authorization is  terminated or revoked. Performed at [facility], [place] [address], [place], [place] [address]   Blood culture (routine x 2)     Status: None   Collection Time: 12/06/19 10:38 PM   Specimen: BLOOD  Result Value Ref Range Status   Specimen  Description BLOOD LEFT ANTECUBITAL  Final   Special Requests   Final    BOTTLES DRAWN AEROBIC AND ANAEROBIC Blood Culture results may not be optimal due to an inadequate volume of blood received in culture bottles   Culture   Final    NO GROWTH 5 DAYS Performed at [facility], [place] [address] [place], [place] [address]    Report Status 12/12/2019 FINAL  Final  Blood culture (routine x 2)     Status: None   Collection Time: 12/06/19 10:44 PM   Specimen: BLOOD RIGHT HAND  Result Value Ref Range Status   Specimen Description BLOOD RIGHT HAND  Final   Special Requests   Final    BOTTLES DRAWN AEROBIC AND ANAEROBIC Blood Culture results may not be optimal due to an inadequate volume of blood received in culture bottles   Culture   Final    NO GROWTH 5 DAYS Performed at [facility], [place] [address], [place], [place] [address]    Report Status 12/12/2019 FINAL  Final  Aerobic/Anaerobic  Culture (surgical/deep wound)     Status: None   Collection Time: 12/07/19  3:18 PM   Specimen: Abscess  Result Value Ref Range Status   Specimen Description ABSCESS  Final   Special Requests DIVERTICULAR  Final   Gram Stain   Final    RARE WBC PRESENT, PREDOMINANTLY PMN ABUNDANT GRAM NEGATIVE RODS ABUNDANT GRAM POSITIVE COCCI IN PAIRS IN CHAINS FEW GRAM POSITIVE RODS    Culture   Final    FEW ESCHERICHIA COLI ABUNDANT STREPTOCOCCUS ANGINOSIS ABUNDANT BACTEROIDES FRAGILIS ABUNDANT BACTEROIDES OVATUS BETA LACTAMASE POSITIVE Performed at [facility], [place] [address] [place], [place] [address]    Report Status 12/11/2019 FINAL  Final   Organism ID, Bacteria ESCHERICHIA COLI  Final   Organism ID, Bacteria STREPTOCOCCUS ANGINOSIS  Final      Susceptibility   Escherichia coli - MIC*    AMPICILLIN 4 SENSITIVE Sensitive     CEFAZOLIN <=4 SENSITIVE Sensitive     CEFEPIME <=0.12 SENSITIVE Sensitive     CEFTAZIDIME <=1 SENSITIVE Sensitive     CEFTRIAXONE <=0.25 SENSITIVE Sensitive      CIPROFLOXACIN <=0.25 SENSITIVE Sensitive     GENTAMICIN <=1 SENSITIVE Sensitive     IMIPENEM <=0.25 SENSITIVE Sensitive     TRIMETH/SULFA <=20 SENSITIVE Sensitive     AMPICILLIN/SULBACTAM <=2 SENSITIVE Sensitive     PIP/TAZO <=4 SENSITIVE Sensitive     * FEW ESCHERICHIA COLI   Streptococcus anginosis - MIC*    PENICILLIN 0.25 INTERMEDIATE Intermediate     CEFTRIAXONE 0.5 SENSITIVE Sensitive     ERYTHROMYCIN >=8 RESISTANT Resistant     LEVOFLOXACIN <=0.25 SENSITIVE Sensitive     VANCOMYCIN 0.25 SENSITIVE Sensitive     * ABUNDANT STREPTOCOCCUS ANGINOSIS  MRSA PCR Screening     Status: None   Collection Time: 12/09/19  6:26 PM   Specimen: Nasopharyngeal  Result Value Ref Range Status   MRSA by PCR NEGATIVE NEGATIVE Final    Comment:        The GeneXpert MRSA Assay (FDA approved for NASAL specimens only), is one component of a comprehensive MRSA colonization surveillance program. It is not intended to diagnose MRSA infection nor to guide or monitor treatment for MRSA infections. Performed at [facility], [place] [address], [place], [place] [address]         Radiology Studies: No results found.      Scheduled Meds: . Chlorhexidine Gluconate Cloth  6 each Topical Daily  . guaiFENesin  600 mg Oral BID  . heparin  5,000 Units Subcutaneous Q8H  . mouth rinse  15 mL Mouth Rinse BID  . sodium chloride flush  10-40 mL Intracatheter Q12H  . sodium chloride flush  10-40 mL Intracatheter Q12H  . sodium chloride flush  5 mL Intracatheter Q8H   Continuous Infusions: . sodium chloride 250 mL (12/11/19 1713)  . cefTRIAXone (ROCEPHIN)  IV 2 g (12/13/19 1001)  . metronidazole 500 mg (12/13/19 0837)     LOS: 6 days     Cordelia Poche, MD [facility] 12/13/2019, 12:51 PM  If 7PM-7AM, please contact night-coverage [email or web address]

## 2019-12-14 ENCOUNTER — Inpatient Hospital Stay (HOSPITAL_COMMUNITY): Payer: Medicare HMO

## 2019-12-14 LAB — BASIC METABOLIC PANEL
Anion gap: 5 (ref 5–15)
BUN: 25 mg/dL — ABNORMAL HIGH (ref 8–23)
CO2: 26 mmol/L (ref 22–32)
Calcium: 10 mg/dL (ref 8.9–10.3)
Chloride: 110 mmol/L (ref 98–111)
Creatinine, Ser: 1.96 mg/dL — ABNORMAL HIGH (ref 0.44–1.00)
GFR, Estimated: 28 mL/min — ABNORMAL LOW (ref >60)
Glucose, Bld: 113 mg/dL — ABNORMAL HIGH (ref 70–99)
Potassium: 4.5 mmol/L (ref 3.5–5.1)
Sodium: 141 mmol/L (ref 135–145)

## 2019-12-14 LAB — CBC
HCT: 31.3 % — ABNORMAL LOW (ref 36.0–46.0)
Hemoglobin: 9 g/dL — ABNORMAL LOW (ref 12.0–15.0)
MCH: 31.7 pg (ref 26.0–34.0)
MCHC: 28.8 g/dL — ABNORMAL LOW (ref 30.0–36.0)
MCV: 110.2 fL — ABNORMAL HIGH (ref 80.0–100.0)
Platelets: 359 10*3/uL (ref 150–400)
RBC: 2.84 MIL/uL — ABNORMAL LOW (ref 3.87–5.11)
RDW: 13.5 % (ref 11.5–15.5)
WBC: 17.5 10*3/uL — ABNORMAL HIGH (ref 4.0–10.5)
nRBC: 0 % (ref 0.0–0.2)

## 2019-12-14 MED ORDER — IOHEXOL 9 MG/ML PO SOLN
500.0000 mL | ORAL | Status: AC
  Administered 2019-12-14 (×2): 500 mL via ORAL

## 2019-12-14 NOTE — Progress Notes (Signed)
Physical Therapy Treatment Patient Details Name: Phyllis Todd MRN: [id number] DOB: October 23, 1950 Today's Date: 12/14/2019    History of Present Illness Patient is a 69 y/o female who presents with abdominal pain, diarrhea, fatigue, weakness. Found to have Acute sigmoid diverticulitis with perforation and 11.0 x 5.9 x 9.3 cm abscess leading to sepsis. Also with E.coli (pansensitive), bacteroides fragilis. s/p abscess drainge 10/26. PMH includes NICM, CHF, HTN, obesity,    PT Comments    Pt very emotional today she reports because of having bowel frequency and being fatigued as well as raw. Pt not wanting to get up but needing to use bathroom again. Pt educated on use of stedy and how this could help her get through the next few hours more easily. Pt able to stand from bed to stedy with min-guard A, min A needed from lower BSC. Worked on maintaining static standing to build stamina in standing position. Pt with less anxiety with extra support. Mod A needed to return to bed. PT will continue to follow.    Follow Up Recommendations  Supervision for mobility/OOB;SNF     Equipment Recommendations  Other (comment) (TBA)    Recommendations for Other Services       Precautions / Restrictions Precautions Precautions: Fall Restrictions Weight Bearing Restrictions: No    Mobility  Bed Mobility Overal bed mobility: Needs Assistance Bed Mobility: Sit to Supine;Supine to Sit     Supine to sit: Min assist Sit to supine: Mod assist   General bed mobility comments: min HHA for last 10% elevated of trunk to sitting. Mod A for BLE's for return  to supine  Transfers Overall transfer level: Needs assistance Equipment used: Ambulation equipment used Transfers: Sit to/from [facility] Sit to Stand: Min assist;Min guard Stand pivot transfers: Total assist       General transfer comment: stedy used for transfer to [facility] due to pt's fatigue and to encourage her that she can get up to  bathroom. Min-guard A from elevated surface to stedy. Min A from [facility] which was lower.   Ambulation/Gait             General Gait Details: unable today due to [facility] frequency   Stairs             Wheelchair Mobility    Modified Rankin (Stroke Patients Only)       Balance Overall balance assessment: Needs assistance;History of Falls Sitting-balance support: Feet supported;Single extremity supported Sitting balance-Leahy Scale: Good     Standing balance support: During functional activity Standing balance-Leahy Scale: Poor Standing balance comment: heavily reliant on UE support. Maintained standing 2 mins, 2x.                             Cognition Arousal/Alertness: Awake/alert Behavior During Therapy: Anxious Overall Cognitive Status: Within Functional Limits for tasks assessed                                        Exercises      General Comments General comments (skin integrity, edema, etc.): pt less fearful with use of stedy. Understands that she will still need to practice RW transfers and gait progression but this should help her get through today with bowel frequency      Pertinent Vitals/Pain Pain Assessment: Faces Faces Pain Scale: Hurts little more Pain Location: anal area  raw Pain Descriptors / Indicators: Aching;Sore Pain Intervention(s): Limited activity within patient's tolerance;Monitored during session    Home Living                      Prior Function            PT Goals (current goals can now be found in the care plan section) Acute Rehab PT Goals Patient Stated Goal: to get better PT Goal Formulation: With patient Time For Goal Achievement: 12/26/19 Potential to Achieve Goals: Good Progress towards PT goals: Progressing toward goals    Frequency    Min 3X/week      PT Plan Current plan remains appropriate    Co-evaluation              AM-PAC PT "6 Clicks" Mobility   Outcome  Measure  Help needed turning from your back to your side while in a flat bed without using bedrails?: A Little Help needed moving from lying on your back to sitting on the side of a flat bed without using bedrails?: A Little Help needed moving to and from a bed to a chair (including a wheelchair)?: A Little Help needed standing up from a chair using your arms (e.g., wheelchair or bedside chair)?: A Lot Help needed to walk in hospital room?: A Lot Help needed climbing 3-5 steps with a railing? : Total 6 Click Score: 14    End of Session   Activity Tolerance: Patient limited by fatigue Patient left: with call bell/phone within reach;in bed Nurse Communication: Mobility status PT Visit Diagnosis: Muscle weakness (generalized) (M62.81);Unsteadiness on feet (R26.81);Difficulty in walking, not elsewhere classified (R26.2);Pain Pain - part of body:  (buttocks)     Time: 1600-1630 PT Time Calculation (min) (ACUTE ONLY): 30 min  Charges:  $Therapeutic Activity: 23-37 mins                     Leighton Roach, [place]  Pager [phone] Office [place] 12/14/2019, 4:44 PM

## 2019-12-14 NOTE — Progress Notes (Signed)
Subjective: No complaints.  No pain.  Ate soft diet yesterday.  Minimal nausea at times, but no emesis.    ROS: See above, otherwise other systems negative  Objective: Vital signs in last 24 hours: Temp:  [98.2 F (36.8 C)-98.4 F (36.9 C)] 98.2 F (36.8 C) (11/02 0538) Pulse Rate:  [102-109] 102 (11/02 0539) Resp:  [18] 18 (11/02 0538) BP: (131-143)/(77-83) 142/83 (11/02 0538) SpO2:  [88 %-95 %] 94 % (11/02 0539) Last BM Date: 12/13/19  Intake/Output from previous day: 11/01 0701 - 11/02 0700 In: 245 [P.O.:240] Out: 410 [Urine:400; Drains:10] Intake/Output this shift: No intake/output data recorded.  PE: Heart: regular Lungs: CTAB Abd: soft, obese, NT, drain with no output currently, +BS  Lab Results:  Recent Labs    12/13/19 1105 12/14/19 0359  WBC 19.1* 17.5*  HGB 9.5* 9.0*  HCT 31.8* 31.3*  PLT 360 359   BMET Recent Labs    12/13/19 1101 12/14/19 0359  NA 142 141  K 4.6 4.5  CL 111 110  CO2 25 26  GLUCOSE 141* 113*  BUN 28* 25*  CREATININE 1.84* 1.96*  CALCIUM 10.2 10.0   PT/INR No results for input(s): LABPROT, INR in the last 72 hours. CMP     Component Value Date/Time   NA 141 12/14/2019 0359   NA 140 01/14/2014 0921   K 4.5 12/14/2019 0359   CL 110 12/14/2019 0359   CO2 26 12/14/2019 0359   GLUCOSE 113 (H) 12/14/2019 0359   BUN 25 (H) 12/14/2019 0359   BUN 29 (H) 01/14/2014 0921   CREATININE 1.96 (H) 12/14/2019 0359   CALCIUM 10.0 12/14/2019 0359   PROT 6.1 (L) 12/09/2019 0220   ALBUMIN 1.8 (L) 12/09/2019 0220   AST 10 (L) 12/09/2019 0220   ALT 12 12/09/2019 0220   ALKPHOS 96 12/09/2019 0220   BILITOT 1.0 12/09/2019 0220   GFRNONAA 28 (L) 12/14/2019 0359   GFRAA 38 (L) 03/13/2018 1053   Lipase     Component Value Date/Time   LIPASE 28 12/06/2019 1944       Studies/Results: No results found.  Anti-infectives: Anti-infectives (From admission, onward)   Start     Dose/Rate Route Frequency Ordered Stop    12/11/19 0830  cefTRIAXone (ROCEPHIN) 2 g in sodium chloride 0.9 % 100 mL IVPB        2 g 200 mL/hr over 30 Minutes Intravenous Every 24 hours 12/11/19 0738     12/11/19 0830  metroNIDAZOLE (FLAGYL) IVPB 500 mg        500 mg 100 mL/hr over 60 Minutes Intravenous Every 8 hours 12/11/19 0738     12/09/19 1000  anidulafungin (ERAXIS) 100 mg in sodium chloride 0.9 % 100 mL IVPB  Status:  Discontinued        100 mg 78 mL/hr over 100 Minutes Intravenous Every 24 hours 12/08/19 1050 12/11/19 0846   12/08/19 1145  anidulafungin (ERAXIS) 200 mg in sodium chloride 0.9 % 200 mL IVPB        200 mg 78 mL/hr over 200 Minutes Intravenous  Once 12/08/19 1050 12/08/19 1633   12/07/19 0600  metroNIDAZOLE (FLAGYL) IVPB 500 mg  Status:  Discontinued        500 mg 100 mL/hr over 60 Minutes Intravenous Every 8 hours 12/07/19 0030 12/07/19 0122   12/07/19 0600  piperacillin-tazobactam (ZOSYN) IVPB 2.25 g  Status:  Discontinued        2.25 g 100 mL/hr over 30  Minutes Intravenous Every 8 hours 12/07/19 0122 12/11/19 0738   12/07/19 0130  fluconazole (DIFLUCAN) IVPB 200 mg  Status:  Discontinued        200 mg 100 mL/hr over 60 Minutes Intravenous Every 24 hours 12/07/19 0030 12/08/19 1050   12/06/19 2200  ceFEPIme (MAXIPIME) 2 g in sodium chloride 0.9 % 100 mL IVPB        2 g 200 mL/hr over 30 Minutes Intravenous  Once 12/06/19 2146 12/06/19 2330   12/06/19 2200  metroNIDAZOLE (FLAGYL) IVPB 500 mg        500 mg 100 mL/hr over 60 Minutes Intravenous  Once 12/06/19 2146 12/06/19 2358       Assessment/Plan Morbid obesity- BMI - 50.7 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 Cx w. Streptococcus anginosis (resistant to  erythromycin, PCN), E.coli (pansensitive), bacteroides fragilis.  - afebrile, WBC- down to 17.5 - adv to soft diet -will likely need Cipro/flagyl at discharge given sensitivities of cultures x 14 days total -cont conservative management with hopes to get her to a CR surgeon for a one stage procedure to avoid a colostomy  -repeat CT scan today since she is hopefully going to DC home in the next day or so to follow up on abscess/collection given drain output minimal  FEN: soft, IVFs ZO:[id number] - ceftriaxone, metronidazole VTE: SCD's, SQH Dispo:ct scan today   LOS: 7 days    Henreitta Cea , [facility] 12/14/2019, 7:59 AM Please see Amion for pager number during day hours 7:00am-4:30pm or 7:00am -11:30am on weekends

## 2019-12-14 NOTE — Progress Notes (Signed)
PROGRESS NOTE    Phyllis Todd  PZW:[id number] DOB: 01-31-1951 DOA: 12/06/2019 PCP: Rubbie Battiest, RN (Inactive)   Brief Narrative: Phyllis Todd is a 69 y.o. female with a history of combined systolic heart failure, obesity, OSA, NASH. Patient presented secondary to worsening LLQ abdominal pain and found to have an intra abdominal abscess and sepsis. She required ICU admission for septic shock and need for vasopressor support. She has been managed with a percutaneous drain and antibiotics.   Assessment & Plan:   Principal Problem:   Severe sepsis with septic shock [facility]) Active Problems:   Essential hypertension   Morbid obesity ([place])   Intra-abdominal abscess (HCC)   AKI (acute kidney injury) ([place])   Intra-abdominal abscess Likely secondary to perforated diverticulum. Patient underwent percutaneous drain placement by IR on 10/26. Cultures significant for multiple species: Streptococcus anginosis, Bacteroides fragilis/ovatus, E. Coli. Patient has been managed on Zosyn, Fluconazole, Anidulafungin throughout admission and has now transitioned to Ceftriaxone and Flagyl. -Continue Ceftriaxone and Flagyl; transition to Cefdinir (renally adjusted) and Flagyl on discharge with plan for 14 day total course from 10/30 -General surgery recommendations: advanced to soft diet; repeat CT abdomen/pelvis today to reevaluate abscess  Severe sepsis Present on admission with associated AKI from ATN. Initially managed with multiple fluid boluses and IV antibiotics. Progressed to septic shock requiring Neosynephrine > Levophed/vasopressin drip in the ICU. Secondary to above diagnoses and patient treated with a percutaneous drain and antibiotics. Physiology resolved.  AKI on CKD stage IIIa Baseline creatinine of 1.6 from almost two years ago. Secondary to ATN. Improving towards baseline but has leveled out today. Urine output not accurately documented -Daily BMP  Chronic combined systolic/diastolic heart  failure Last EF of 55-60% on Transthoracic Echocardiogram from 10/26. She has some LE edema but overall does not appear to have an acute exacerbation. She is on Lasix and spironolactone as an outpatient as an outpatient. Lasix held this admission in setting of AKI. -Continue to hold lasix for now  Leukocytosis Secondary to infection. Trending down slowly.  Essential hypertension Normotensive currently. Initially hypotensive on admission.   Anemia Unknown etiology. No recent baseline. Patient is non-menstruating. Macrocytosis noted on CBC which is chronic. Normal folate and vitamin B12. Currently stable.  Jaw pain Likely TMJ. Cannot use NSAIDs. Improved today. -Continue analgesics  Hypoalbuminemia Significant and chronic. Acutely appears to be worse likely in setting of acute infection. -Dietitian consulted  NASH Some fluid overload in setting of above.  Obesity Body mass index is 52.07 kg/m.    DVT prophylaxis: Heparin subq Code Status:   Code Status: Full Code Family Communication: None at bedside Disposition Plan: Discharge to SNF likely in 1 day pending repeat CT and general surgery recommendations in addition to transition to oral antibiotics. OT consulted 11/2.   Consultants:   General surgery  PCCM  Interventional radiology  Procedures:   CVC (10/26 >>  CT GUIDED DRAINAGE OF DIVERTICULAR ABSCESS (10/26)  Findings: 12 Fr drain placed in pelvic diverticular abscess with return of foul-smelling feculent fluid. Fluid sample sent for culture analysis. Drain attached to suction bulb drainage  TRANSTHORACIC ECHOCARDIOGRAM (12/07/2019) IMPRESSIONS    1. Left ventricular ejection fraction, by estimation, is 55 to 60%. The  left ventricle has normal function. The left ventricle has no regional  wall motion abnormalities. Left ventricular diastolic parameters were  normal.  2. Right ventricular systolic function is normal. The right ventricular  size is  mildly enlarged. There is normal pulmonary artery systolic  pressure.  3. Left atrial size was mildly dilated.  4. The mitral valve is normal in structure. No evidence of mitral valve  regurgitation. No evidence of mitral stenosis.  5. The aortic valve is normal in structure. Aortic valve regurgitation is  not visualized. No aortic stenosis is present.  6. The inferior vena cava is dilated in size with >50% respiratory  variability, suggesting right atrial pressure of 8 mmHg.   Comparison(s): Prior images reviewed side by side. The left ventricular  function is unchanged.   Antimicrobials:  Eraxis  Ceftriaxone   Zosyn   Subjective: Some left neck pain. Otherwise no issues.  Objective: Vitals:   12/13/19 1551 12/13/19 2035 12/14/19 0538 12/14/19 0539  BP: 131/77 (!) 143/78 (!) 142/83   Pulse: (!) 109 (!) 104 (!) 103 (!) 102  Resp: 18 18 18    Temp: 98.2 F (36.8 C) 98.4 F (36.9 C) 98.2 F (36.8 C)   TempSrc: Oral     SpO2: 95% 90% (!) 88% 94%  Weight:      Height:        Intake/Output Summary (Last 24 hours) at 12/14/2019 1029 Last data filed at 12/14/2019 0500 Gross per 24 hour  Intake 5 ml  Output 410 ml  Net -405 ml   Filed Weights   12/07/19 0136 12/08/19 0500 12/13/19 0500  Weight: 129.2 kg 134.1 kg (!) 137.6 kg    Examination:  General exam: Appears calm and comfortable Respiratory system: Clear to auscultation. Respiratory effort normal. Cardiovascular system: S1 & S2 heard, RRR. No murmurs, rubs, gallops or clicks. Gastrointestinal system: Abdomen is nondistended, soft and nontender. No organomegaly or masses felt. Normal bowel sounds heard. Drain with purulent drainage Central nervous system: Alert and oriented. No focal neurological deficits. Musculoskeletal: BLE edema. No calf tenderness Skin: No cyanosis. No rashes Psychiatry: Judgement and insight appear normal. Mood & affect appropriate.     Data Reviewed: I have personally reviewed  following labs and imaging studies  CBC Lab Results  Component Value Date   WBC 17.5 (H) 12/14/2019   RBC 2.84 (L) 12/14/2019   HGB 9.0 (L) 12/14/2019   HCT 31.3 (L) 12/14/2019   MCV 110.2 (H) 12/14/2019   MCH 31.7 12/14/2019   PLT 359 12/14/2019   MCHC 28.8 (L) 12/14/2019   RDW 13.5 12/14/2019   LYMPHSABS 1.7 12/09/2019   MONOABS 1.5 (H) 12/09/2019   EOSABS 0.1 12/09/2019   BASOSABS 0.1 70/02/7492     Last metabolic panel Lab Results  Component Value Date   NA 141 12/14/2019   K 4.5 12/14/2019   CL 110 12/14/2019   CO2 26 12/14/2019   BUN 25 (H) 12/14/2019   CREATININE 1.96 (H) 12/14/2019   GLUCOSE 113 (H) 12/14/2019   GFRNONAA 28 (L) 12/14/2019   GFRAA 38 (L) 03/13/2018   CALCIUM 10.0 12/14/2019   PHOS 4.5 12/07/2019   PROT 6.1 (L) 12/09/2019   ALBUMIN 1.8 (L) 12/09/2019   BILITOT 1.0 12/09/2019   ALKPHOS 96 12/09/2019   AST 10 (L) 12/09/2019   ALT 12 12/09/2019   ANIONGAP 5 12/14/2019    CBG (last 3)  Recent Labs    12/11/19 1144  GLUCAP 111*     GFR: Estimated Creatinine Clearance: 38.6 mL/min (A) (by C-G formula based on SCr of 1.96 mg/dL (H)).  Coagulation Profile: Recent Labs  Lab 12/07/19 1158  INR 1.2    Recent Results (from the past 240 hour(s))  Respiratory Panel by RT PCR (Flu A&B, Covid) - Nasopharyngeal  Swab     Status: None   Collection Time: 12/06/19  8:10 PM   Specimen: Nasopharyngeal Swab  Result Value Ref Range Status   SARS Coronavirus 2 by RT PCR NEGATIVE NEGATIVE Final    Comment: (NOTE) SARS-CoV-2 target nucleic acids are NOT DETECTED.  The SARS-CoV-2 RNA is generally detectable in upper respiratoy specimens during the acute phase of infection. The lowest concentration of SARS-CoV-2 viral copies this assay can detect is 131 copies/mL. A negative result does not preclude SARS-Cov-2 infection and should not be used as the sole basis for treatment or other patient management decisions. A negative result may occur with    improper specimen collection/handling, submission of specimen other than nasopharyngeal swab, presence of viral mutation(s) within the areas targeted by this assay, and inadequate number of viral copies (<131 copies/mL). A negative result must be combined with clinical observations, patient history, and epidemiological information. The expected result is Negative.  Fact Sheet for Patients:  PinkCheek.be  Fact Sheet for Healthcare Providers:  GravelBags.it  This test is no t yet approved or cleared by the [place] FDA and  has been authorized for detection and/or diagnosis of SARS-CoV-2 by FDA under an Emergency Use Authorization (EUA). This EUA will remain  in effect (meaning this test can be used) for the duration of the COVID-19 declaration under Section 564(b)(1) of the Act, 21 U.S.C. section 360bbb-3(b)(1), unless the authorization is terminated or revoked sooner.     Influenza A by PCR NEGATIVE NEGATIVE Final   Influenza B by PCR NEGATIVE NEGATIVE Final    Comment: (NOTE) The Xpert Xpress SARS-CoV-2/FLU/RSV assay is intended as an aid in  the diagnosis of influenza from Nasopharyngeal swab specimens and  should not be used as a sole basis for treatment. Nasal washings and  aspirates are unacceptable for Xpert Xpress SARS-CoV-2/FLU/RSV  testing.  Fact Sheet for Patients: PinkCheek.be  Fact Sheet for Healthcare Providers: GravelBags.it  This test is not yet approved or cleared by the [place] FDA and  has been authorized for detection and/or diagnosis of SARS-CoV-2 by  FDA under an Emergency Use Authorization (EUA). This EUA will remain  in effect (meaning this test can be used) for the duration of the  Covid-19 declaration under Section 564(b)(1) of the Act, 21  U.S.C. section 360bbb-3(b)(1), unless the authorization is  terminated or  revoked. Performed at [facility], [place] [address] [place], [place] [address]   Blood culture (routine x 2)     Status: None   Collection Time: 12/06/19 10:38 PM   Specimen: BLOOD  Result Value Ref Range Status   Specimen Description BLOOD LEFT ANTECUBITAL  Final   Special Requests   Final    BOTTLES DRAWN AEROBIC AND ANAEROBIC Blood Culture results may not be optimal due to an inadequate volume of blood received in culture bottles   Culture   Final    NO GROWTH 5 DAYS Performed at [facility], [place] [address] [place], [place] [address]    Report Status 12/12/2019 FINAL  Final  Blood culture (routine x 2)     Status: None   Collection Time: 12/06/19 10:44 PM   Specimen: BLOOD RIGHT HAND  Result Value Ref Range Status   Specimen Description BLOOD RIGHT HAND  Final   Special Requests   Final    BOTTLES DRAWN AEROBIC AND ANAEROBIC Blood Culture results may not be optimal due to an inadequate volume of blood received in culture bottles  Culture   Final    NO GROWTH 5 DAYS Performed at [facility], [place] [address] [place], [place] [address]    Report Status 12/12/2019 FINAL  Final  Aerobic/Anaerobic Culture (surgical/deep wound)     Status: None   Collection Time: 12/07/19  3:18 PM   Specimen: Abscess  Result Value Ref Range Status   Specimen Description ABSCESS  Final   Special Requests DIVERTICULAR  Final   Gram Stain   Final    RARE WBC PRESENT, PREDOMINANTLY PMN ABUNDANT GRAM NEGATIVE RODS ABUNDANT GRAM POSITIVE COCCI IN PAIRS IN CHAINS FEW GRAM POSITIVE RODS    Culture   Final    FEW ESCHERICHIA COLI ABUNDANT STREPTOCOCCUS ANGINOSIS ABUNDANT BACTEROIDES FRAGILIS ABUNDANT BACTEROIDES OVATUS BETA LACTAMASE POSITIVE Performed at [facility], [place] [address], [place], [place] [address]    Report Status 12/11/2019 FINAL  Final   Organism ID, Bacteria ESCHERICHIA COLI  Final   Organism ID, Bacteria STREPTOCOCCUS ANGINOSIS  Final       Susceptibility   Escherichia coli - MIC*    AMPICILLIN 4 SENSITIVE Sensitive     CEFAZOLIN <=4 SENSITIVE Sensitive     CEFEPIME <=0.12 SENSITIVE Sensitive     CEFTAZIDIME <=1 SENSITIVE Sensitive     CEFTRIAXONE <=0.25 SENSITIVE Sensitive     CIPROFLOXACIN <=0.25 SENSITIVE Sensitive     GENTAMICIN <=1 SENSITIVE Sensitive     IMIPENEM <=0.25 SENSITIVE Sensitive     TRIMETH/SULFA <=20 SENSITIVE Sensitive     AMPICILLIN/SULBACTAM <=2 SENSITIVE Sensitive     PIP/TAZO <=4 SENSITIVE Sensitive     * FEW ESCHERICHIA COLI   Streptococcus anginosis - MIC*    PENICILLIN 0.25 INTERMEDIATE Intermediate     CEFTRIAXONE 0.5 SENSITIVE Sensitive     ERYTHROMYCIN >=8 RESISTANT Resistant     LEVOFLOXACIN <=0.25 SENSITIVE Sensitive     VANCOMYCIN 0.25 SENSITIVE Sensitive     * ABUNDANT STREPTOCOCCUS ANGINOSIS  MRSA PCR Screening     Status: None   Collection Time: 12/09/19  6:26 PM   Specimen: Nasopharyngeal  Result Value Ref Range Status   MRSA by PCR NEGATIVE NEGATIVE Final    Comment:        The GeneXpert MRSA Assay (FDA approved for NASAL specimens only), is one component of a comprehensive MRSA colonization surveillance program. It is not intended to diagnose MRSA infection nor to guide or monitor treatment for MRSA infections. Performed at [facility], [place] [address], [place], [place] [address]         Radiology Studies: No results found.      Scheduled Meds: . Chlorhexidine Gluconate Cloth  6 each Topical Daily  . guaiFENesin  600 mg Oral BID  . heparin  5,000 Units Subcutaneous Q8H  . iohexol  500 mL Oral Q1H  . sodium chloride flush  10-40 mL Intracatheter Q12H  . sodium chloride flush  10-40 mL Intracatheter Q12H  . sodium chloride flush  5 mL Intracatheter Q8H   Continuous Infusions: . sodium chloride 250 mL (12/11/19 1713)  . cefTRIAXone (ROCEPHIN)  IV 2 g (12/14/19 0818)  . metronidazole 500 mg (12/14/19 0947)     LOS: 7 days     Cordelia Poche,  MD [facility] 12/14/2019, 10:29 AM  If 7PM-7AM, please contact night-coverage [email or web address]

## 2019-12-14 NOTE — Progress Notes (Signed)
Patient is requesting all 4 side rails up to help her reposition herself in the bed

## 2019-12-14 NOTE — Progress Notes (Signed)
Referring Physician(s): CCS Dr Lonny Prude  Supervising Physician: Aletta Edouard  Patient Status:  [facility] - In-pt  Chief Complaint:  Diverticular abscess Drain placed in IR 10/26  Subjective:  Resting No complaints Although feels "weak"-- trying to get up some  OP minimal from drain  Allergies: Codeine, Demerol [meperidine], Lisinopril, and Peanut-containing drug products  Medications: Prior to Admission medications   Medication Sig Start Date End Date Taking? Authorizing Provider  acetaminophen (TYLENOL) 500 MG tablet Take 500 mg by mouth every 6 (six) hours as needed (pain).   Yes [provider]  aspirin EC 81 MG EC tablet Take 1 tablet (81 mg total) by mouth daily. 07/13/13  Yes Charlynne Cousins, MD  carvedilol (COREG) 25 MG tablet TAKE (1) TABLET TWICE A DAY WITH FOOD---BREAKFAST AND SUPPER. Patient taking differently: Take 25 mg by mouth 2 (two) times daily with a meal.  02/24/19  Yes Larey Dresser, MD  furosemide (LASIX) 40 MG tablet Take 1 tablet (40 mg total) by mouth daily. Must have office visit please call [phone] 10/25/19  Yes Larey Dresser, MD  losartan (COZAAR) 50 MG tablet TAKE 1 TABLET BY MOUTH TWICE DAILY 11/30/19  Yes Larey Dresser, MD  spironolactone (ALDACTONE) 25 MG tablet TAKE 1 TABLET BY MOUTH ONCE DAILY. 11/30/19  Yes Larey Dresser, MD     Vital Signs: BP (!) 142/83 (BP Location: Right Arm)   Pulse (!) 102   Temp 98.2 F (36.8 C)   Resp 18   Ht 5\' 4"  (1.626 m)   Wt (!) 303 lb 5.7 oz (137.6 kg)   SpO2 94%   BMI 52.07 kg/m   Physical Exam Abdominal:     Palpations: Abdomen is soft.     Tenderness: There is no abdominal tenderness.  Skin:    General: Skin is warm.     Comments: Site of drain is clean and dry NT no bleeding OP minimal: 10 cc yesterday Looks serous in JP Report Status 12/11/2019 FINAL  Organism ID, Bacteria ESCHERICHIA COLI  Organism ID, Bacteria STREPTOCOCCUS ANGINOSIS     Neurological:      Mental Status: She is alert.     Imaging: No results found.  Labs:  CBC: Recent Labs    12/11/19 0425 12/12/19 0309 12/13/19 1105 12/14/19 0359  WBC 20.8* 20.1* 19.1* 17.5*  HGB 9.8* 9.7* 9.5* 9.0*  HCT 32.8* 32.4* 31.8* 31.3*  PLT 368 394 360 359    COAGS: Recent Labs    12/07/19 1158  INR 1.2    BMP: Recent Labs    12/11/19 0425 12/12/19 0309 12/13/19 1101 12/14/19 0359  NA 141 142 142 141  K 4.5 4.5 4.6 4.5  CL 109 111 111 110  CO2 25 23 25 26   GLUCOSE 95 84 141* 113*  BUN 54* 41* 28* 25*  CALCIUM 10.4* 10.3 10.2 10.0  CREATININE 2.60* 2.31* 1.84* 1.96*  GFRNONAA 20* 23* 30* 28*    LIVER FUNCTION TESTS: Recent Labs    12/06/19 1944 12/08/19 0549 12/09/19 0220  BILITOT 1.2 1.5* 1.0  AST 13* 11* 10*  ALT 11 10 12   ALKPHOS 112 108 96  PROT 7.3 6.1* 6.1*  ALBUMIN 2.3* 1.8* 1.8*    Assessment and Plan:  divertic abscess  IR drain placed 10/26 OP minimal rec drain inj before removal IR can see pt as OP in clinic We will follow for dc plans   Electronically Signed: Lavonia Drafts, PA-C 12/14/2019, 7:39 AM  I spent a total of 15 Minutes at the the patient's bedside AND on the patient's hospital floor or unit, greater than 50% of which was counseling/coordinating care for abscess drain

## 2019-12-14 NOTE — TOC Progression Note (Addendum)
Transition of Care [facility]) - Progression Note    Patient Details  Name: Phyllis Todd MRN: [id number] Date of Birth: [birth year]  Transition of Care [facility]) CM/SW [place], [place] Phone Number: 12/14/2019, 11:28 AM  Clinical Narrative:     No bed offers. [facility] contacted CSW requesting OT note. MD notified and ordered OT consult.   Attempted to start Auth. Pt not managed by [facility]. SNF will need to start auth once a bed is offered and chosen.   Expected Discharge Plan: [place] Barriers to Discharge: Continued Medical Work up  Expected Discharge Plan and Services Expected Discharge Plan: [place] arrangements for the past 2 months: Single Family Home                                       Social Determinants of Health (SDOH) Interventions    Readmission Risk Interventions No flowsheet data found.

## 2019-12-15 LAB — SARS CORONAVIRUS 2 BY RT PCR (HOSPITAL ORDER, PERFORMED IN ~~LOC~~ HOSPITAL LAB): SARS Coronavirus 2: NEGATIVE

## 2019-12-15 MED ORDER — METRONIDAZOLE 500 MG PO TABS
500.0000 mg | ORAL_TABLET | Freq: Three times a day (TID) | ORAL | Status: DC
  Administered 2019-12-15 – 2019-12-17 (×6): 500 mg via ORAL
  Filled 2019-12-15 (×6): qty 1

## 2019-12-15 MED ORDER — CEFDINIR 300 MG PO CAPS
300.0000 mg | ORAL_CAPSULE | Freq: Two times a day (BID) | ORAL | Status: DC
  Administered 2019-12-15 – 2019-12-17 (×5): 300 mg via ORAL
  Filled 2019-12-15 (×5): qty 1

## 2019-12-15 NOTE — Discharge Instructions (Signed)
JP Drain [employer] this sheet to all of your post-operative appointments while you have your drains.  Please measure your drains by CC's or ML's.  Make sure you drain and measure your JP Drains 3 times per day.  At the end of each day, add up totals for the left side and add up totals for the right side.    ( 9 am )     ( 3 pm )        ( 9 pm )                Date L  R  L  R  L  R  Total L/R                                                                                                                                                                                        Low-Fiber Eating Plan for 4-6 weeks, then switch to a high fiber diet Fiber is found in fruits, vegetables, whole grains, and beans. Eating a diet low in fiber helps to reduce how often you have bowel movements and how much you produce during a bowel movement. A low-fiber eating plan may help your digestive system heal if:  You have certain conditions, such as Crohn's disease or diverticulitis.  You recently had radiation therapy on your pelvis or bowel.  You recently had intestinal surgery.  You have a new surgical opening in your abdomen (colostomy or ileostomy).  Your intestine is narrowed (stricture). Your health care provider will determine how long you need to stay on this diet. Your health care provider may recommend that you work with a diet and nutrition specialist (dietitian). What are tips for following this plan? General guidelines  Follow recommendations from your dietitian about how much fiber you should have each day.  Most people on this eating plan should try to eat less than 10 grams (g) of fiber each day. Your daily fiber goal is _________________ g.  Take vitamin and mineral supplements as told by your health care provider or dietitian. Chewable or liquid forms are best when on this eating plan. Reading food labels  Check food labels for the amount of dietary  fiber.  Choose foods that have less than 2 grams of fiber in one serving. Cooking  Use white flour and other allowed grains for baking and cooking.  Cook meat using methods that keep it tender, such as braising or poaching.  Cook eggs until the yolk is completely solid.  Cook with healthy oils, such as olive oil or canola oil. Meal planning   Eat 5-6 small meals throughout  the day instead of 3 large meals.  If you are lactose intolerant: ? Choose low-lactose dairy foods. ? Do not eat dairy foods, if told by your dietitian.  Limit fat and oils to less than 8 teaspoons a day.  Eat small portions of desserts. What foods are allowed? The items listed below may not be a complete list. Talk with your dietitian about what dietary choices are best for you. Grains All bread and crackers made with white flour. Waffles, pancakes, and [place] toast. Bagels. Pretzels. Melba toast, zwieback, and matzoh. Cooked and dried cereals that do not contain whole grains, added fiber, seeds, or dried fruit. CornmealDomenick Gong. Hot and cold cereals made with refined corn, wheat, rice, or oats. Plain pasta and noodles. White rice. Vegetables Well-cooked or canned vegetables without skin, seeds, or stems. Cooked potatoes without skins. Vegetable juice. Fruits Soft-cooked or canned fruits without skin and seeds. Peeled ripe banana. Applesauce. Fruit juice without pulp. Meats and other protein foods Ground meat. Tender cuts of meat or poultry. Eggs. Fish, seafood, and shellfish. Smooth nut butters. Tofu. Dairy All milk products and drinks. Lactose-free milks, including rice, soy, and almond milks. Yogurt without fruit, nuts, chocolate, or granola mix-ins. Sour cream. Cottage cheese. Cheese. Beverages Decaf coffee. Fruit and vegetable juices or smoothies (in small amounts, with no pulp or skins, and with fruits from allowed list). Sports drinks. Herbal tea. Fats and oils Olive oil, canola oil, sunflower oil,  flaxseed oil, and grapeseed oil. Mayonnaise. Cream cheese. Margarine. Butter. Sweets and desserts Plain cakes and cookies. Cream pies and pies made with allowed fruits. Pudding. Custard. Fruit gelatin. Sherbet. Popsicles. Ice cream without nuts. Plain hard candy. Honey. Jelly. Molasses. Syrups, including chocolate syrup. Chocolate. Marshmallows. Gumdrops. Seasoning and other foods Bouillon. Broth. Cream soups made from allowed foods. Strained soup. Casseroles made with allowed foods. Ketchup. Mild mustard. Mild salad dressings. Plain gravies. Vinegar. Spices in moderation. Salt. Sugar. What foods are not allowed? The items listed below may not be a complete list. Talk with your dietitian about what dietary choices are best for you. Grains Whole wheat and whole grain breads and crackers. Multigrain breads and crackers. Rye bread. Whole grain or multigrain cereals. Cereals with nuts, raisins, or coconut. Bran. Coarse wheat cereals. Granola. High-fiber cereals. Cornmeal or corn bread. Whole grain pasta. Wild or brown rice. Quinoa. Popcorn. Buckwheat. Wheat germ. Vegetables Potato skins. Raw or undercooked vegetables. All beans and bean sprouts. Cooked greens. Corn. Peas. Cabbage. Beets. Broccoli. Brussels sprouts. Cauliflower. Mushrooms. Onions. Peppers. Parsnips. Okra. Sauerkraut. Fruit Raw or dried fruit. Berries. Fruit juice with pulp. Prune juice. Meats and other protein foods Tough, fibrous meats with gristle. Fatty meat. Poultry with skin. Fried meat, Sales executive, or fish. Deli or lunch meats. Sausage, bacon, and hot dogs. Nuts and chunky nut butter. Dried peas, beans, and lentils. Dairy Yogurt with fruit, nuts, chocolate, or granola mix-ins. Beverages Caffeinated coffee and teas. Fats and oils Avocado. Coconut. Sweets and desserts Desserts, cookies, or candies that contain nuts or coconut. Dried fruit. Jams and preserves with seeds. Marmalade. Any dessert made with fruits or grains that are not  allowed. Seasoning and other foods Corn tortilla chips. Soups made with vegetables or grains that are not allowed. Relish. Horseradish. Angie Fava. Olives. Summary  Most people on a low-fiber eating plan should eat less than 10 grams of fiber a day. Follow recommendations from your dietitian about how much fiber you should have each day.  Always check food labels to see the dietary fiber  content of packaged foods. In general, a low-fiber food will have fewer than 2 grams of fiber per serving.  In general, try to avoid whole grains, raw fruits and vegetables, dried fruit, tough cuts of meat, nuts, and seeds.  Take a vitamin and mineral supplement as told by your health care provider or dietitian. This information is not intended to replace advice given to you by your health care provider. Make sure you discuss any questions you have with your health care provider. Document Revised: 05/22/2018 Document Reviewed: 04/02/2016 Elsevier Patient Education  [place].   High-Fiber Diet Fiber, also called dietary fiber, is a type of carbohydrate that is found in fruits, vegetables, whole grains, and beans. A high-fiber diet can have many health benefits. Your health care provider may recommend a high-fiber diet to help:  Prevent constipation. Fiber can make your bowel movements more regular.  Lower your cholesterol.  Relieve the following conditions: ? Swelling of veins in the anus (hemorrhoids). ? Swelling and irritation (inflammation) of specific areas of the digestive tract (uncomplicated diverticulosis). ? A problem of the large intestine (colon) that sometimes causes pain and diarrhea (irritable bowel syndrome, IBS).  Prevent overeating as part of a weight-loss plan.  Prevent heart disease, type 2 diabetes, and certain cancers. What is my plan? The recommended daily fiber intake in grams (g) includes:  38 g for men age 42 or younger.  30 g for men over age 78.  66 g for women  age 73 or younger.  21 g for women over age 79. You can get the recommended daily intake of dietary fiber by:  Eating a variety of fruits, vegetables, grains, and beans.  Taking a fiber supplement, if it is not possible to get enough fiber through your diet. What do I need to know about a high-fiber diet?  It is better to get fiber through food sources rather than from fiber supplements. There is not a lot of research about how effective supplements are.  Always check the fiber content on the nutrition facts label of any prepackaged food. Look for foods that contain 5 g of fiber or more per serving.  Talk with a diet and nutrition specialist (dietitian) if you have questions about specific foods that are recommended or not recommended for your medical condition, especially if those foods are not listed below.  Gradually increase how much fiber you consume. If you increase your intake of dietary fiber too quickly, you may have bloating, cramping, or gas.  Drink plenty of water. Water helps you to digest fiber. What are tips for following this plan?  Eat a wide variety of high-fiber foods.  Make sure that half of the grains that you eat each day are whole grains.  Eat breads and cereals that are made with whole-grain flour instead of refined flour or white flour.  Eat brown rice, bulgur wheat, or millet instead of white rice.  Start the day with a breakfast that is high in fiber, such as a cereal that contains 5 g of fiber or more per serving.  Use beans in place of meat in soups, salads, and pasta dishes.  Eat high-fiber snacks, such as berries, raw vegetables, nuts, and popcorn.  Choose whole fruits and vegetables instead of processed forms like juice or sauce. What foods can I eat?  Fruits Berries. Pears. Apples. Oranges. Avocado. Prunes and raisins. Dried figs. Vegetables Sweet potatoes. Spinach. Kale. Artichokes. Cabbage. Broccoli. Cauliflower. Green peas. Carrots.  Squash. Grains Whole-grain  breads. Multigrain cereal. Oats and oatmeal. Brown rice. Barley. Bulgur wheat. Jay. Quinoa. Bran muffins. Popcorn. Rye wafer crackers. Meats and other proteins Navy, kidney, and pinto beans. Soybeans. Split peas. Lentils. Nuts and seeds. Dairy Fiber-fortified yogurt. Beverages Fiber-fortified soy milk. Fiber-fortified orange juice. Other foods Fiber bars. The items listed above may not be a complete list of recommended foods and beverages. Contact a dietitian for more options. What foods are not recommended? Fruits Fruit juice. Cooked, strained fruit. Vegetables Fried potatoes. Canned vegetables. Well-cooked vegetables. Grains White bread. Pasta made with refined flour. White rice. Meats and other proteins Fatty cuts of meat. Fried chicken or fried fish. Dairy Milk. Yogurt. Cream cheese. Sour cream. Fats and oils Butters. Beverages Soft drinks. Other foods Cakes and pastries. The items listed above may not be a complete list of foods and beverages to avoid. Contact a dietitian for more information. Summary  Fiber is a type of carbohydrate. It is found in fruits, vegetables, whole grains, and beans.  There are many health benefits of eating a high-fiber diet, such as preventing constipation, lowering blood cholesterol, helping with weight loss, and reducing your risk of heart disease, diabetes, and certain cancers.  Gradually increase your intake of fiber. Increasing too fast can result in cramping, bloating, and gas. Drink plenty of water while you increase your fiber.  The best sources of fiber include whole fruits and vegetables, whole grains, nuts, seeds, and beans. This information is not intended to replace advice given to you by your health care provider. Make sure you discuss any questions you have with your health care provider. Document Revised: 12/02/2016 Document Reviewed: 12/02/2016 Elsevier Patient Education  2020 [employer].

## 2019-12-15 NOTE — Progress Notes (Signed)
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 coli and was on Zosyn fluconazole and Eraxis and transition to ceftriaxone and Flagyl and would need 14 days of antibiotics  Assessment & Plan:   Principal Problem:   Severe sepsis with septic shock ([place]) Active Problems:   Essential hypertension   Morbid obesity ([place])   Intra-abdominal abscess ([place])   AKI (acute kidney injury) ([place])   1. Severe sepsis secondary to intra-abdominal abscess a. Sepsis physiology is resolved b. Currently IV Unasyn which will be transition 11/3 to cefdinir in addition to Augmentin for total of 14 days ending on  12/19/2019 c. Will need to drain study in addition to follow-up with IR probably in about a week 2. AKI superimposed on CKD 3 AM a. BUN/creatinine probably around the same as her usual but significantly improved from admission when she had AKI b. Keep volumes even no need for IV fluid at this stage 3. Systolic diastolic heart failure combined a. Stable currently b. Resuming Coreg 25 twice daily holding losartan 50 Aldactone 25 and Lasix 40 4. Anemia a. PTA aspirin which will be held 5. Hypoalbuminemia 6. NASH a. Outpatient further characterization and check LFTs 7. BMI 52  DVT prophylaxis: Heparin Code Status: Full Family Communication: None Disposition:   Status is: Inpatient  Remains inpatient appropriate because:Persistent severe electrolyte disturbances, Ongoing diagnostic testing needed not  appropriate for outpatient work up and Unsafe d/c plan   Dispo: The patient is from: Home              Anticipated d/c is to: Home              Anticipated d/c date is: 1 day              Patient currently is not medically stable to d/c.       Consultants:   Surgery  Interventional radiology  Procedures: Placement of drain  Antimicrobials: Multiple as above   Subjective:  some mild abdominal pain no distress EOMI NCAT no focal deficit  Objective: Vitals:   12/14/19 1433 12/14/19 2114 12/15/19 0453 12/15/19 0500  BP: 134/74 (!) 146/85 130/73   Pulse: 100 (!) 108 83   Resp: 18 18 17    Temp: 98.3 F (36.8 C) 98.3 F (36.8 C) 97.9 F (36.6 C)   TempSrc: Oral     SpO2: 92% 92% 94%   Weight:    (!) 138.7 kg  Height:        Intake/Output Summary (Last 24 hours) at 12/15/2019 1217 Last data filed at 12/15/2019 1308 Gross per 24 hour  Intake 530 ml  Output 180 ml  Net 350 ml   Filed Weights   12/08/19 0500 12/13/19 0500 12/15/19 0500  Weight: 134.1 kg (!) 137.6 kg (!) 138.7 kg    Examination:  General exam: Pleasant Awake no icterus no pallor thick neck Mallampati 4 Respiratory system: Good sound no rales no rhonchi no wheeze Cardiovascular system: S1-S2 no murmur no rub RRR Gastrointestinal system: Obese nontender has a drain in abdomen large pannus. Central nervous system:  Intact moving all 4 limbs equally although limited by girth Extremities: As above Skin: No edema Psychiatry: Euthymic coherent  Data Reviewed: I have personally reviewed following labs and imaging studies Sodium 141 BUN/creatinine 28/1.8-->25/1.9 WBC 19-->17.5 Hemoglobin 9 Platelet 359   Radiology Studies: CT ABDOMEN PELVIS WO CONTRAST  Result Date: 12/14/2019 CLINICAL DATA:  Drain placement for abscess 1 week ago, continued assessment of intra-abdominal infection EXAM: CT ABDOMEN AND PELVIS WITHOUT CONTRAST TECHNIQUE: Multidetector CT imaging of the abdomen and pelvis was  performed following the standard protocol without IV contrast. COMPARISON:  12/06/2019 FINDINGS: Lower chest: Trace bilateral pleural effusions.  Mild cardiomegaly. Hepatobiliary: Stable nonspecific 0.6 cm hypodense lesion in the right hepatic lobe on image 31 of series 3. Borderline prominence of the gallbladder. No biliary dilatation is identified. Pancreas: Unremarkable Spleen: Unremarkable Adrenals/Urinary Tract: Four nonobstructive right renal calculi are present, the largest measuring 0.8 cm in long axis. Adrenal glands normal. Stomach/Bowel: Air fluid level in the rectum indicating diarrheal process. Sigmoid colon diverticulosis, cannot exclude mild diverticulitis given the surrounding inflammatory stranding although that stranding appears to be more associated with a small bowel loop in the upper pelvis. Formed pigtail catheter at the site of the prior large abscess, small residual abscess containing gas and fluid has a somewhat flattened morphology measuring about 4.5 by 2.0 by 3.6 cm, markedly reduced in size from previous. Substantial reduction in the amount of free intraperitoneal gas, with only some tiny locules in a non dependent position, improved from 12/06/2019 although not totally resolved. Inflammatory stranding still tracks down towards the fundus of the uterus and left adnexa, although the gas and fluid level along the left adnexa is no longer appreciable. Vascular/Lymphatic: Aortoiliac atherosclerotic vascular disease. Small reactive periaortic lymph nodes. Reproductive: As noted above, the inflammatory stranding tracks down to the uterine fundus and adnexal regions, although is substantially improved from previous. Soft tissue prominence in the vicinity of the cervix, possibly from a fibroid although other causes for cervical mass are not excluded. Other: Small amount of subcutaneous gas along the right anterior abdominal wall likely from injections. Musculoskeletal: Stable erosive and  probably expansile dense lesions of the inferior articular facets at L3, the left superior articular facet at L4, and the bilateral inferior articular facets at L4. In particular, dense lesions associated with the left L3-4 facet joint measures about 2.9 by 3.9 by 3.8 cm. Associated left foraminal stenosis at L3-4. IMPRESSION: 1. Substantial reduction in the amount of free intraperitoneal gas, with only some tiny locules in a non dependent position, improved from previous. The pigtail catheter remains in place, with a small residual abscess containing gas and fluid. The abscess has markedly reduced in size from previous. 2. Sigmoid colon diverticulosis, cannot exclude mild diverticulitis given the surrounding inflammatory stranding. 3. Trace bilateral pleural effusions. 4. Mild cardiomegaly. 5. Four nonobstructive right renal calculi. 6. Erosive and probably expansile high density (likely partially calcified) lesions of the articular facets at L3 and L4. This is a highly unusual appearance with possibilities including tumoral calcinosis, gout, calcified synovial cysts, pseudogout, or amyloid. There is associated left foraminal stenosis at L3-4. 7. Soft tissue prominence in the vicinity of the cervix, possibly from a fibroid although other causes for cervical mass are not excluded. 8. Air fluid level in the rectum indicating diarrheal process. 9. Aortoiliac atherosclerotic vascular disease. Aortic Atherosclerosis (ICD10-I70.0). Electronically Signed   By: Van Clines M.D.   On: 12/14/2019 12:47     Scheduled Meds: . Chlorhexidine Gluconate Cloth  6 each Topical Daily  . guaiFENesin  600 mg Oral BID  . heparin  5,000 Units Subcutaneous Q8H  . sodium chloride flush  10-40 mL Intracatheter Q12H  . sodium chloride flush  10-40 mL Intracatheter Q12H  . sodium chloride flush  5 mL Intracatheter Q8H   Continuous Infusions: . sodium chloride 250 mL (12/11/19 1713)  . cefTRIAXone (ROCEPHIN)  IV 2 g  (12/15/19 0757)  . metronidazole 500 mg (12/15/19 0921)     LOS: 8 days    Time spent: 25  Nita Sells, MD [facility] To contact the attending provider between 7A-7P or the covering provider during after hours 7P-7A, please log into the web site [email or web address] and access using universal [place] password for that web site. If you do not have the password, please call the hospital operator.  12/15/2019, 12:17 PM

## 2019-12-15 NOTE — TOC Progression Note (Signed)
Transition of Care [facility]) - Progression Note    Patient Details  Name: Phyllis Todd MRN: [id number] Date of Birth: [birth year]  Transition of Care [facility]) CM/SW [place], [place] Phone Number: 12/15/2019, 10:37 AM  Clinical Narrative:     CSW spoke with pt about [facility] offering bed. [facility] was pt's first choice but she states she spoke with her family and identified [facility] as preferred. CSW explained he would contact [facility] to inquire about offer.   CSW contacted Ebony Hail at [facility] and explained new PT note in but no OT note yet. Inquired if PT note would be satisfactory. She states admission team will review.   Expected Discharge Plan: [place] Barriers to Discharge: Continued Medical Work up  Expected Discharge Plan and Services Expected Discharge Plan: [place] arrangements for the past 2 months: Single Family Home                                       Social Determinants of Health (SDOH) Interventions    Readmission Risk Interventions No flowsheet data found.

## 2019-12-15 NOTE — Progress Notes (Signed)
Referring Physician(s): Lovick,A  Supervising Physician: Jacqulynn Cadet  Patient Status:  [name] - In-pt  Chief Complaint:  Abdominal pain/abscess  Subjective: Pt feeling a little better today; has some mild tenderness LLQ, occ nausea   Allergies: Codeine, Demerol [meperidine], Lisinopril, and Peanut-containing drug products  Medications: Prior to Admission medications   Medication Sig Start Date End Date Taking? Authorizing Provider  acetaminophen (TYLENOL) 500 MG tablet Take 500 mg by mouth every 6 (six) hours as needed (pain).   Yes [provider]  aspirin EC 81 MG EC tablet Take 1 tablet (81 mg total) by mouth daily. 07/13/13  Yes Charlynne Cousins, MD  carvedilol (COREG) 25 MG tablet TAKE (1) TABLET TWICE A DAY WITH FOOD---BREAKFAST AND SUPPER. Patient taking differently: Take 25 mg by mouth 2 (two) times daily with a meal.  02/24/19  Yes Larey Dresser, MD  furosemide (LASIX) 40 MG tablet Take 1 tablet (40 mg total) by mouth daily. Must have office visit please call [phone] 10/25/19  Yes Larey Dresser, MD  losartan (COZAAR) 50 MG tablet TAKE 1 TABLET BY MOUTH TWICE DAILY 11/30/19  Yes Larey Dresser, MD  spironolactone (ALDACTONE) 25 MG tablet TAKE 1 TABLET BY MOUTH ONCE DAILY. 11/30/19  Yes Larey Dresser, MD     Vital Signs: BP 130/73 (BP Location: Right Arm)   Pulse 83   Temp 97.9 F (36.6 C)   Resp 17   Ht 5\' 4"  (1.626 m)   Wt (!) 305 lb 12.5 oz (138.7 kg)   SpO2 94%   BMI 52.49 kg/m   Physical Exam awake/alert; LLQ drain intact, insertion site clean and dry, mildly tender, OP 30 cc, drain flushes ok  Imaging: CT ABDOMEN PELVIS WO CONTRAST  Result Date: 12/14/2019 CLINICAL DATA:  Drain placement for abscess 1 week ago, continued assessment of intra-abdominal infection EXAM: CT ABDOMEN AND PELVIS WITHOUT CONTRAST TECHNIQUE: Multidetector CT imaging of the abdomen and pelvis was performed following the standard protocol without IV  contrast. COMPARISON:  12/06/2019 FINDINGS: Lower chest: Trace bilateral pleural effusions.  Mild cardiomegaly. Hepatobiliary: Stable nonspecific 0.6 cm hypodense lesion in the right hepatic lobe on image 31 of series 3. Borderline prominence of the gallbladder. No biliary dilatation is identified. Pancreas: Unremarkable Spleen: Unremarkable Adrenals/Urinary Tract: Four nonobstructive right renal calculi are present, the largest measuring 0.8 cm in long axis. Adrenal glands normal. Stomach/Bowel: Air fluid level in the rectum indicating diarrheal process. Sigmoid colon diverticulosis, cannot exclude mild diverticulitis given the surrounding inflammatory stranding although that stranding appears to be more associated with a small bowel loop in the upper pelvis. Formed pigtail catheter at the site of the prior large abscess, small residual abscess containing gas and fluid has a somewhat flattened morphology measuring about 4.5 by 2.0 by 3.6 cm, markedly reduced in size from previous. Substantial reduction in the amount of free intraperitoneal gas, with only some tiny locules in a non dependent position, improved from 12/06/2019 although not totally resolved. Inflammatory stranding still tracks down towards the fundus of the uterus and left adnexa, although the gas and fluid level along the left adnexa is no longer appreciable. Vascular/Lymphatic: Aortoiliac atherosclerotic vascular disease. Small reactive periaortic lymph nodes. Reproductive: As noted above, the inflammatory stranding tracks down to the uterine fundus and adnexal regions, although is substantially improved from previous. Soft tissue prominence in the vicinity of the cervix, possibly from a fibroid although other causes for cervical mass are not excluded. Other: Small amount of  subcutaneous gas along the right anterior abdominal wall likely from injections. Musculoskeletal: Stable erosive and probably expansile dense lesions of the inferior articular  facets at L3, the left superior articular facet at L4, and the bilateral inferior articular facets at L4. In particular, dense lesions associated with the left L3-4 facet joint measures about 2.9 by 3.9 by 3.8 cm. Associated left foraminal stenosis at L3-4. IMPRESSION: 1. Substantial reduction in the amount of free intraperitoneal gas, with only some tiny locules in a non dependent position, improved from previous. The pigtail catheter remains in place, with a small residual abscess containing gas and fluid. The abscess has markedly reduced in size from previous. 2. Sigmoid colon diverticulosis, cannot exclude mild diverticulitis given the surrounding inflammatory stranding. 3. Trace bilateral pleural effusions. 4. Mild cardiomegaly. 5. Four nonobstructive right renal calculi. 6. Erosive and probably expansile high density (likely partially calcified) lesions of the articular facets at L3 and L4. This is a highly unusual appearance with possibilities including tumoral calcinosis, gout, calcified synovial cysts, pseudogout, or amyloid. There is associated left foraminal stenosis at L3-4. 7. Soft tissue prominence in the vicinity of the cervix, possibly from a fibroid although other causes for cervical mass are not excluded. 8. Air fluid level in the rectum indicating diarrheal process. 9. Aortoiliac atherosclerotic vascular disease. Aortic Atherosclerosis (ICD10-I70.0). Electronically Signed   By: Van Clines M.D.   On: 12/14/2019 12:47    Labs:  CBC: Recent Labs    12/11/19 0425 12/12/19 0309 12/13/19 1105 12/14/19 0359  WBC 20.8* 20.1* 19.1* 17.5*  HGB 9.8* 9.7* 9.5* 9.0*  HCT 32.8* 32.4* 31.8* 31.3*  PLT 368 394 360 359    COAGS: Recent Labs    12/07/19 1158  INR 1.2    BMP: Recent Labs    12/11/19 0425 12/12/19 0309 12/13/19 1101 12/14/19 0359  NA 141 142 142 141  K 4.5 4.5 4.6 4.5  CL 109 111 111 110  CO2 25 23 25 26   GLUCOSE 95 84 141* 113*  BUN 54* 41* 28* 25*    CALCIUM 10.4* 10.3 10.2 10.0  CREATININE 2.60* 2.31* 1.84* 1.96*  GFRNONAA 20* 23* 30* 28*    LIVER FUNCTION TESTS: Recent Labs    12/06/19 1944 12/08/19 0549 12/09/19 0220  BILITOT 1.2 1.5* 1.0  AST 13* 11* 10*  ALT 11 10 12   ALKPHOS 112 108 96  PROT 7.3 6.1* 6.1*  ALBUMIN 2.3* 1.8* 1.8*    Assessment and Plan: Pt with hx diverticular abscess, s/p perc drain placement 10/26; afebrile; WBC 17.5, hgb 9(9.5), creat 1.96; abscess cx- e coli, strept anginosis, bacteroides; CT yesterday revealed:  1. Substantial reduction in the amount of free intraperitoneal gas, with only some tiny locules in a non dependent position, improved from previous. The pigtail catheter remains in place, with a small residual abscess containing gas and fluid. The abscess has markedly reduced in size from previous. 2. Sigmoid colon diverticulosis, cannot exclude mild diverticulitis given the surrounding inflammatory stranding. 3. Trace bilateral pleural effusions. 4. Mild cardiomegaly. 5. Four nonobstructive right renal calculi. 6. Erosive and probably expansile high density (likely partially calcified) lesions of the articular facets at L3 and L4. This is a highly unusual appearance with possibilities including tumoral calcinosis, gout, calcified synovial cysts, pseudogout, or amyloid. There is associated left foraminal stenosis at L3-4. 7. Soft tissue prominence in the vicinity of the cervix, possibly from a fibroid although other causes for cervical mass are not excluded. 8. Air fluid level in  the rectum indicating diarrheal process. 9. Aortoiliac atherosclerotic vascular disease.  Aortic Atherosclerosis  Continue current tx; as OP rec once daily flush of drain with 5 cc sterile NS, OP recording, dressing change every 1-2 days; pt will be set up for IR clinic f/u closer to d/c date  Electronically Signed: D. Rowe Robert, PA-C 12/15/2019, 10:44 AM   I spent a total of 15 minutes at the the  patient's bedside AND on the patient's hospital floor or unit, greater than 50% of which was counseling/coordinating care for abdominal abscess drain    Patient ID: Phyllis Todd, female   DOB: 09-30-1950, 69 y.o.   MRN: [id number]

## 2019-12-15 NOTE — Progress Notes (Signed)
Subjective: Stable, no new complaints or changes.  Tolerating her diet.  No pain.  ROS: See above, otherwise other systems negative  Objective: Vital signs in last 24 hours: Temp:  [97.9 F (36.6 C)-98.3 F (36.8 C)] 97.9 F (36.6 C) (11/03 0453) Pulse Rate:  [83-108] 83 (11/03 0453) Resp:  [17-18] 17 (11/03 0453) BP: (130-146)/(73-85) 130/73 (11/03 0453) SpO2:  [92 %-94 %] 94 % (11/03 0453) Weight:  [138.7 kg] 138.7 kg (11/03 0500) Last BM Date: 12/14/19  Intake/Output from previous day: 11/02 0701 - 11/03 0700 In: 880 [P.O.:770; I.V.:10; IV Piggyback:100] Out: 180 [Urine:150; Drains:30] Intake/Output this shift: No intake/output data recorded.  PE: Heart: regular Lungs: CTAB Abd: soft, NT, ND, morbidly obese, JP drain with minimal serous drainage noted.  Lab Results:  Recent Labs    12/13/19 1105 12/14/19 0359  WBC 19.1* 17.5*  HGB 9.5* 9.0*  HCT 31.8* 31.3*  PLT 360 359   BMET Recent Labs    12/13/19 1101 12/14/19 0359  NA 142 141  K 4.6 4.5  CL 111 110  CO2 25 26  GLUCOSE 141* 113*  BUN 28* 25*  CREATININE 1.84* 1.96*  CALCIUM 10.2 10.0   PT/INR No results for input(s): LABPROT, INR in the last 72 hours. CMP     Component Value Date/Time   NA 141 12/14/2019 0359   NA 140 01/14/2014 0921   K 4.5 12/14/2019 0359   CL 110 12/14/2019 0359   CO2 26 12/14/2019 0359   GLUCOSE 113 (H) 12/14/2019 0359   BUN 25 (H) 12/14/2019 0359   BUN 29 (H) 01/14/2014 0921   CREATININE 1.96 (H) 12/14/2019 0359   CALCIUM 10.0 12/14/2019 0359   PROT 6.1 (L) 12/09/2019 0220   ALBUMIN 1.8 (L) 12/09/2019 0220   AST 10 (L) 12/09/2019 0220   ALT 12 12/09/2019 0220   ALKPHOS 96 12/09/2019 0220   BILITOT 1.0 12/09/2019 0220   GFRNONAA 28 (L) 12/14/2019 0359   GFRAA 38 (L) 03/13/2018 1053   Lipase     Component Value Date/Time   LIPASE 28 12/06/2019 1944       Studies/Results: CT ABDOMEN PELVIS WO CONTRAST  Result Date: 12/14/2019 CLINICAL DATA:   Drain placement for abscess 1 week ago, continued assessment of intra-abdominal infection EXAM: CT ABDOMEN AND PELVIS WITHOUT CONTRAST TECHNIQUE: Multidetector CT imaging of the abdomen and pelvis was performed following the standard protocol without IV contrast. COMPARISON:  12/06/2019 FINDINGS: Lower chest: Trace bilateral pleural effusions.  Mild cardiomegaly. Hepatobiliary: Stable nonspecific 0.6 cm hypodense lesion in the right hepatic lobe on image 31 of series 3. Borderline prominence of the gallbladder. No biliary dilatation is identified. Pancreas: Unremarkable Spleen: Unremarkable Adrenals/Urinary Tract: Four nonobstructive right renal calculi are present, the largest measuring 0.8 cm in long axis. Adrenal glands normal. Stomach/Bowel: Air fluid level in the rectum indicating diarrheal process. Sigmoid colon diverticulosis, cannot exclude mild diverticulitis given the surrounding inflammatory stranding although that stranding appears to be more associated with a small bowel loop in the upper pelvis. Formed pigtail catheter at the site of the prior large abscess, small residual abscess containing gas and fluid has a somewhat flattened morphology measuring about 4.5 by 2.0 by 3.6 cm, markedly reduced in size from previous. Substantial reduction in the amount of free intraperitoneal gas, with only some tiny locules in a non dependent position, improved from 12/06/2019 although not totally resolved. Inflammatory stranding still tracks down towards the fundus of the uterus and left  adnexa, although the gas and fluid level along the left adnexa is no longer appreciable. Vascular/Lymphatic: Aortoiliac atherosclerotic vascular disease. Small reactive periaortic lymph nodes. Reproductive: As noted above, the inflammatory stranding tracks down to the uterine fundus and adnexal regions, although is substantially improved from previous. Soft tissue prominence in the vicinity of the cervix, possibly from a fibroid  although other causes for cervical mass are not excluded. Other: Small amount of subcutaneous gas along the right anterior abdominal wall likely from injections. Musculoskeletal: Stable erosive and probably expansile dense lesions of the inferior articular facets at L3, the left superior articular facet at L4, and the bilateral inferior articular facets at L4. In particular, dense lesions associated with the left L3-4 facet joint measures about 2.9 by 3.9 by 3.8 cm. Associated left foraminal stenosis at L3-4. IMPRESSION: 1. Substantial reduction in the amount of free intraperitoneal gas, with only some tiny locules in a non dependent position, improved from previous. The pigtail catheter remains in place, with a small residual abscess containing gas and fluid. The abscess has markedly reduced in size from previous. 2. Sigmoid colon diverticulosis, cannot exclude mild diverticulitis given the surrounding inflammatory stranding. 3. Trace bilateral pleural effusions. 4. Mild cardiomegaly. 5. Four nonobstructive right renal calculi. 6. Erosive and probably expansile high density (likely partially calcified) lesions of the articular facets at L3 and L4. This is a highly unusual appearance with possibilities including tumoral calcinosis, gout, calcified synovial cysts, pseudogout, or amyloid. There is associated left foraminal stenosis at L3-4. 7. Soft tissue prominence in the vicinity of the cervix, possibly from a fibroid although other causes for cervical mass are not excluded. 8. Air fluid level in the rectum indicating diarrheal process. 9. Aortoiliac atherosclerotic vascular disease. Aortic Atherosclerosis (ICD10-I70.0). Electronically Signed   By: Van Clines M.D.   On: 12/14/2019 12:47    Anti-infectives: Anti-infectives (From admission, onward)   Start     Dose/Rate Route Frequency Ordered Stop   12/11/19 0830  cefTRIAXone (ROCEPHIN) 2 g in sodium chloride 0.9 % 100 mL IVPB        2 g 200 mL/hr  over 30 Minutes Intravenous Every 24 hours 12/11/19 0738     12/11/19 0830  metroNIDAZOLE (FLAGYL) IVPB 500 mg        500 mg 100 mL/hr over 60 Minutes Intravenous Every 8 hours 12/11/19 0738     12/09/19 1000  anidulafungin (ERAXIS) 100 mg in sodium chloride 0.9 % 100 mL IVPB  Status:  Discontinued        100 mg 78 mL/hr over 100 Minutes Intravenous Every 24 hours 12/08/19 1050 12/11/19 0846   12/08/19 1145  anidulafungin (ERAXIS) 200 mg in sodium chloride 0.9 % 200 mL IVPB        200 mg 78 mL/hr over 200 Minutes Intravenous  Once 12/08/19 1050 12/08/19 1633   12/07/19 0600  metroNIDAZOLE (FLAGYL) IVPB 500 mg  Status:  Discontinued        500 mg 100 mL/hr over 60 Minutes Intravenous Every 8 hours 12/07/19 0030 12/07/19 0122   12/07/19 0600  piperacillin-tazobactam (ZOSYN) IVPB 2.25 g  Status:  Discontinued        2.25 g 100 mL/hr over 30 Minutes Intravenous Every 8 hours 12/07/19 0122 12/11/19 0738   12/07/19 0130  fluconazole (DIFLUCAN) IVPB 200 mg  Status:  Discontinued        200 mg 100 mL/hr over 60 Minutes Intravenous Every 24 hours 12/07/19 0030 12/08/19 1050   12/06/19 2200  ceFEPIme (MAXIPIME) 2 g in sodium chloride 0.9 % 100 mL IVPB        2 g 200 mL/hr over 30 Minutes Intravenous  Once 12/06/19 2146 12/06/19 2330   12/06/19 2200  metroNIDAZOLE (FLAGYL) IVPB 500 mg        500 mg 100 mL/hr over 60 Minutes Intravenous  Once 12/06/19 2146 12/06/19 2358       Assessment/Plan Morbid obesity- BMI - 50.7 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 Cx w. Streptococcus anginosis (resistant to erythromycin, PCN), E.coli (pansensitive), bacteroides fragilis.  - afebrile, WBC- down to 17.5  yesterday -low fiber diet -can transition to Cefdinir/flagyl for a total of 14 days -follow up arranged with a colorectal specialist in our office. -repeat CT scan shows significant improvement in all aspects of air, fluid collections, and diverticulitis.  Continue conservative management.  She will need IR follow up for her drain.   -surgically stable for DC from our standpoint  ENI:DPOE UM:[id number] - ceftriaxone, metronidazole, can switch to cefdinir/flagyl at any point.  Treat for 14 days total VTE: SCD's, SQH Dispo:stable for DC from our standpoint   LOS: 8 days    Henreitta Cea , [facility] 12/15/2019, 7:51 AM Please see Amion for pager number during day hours 7:00am-4:30pm or 7:00am -11:30am on weekends

## 2019-12-15 NOTE — Evaluation (Signed)
Occupational Therapy Evaluation Patient Details Name: Phyllis Todd MRN: [id number] DOB: [birth year] Today's Date: 12/15/2019    History of Present Illness Patient is a 69 y/o female who presents with abdominal pain, diarrhea, fatigue, weakness. Found to have Acute sigmoid diverticulitis with perforation and 11.0 x 5.9 x 9.3 cm abscess leading to sepsis. Also with E.coli (pansensitive), bacteroides fragilis. s/p abscess drainge 10/26. PMH includes NICM, CHF, HTN, obesity,   Clinical Impression   Pt PTA:Pt living at home with father and reports independence. Pt currently set-upA to [place] for ADL due to weakness, pain and decreaed activity tolerance. Pt using stedy for use at EOB, otherwise, pt modA for power up from recliner and 3in1. Pt was minA to modA overall for stand pivot transfers with RW. Pt requires motivational cueing. O2 dropped to 87% with exertion, but recovered >90% on RA within 10 secs. HR stable 110s with activity. Pt would greatly benefit from continued OT skilled services for ADL, mobility and safety in SNF setting. OT following acutely.    Follow Up Recommendations  SNF    Equipment Recommendations  Other (comment) (TBD)    Recommendations for Other Services       Precautions / Restrictions Precautions Precautions: Fall;Other (comment) Precaution Comments: watch O2 after exertion Restrictions Weight Bearing Restrictions: No      Mobility Bed Mobility Overal bed mobility: Needs Assistance Bed Mobility: Supine to Sit     Supine to sit: Min assist     General bed mobility comments: Pt moving BLEs and using hand rail.    Transfers Overall transfer level: Needs assistance Equipment used: Ambulation equipment used Transfers: Sit to/from [facility] Sit to Stand: Mod assist Stand pivot transfers: Min assist       General transfer comment: modA for sit to stand from bed to standing with stedy and then STS from recliner with RW in front with  modA for power up and transfer to 3in1 <-> recliner.    Balance Overall balance assessment: Needs assistance;History of Falls Sitting-balance support: Feet supported;Single extremity supported Sitting balance-Leahy Scale: Good     Standing balance support: During functional activity Standing balance-Leahy Scale: Poor Standing balance comment: heavily reliant on UE support                           ADL either performed or assessed with clinical judgement   ADL Overall ADL's : Needs assistance/impaired Eating/Feeding: Set up;Sitting   Grooming: Set up;Sitting   Upper Body Bathing: Minimal assistance;Sitting   Lower Body Bathing: Maximal assistance;Total assistance;Cueing for safety;Cueing for sequencing;Sitting/lateral leans;Sit to/from stand Lower Body Bathing Details (indicate cue type and reason): Difficulty standing; sitting at  Upper Body Dressing : Minimal assistance;Sitting   Lower Body Dressing: Maximal assistance;Total assistance;+2 for physical assistance;Cueing for safety;Sitting/lateral leans;Sit to/from stand Lower Body Dressing Details (indicate cue type and reason): unable to bend BLEs to reach feet; in standing, pt's arms are too short to assist down low Toilet Transfer: Minimal assistance;BSC;Stand-pivot   Toileting- Clothing Manipulation and Hygiene: Maximal assistance;Sitting/lateral lean;Sit to/from stand Toileting - Clothing Manipulation Details (indicate cue type and reason): can perform pericare, but requires assist for cleaning area     Functional mobility during ADLs: Moderate assistance;Cueing for safety;Rolling walker (requires assist for power up from recliner and from bed) General ADL Comments: Pt with decreased ability to care for self due to pain, weakness and decreased activity tolerance     Vision Baseline Vision/History: Wears glasses Wears  Glasses: At all times Patient Visual Report: No change from baseline Vision Assessment?: No  apparent visual deficits Additional Comments: "sometimes blurry when I'm reading; I need to get my eyes checked again."Pt able to read menu and most print easily.     Perception     Praxis      Pertinent Vitals/Pain Pain Assessment: Faces Faces Pain Scale: Hurts little more Pain Location: pericare area Pain Descriptors / Indicators: Aching;Sore Pain Intervention(s): Monitored during session     Hand Dominance Right   Extremity/Trunk Assessment Upper Extremity Assessment Upper Extremity Assessment: Generalized weakness   Lower Extremity Assessment Lower Extremity Assessment: Generalized weakness;Defer to PT evaluation;LLE deficits/detail;RLE deficits/detail RLE Deficits / Details: increased swelling LLE Deficits / Details: increased swelling   Cervical / Trunk Assessment Cervical / Trunk Assessment: Normal   Communication Communication Communication: No difficulties   Cognition Arousal/Alertness: Awake/alert Behavior During Therapy: Anxious Overall Cognitive Status: Within Functional Limits for tasks assessed                                     General Comments  Stedy required for use at EOB, otherwise, pt modA for power up from recliner and 3in1.    Exercises     Shoulder Instructions      Home Living Family/patient expects to be discharged to:: Private residence Living Arrangements: Parent Available Help at Discharge: Family;Available 24 hours/day Type of Home: House Home Access: Level entry     Home Layout: One level     Bathroom Shower/Tub: Tub/shower unit;Walk-in shower   Bathroom Toilet: Handicapped height     Home Equipment: Shower seat - built in;Walker - 2 wheels          Prior Functioning/Environment Level of Independence: Independent        Comments: Has a cleaning lady. Drives. Does ADLs. Does not cook. Reports falls.        OT Problem List: Decreased strength;Decreased activity tolerance;Impaired balance (sitting  and/or standing);Pain;Increased edema;Decreased safety awareness;Decreased knowledge of use of DME or AE      OT Treatment/Interventions: Self-care/ADL training    OT Goals(Current goals can be found in the care plan section) Acute Rehab OT Goals Patient Stated Goal: to get better OT Goal Formulation: With patient Time For Goal Achievement: 12/29/19 Potential to Achieve Goals: Good ADL Goals Pt Will Perform Grooming: with supervision;standing Pt Will Perform Upper Body Dressing: with supervision;standing Pt Will Perform Lower Body Dressing: with min assist;sit to/from stand;with adaptive equipment Pt Will Transfer to Toilet: with supervision;ambulating;bedside commode Pt/caregiver will Perform Home Exercise Program: Increased strength;Both right and left upper extremity Additional ADL Goal #1: Pt will increase standing to x5 mins for ADL tasks.  OT Frequency: Min 2X/week   Barriers to D/C: Decreased caregiver support          Co-evaluation              AM-PAC OT "6 Clicks" Daily Activity     Outcome Measure Help from another person eating meals?: None Help from another person taking care of personal grooming?: A Little Help from another person toileting, which includes using toliet, bedpan, or urinal?: A Lot Help from another person bathing (including washing, rinsing, drying)?: A Lot Help from another person to put on and taking off regular upper body clothing?: A Little Help from another person to put on and taking off regular lower body clothing?: A Lot 6 Click Score:  16   End of Session Equipment Utilized During Treatment: Rolling walker;Gait belt Nurse Communication: Mobility status  Activity Tolerance: Patient tolerated treatment well Patient left: in chair;with call bell/phone within reach  OT Visit Diagnosis: Unsteadiness on feet (R26.81);Muscle weakness (generalized) (M62.81);Pain Pain - part of body: Ankle and joints of foot                Time:  7867-5449 OT Time Calculation (min): 50 min Charges:  OT General Charges $OT Visit: 1 Visit OT Evaluation $OT Eval Moderate Complexity: 1 Mod OT Treatments $Self Care/Home Management : 8-22 mins $Therapeutic Activity: 8-22 mins  Jefferey Pica, OTR/L Acute Rehabilitation Services Pager: [phone] Office: [phone]   Jozelyn Kuwahara C 12/15/2019, 1:48 PM

## 2019-12-15 NOTE — TOC Progression Note (Signed)
Transition of Care [facility]) - Progression Note    Patient Details  Name: Jenavi Beedle MRN: [id number] Date of Birth: [birth year]  Transition of Care [facility]) CM/SW [place], [place] Phone Number: 12/15/2019, 2:37 PM  Clinical Narrative:     Ebony Hail from [facility] notified CSW they could offer bed for pt. Pt is agreeable. [facility] will start auth. New covid test ordered by MD.    Expected Discharge Plan: [place] Barriers to Discharge: Continued Medical Work up  Expected Discharge Plan and Services Expected Discharge Plan: [place] arrangements for the past 2 months: Single Family Home                                       Social Determinants of Health (SDOH) Interventions    Readmission Risk Interventions No flowsheet data found.

## 2019-12-16 LAB — COMPREHENSIVE METABOLIC PANEL
ALT: 13 U/L (ref 0–44)
AST: 12 U/L — ABNORMAL LOW (ref 15–41)
Albumin: 1.9 g/dL — ABNORMAL LOW (ref 3.5–5.0)
Alkaline Phosphatase: 99 U/L (ref 38–126)
Anion gap: 6 (ref 5–15)
BUN: 16 mg/dL (ref 8–23)
CO2: 24 mmol/L (ref 22–32)
Calcium: 9.9 mg/dL (ref 8.9–10.3)
Chloride: 108 mmol/L (ref 98–111)
Creatinine, Ser: 1.96 mg/dL — ABNORMAL HIGH (ref 0.44–1.00)
GFR, Estimated: 28 mL/min — ABNORMAL LOW (ref >60)
Glucose, Bld: 100 mg/dL — ABNORMAL HIGH (ref 70–99)
Potassium: 4.1 mmol/L (ref 3.5–5.1)
Sodium: 138 mmol/L (ref 135–145)
Total Bilirubin: 0.5 mg/dL (ref 0.3–1.2)
Total Protein: 5.8 g/dL — ABNORMAL LOW (ref 6.5–8.1)

## 2019-12-16 LAB — CBC WITH DIFFERENTIAL/PLATELET
Abs Immature Granulocytes: 0.53 10*3/uL — ABNORMAL HIGH (ref 0.00–0.07)
Basophils Absolute: 0.1 10*3/uL (ref 0.0–0.1)
Basophils Relative: 0 %
Eosinophils Absolute: 0.2 10*3/uL (ref 0.0–0.5)
Eosinophils Relative: 1 %
HCT: 28.2 % — ABNORMAL LOW (ref 36.0–46.0)
Hemoglobin: 8.3 g/dL — ABNORMAL LOW (ref 12.0–15.0)
Immature Granulocytes: 4 %
Lymphocytes Relative: 12 %
Lymphs Abs: 1.8 10*3/uL (ref 0.7–4.0)
MCH: 32.2 pg (ref 26.0–34.0)
MCHC: 29.4 g/dL — ABNORMAL LOW (ref 30.0–36.0)
MCV: 109.3 fL — ABNORMAL HIGH (ref 80.0–100.0)
Monocytes Absolute: 1 10*3/uL (ref 0.1–1.0)
Monocytes Relative: 7 %
Neutro Abs: 11.6 10*3/uL — ABNORMAL HIGH (ref 1.7–7.7)
Neutrophils Relative %: 76 %
Platelets: 397 10*3/uL (ref 150–400)
RBC: 2.58 MIL/uL — ABNORMAL LOW (ref 3.87–5.11)
RDW: 13.4 % (ref 11.5–15.5)
WBC: 15.2 10*3/uL — ABNORMAL HIGH (ref 4.0–10.5)
nRBC: 0 % (ref 0.0–0.2)

## 2019-12-16 MED ORDER — FUROSEMIDE 20 MG PO TABS
20.0000 mg | ORAL_TABLET | Freq: Every day | ORAL | Status: DC
  Administered 2019-12-16 – 2019-12-17 (×2): 20 mg via ORAL
  Filled 2019-12-16 (×2): qty 1

## 2019-12-16 NOTE — Progress Notes (Signed)
Physical Therapy Treatment Patient Details Name: Hitomi Slape MRN: [id number] DOB: 21-Jul-1950 Today's Date: 12/16/2019    History of Present Illness Patient is a 69 y/o female who presents with abdominal pain, diarrhea, fatigue, weakness. Found to have Acute sigmoid diverticulitis with perforation and 11.0 x 5.9 x 9.3 cm abscess leading to sepsis. Also with E.coli (pansensitive), bacteroides fragilis. s/p abscess drainge 10/26. PMH includes NICM, CHF, HTN, obesity,    PT Comments    Pt progressing well with mobility, ambulating room distance and tolerating repeated transfers throughout session. Pt overall requires min-mod assist to perform mobility tasks, and requires cues for safe mobility at this time. SNF remains appropriate dispo, will continue to follow acutely.     Follow Up Recommendations  Supervision for mobility/OOB;SNF     Equipment Recommendations  Other (comment) (TBA)    Recommendations for Other Services       Precautions / Restrictions Precautions Precautions: Fall;Other (comment) Restrictions Weight Bearing Restrictions: No    Mobility  Bed Mobility Overal bed mobility: Needs Assistance Bed Mobility: Supine to Sit     Supine to sit: Min assist;HOB elevated;+2 for safety/equipment     General bed mobility comments: min assist for LE translation to EOB, cuing for hand placement on bedrails to pull to sit.  Transfers Overall transfer level: Needs assistance Equipment used: Rolling walker (2 wheeled) Transfers: Sit to/from Stand Sit to Stand: Mod assist;+2 physical assistance         General transfer comment: Mod +2 for power up, steadying, and cuing for "nose over toes"/foot placement under BOS to aide in stand. sit to stand x3, from EOB x1 and recliner x2.  Ambulation/Gait Ambulation/Gait assistance: Min assist;+2 safety/equipment Gait Distance (Feet): 5 Feet Assistive device: Rolling walker (2 wheeled) Gait Pattern/deviations: Step-through  pattern;Decreased stride length;Trunk flexed;Shuffle Gait velocity: decr   General Gait Details: min assist to steady, guide pt and RW. Verbal cuing for upright posture, navigating RW. DOE 2/4, recovers with seated rest.   Stairs             Wheelchair Mobility    Modified Rankin (Stroke Patients Only)       Balance Overall balance assessment: Needs assistance;History of Falls Sitting-balance support: Feet supported;Single extremity supported Sitting balance-Leahy Scale: Fair     Standing balance support: During functional activity Standing balance-Leahy Scale: Poor Standing balance comment: heavily reliant on UE support                            Cognition Arousal/Alertness: Awake/alert Behavior During Therapy: Anxious Overall Cognitive Status: Within Functional Limits for tasks assessed                                 General Comments: requires step-by-step cuing given pt anxiety; pt stating she is "flustered"      Exercises General Exercises - Lower Extremity Hip Flexion/Marching: AROM;Both;Standing (x3 bilaterally)    General Comments        Pertinent Vitals/Pain Pain Assessment: Faces Faces Pain Scale: Hurts little more Pain Location: back, neck, buttocks Pain Descriptors / Indicators: Aching;Sore Pain Intervention(s): Limited activity within patient's tolerance;Monitored during session;Repositioned    Home Living                      Prior Function            PT Goals (current goals can  now be found in the care plan section) Acute Rehab PT Goals Patient Stated Goal: to get better PT Goal Formulation: With patient Time For Goal Achievement: 12/26/19 Potential to Achieve Goals: Good Progress towards PT goals: Progressing toward goals    Frequency    Min 3X/week      PT Plan Current plan remains appropriate    Co-evaluation              AM-PAC PT "6 Clicks" Mobility   Outcome Measure  Help  needed turning from your back to your side while in a flat bed without using bedrails?: A Little Help needed moving from lying on your back to sitting on the side of a flat bed without using bedrails?: A Little Help needed moving to and from a bed to a chair (including a wheelchair)?: A Lot Help needed standing up from a chair using your arms (e.g., wheelchair or bedside chair)?: A Lot Help needed to walk in hospital room?: A Lot Help needed climbing 3-5 steps with a railing? : A Lot 6 Click Score: 14    End of Session   Activity Tolerance: Patient limited by fatigue Patient left: with call bell/phone within reach;in chair (pt verbalizes she will press call button and wait for assist prior to mobilizing back to bed) Nurse Communication: Mobility status PT Visit Diagnosis: Muscle weakness (generalized) (M62.81);Unsteadiness on feet (R26.81);Difficulty in walking, not elsewhere classified (R26.2);Pain Pain - part of body:  (buttocks)     Time: 4259-5638 PT Time Calculation (min) (ACUTE ONLY): 20 min  Charges:  $Therapeutic Activity: 8-22 mins                    Ryon Layton E, PT Acute Rehabilitation Services Pager [phone]  Office [phone]    Arien Morine D Marketta Valadez 12/16/2019, 11:29 AM

## 2019-12-16 NOTE — Progress Notes (Addendum)
Phyllis Todd from [place] that no auth received as of 15:51  1610: CSW notified that Josem Kaufmann is received but that [facility] cannot accept until tomorrow due to it being late in the day.

## 2019-12-16 NOTE — Progress Notes (Signed)
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 coli and was on Zosyn fluconazole and Eraxis and transition to ceftriaxone and Flagyl and would need 14 days of antibiotics  Assessment & Plan:   Principal Problem:   Severe sepsis with septic shock ([place]) Active Problems:   Essential hypertension   Morbid obesity ([place])   Intra-abdominal abscess ([place])   AKI (acute kidney injury) ([place])   1. Severe sepsis secondary to intra-abdominal abscess a. Sepsis physiology is resolved b. Currently IV Unasyn changed to 11/3 to cefdinir in addition to Augmentin for total of 14 days ending on  12/19/2019 c. Will need to drain study in addition to follow-up with IR probably in about a week 2. AKI superimposed on CKD 3 AM a. BUN/creatinine probably around the same as her usual but significantly improved from admission when she had AKI b. Keep volumes even no need for IV fluid at this stage 3. Systolic diastolic heart failure combined a. Stable currently b. Resuming Coreg 25 twice daily holding losartan 50 Aldactone 25 c. Lasix 40 daily has been resumed on 11/4 4. Anemia a. PTA aspirin which will be held 5. Hypoalbuminemia 6. NASH a. Outpatient further characterization and check LFTs 7. BMI 52  DVT prophylaxis: Heparin Code Status: Full Family Communication: None Disposition:   Status is: Inpatient  Remains inpatient appropriate because:Persistent severe electrolyte disturbances, Ongoing diagnostic testing  needed not appropriate for outpatient work up and Unsafe d/c plan   Dispo: The patient is from: Home              Anticipated d/c is to: SNF              Anticipated d/c date is: 1 day              Patient currently is not medically stable to d/c.       Consultants:   Surgery  Interventional radiology  Procedures: Placement of drain  Antimicrobials: Multiple as above   Subjective: Coherent pleasant does feel little swollen lower extremities no other issues   Objective: Vitals:   12/15/19 1410 12/15/19 2053 12/16/19 0457 12/16/19 0815  BP: (!) 130/59 138/76 (!) 159/81 137/69  Pulse: 84 88 71 82  Resp: 16 18 18 19   Temp: 97.6 F (36.4 C) 97.9 F (36.6 C) 98.1 F (36.7 C) 98.3 F (36.8 C)  TempSrc:  Oral  Oral  SpO2: 97% 97% 96% 98%  Weight:      Height:        Intake/Output Summary (Last 24 hours) at 12/16/2019 5885 Last data filed at 12/16/2019 0900 Gross per 24 hour  Intake 240 ml  Output 15 ml  Net 225 ml   Filed Weights   12/08/19 0500 12/13/19 0500 12/15/19 0500  Weight: 134.1 kg (!) 137.6 kg (!) 138.7 kg    Examination:  General exam: Awake coherent alert no distress Respiratory system: Clear no rales rhonchi Cardiovascular system: S1-S2 no murmur no rub RRR Gastrointestinal system: Obese nontender has a drain in abdomen large pannus. Central nervous system: Intact moving all 4  limbs equally although limited by girth Extremities: As above Skin: No edema Psychiatry: Euthymic coherent  Data Reviewed: I have personally reviewed following labs and imaging studies Sodium 141 BUN/creatinine 28/1.8-->25/1.9-->16/1.9 WBC 19-->17.5-->15.2 Hemoglobin 9-->8.3 Platelet 359   Radiology Studies: CT ABDOMEN PELVIS WO CONTRAST  Result Date: 12/14/2019 CLINICAL DATA:  Drain placement for abscess 1 week ago, continued assessment of intra-abdominal infection EXAM: CT ABDOMEN AND PELVIS WITHOUT CONTRAST TECHNIQUE: Multidetector CT imaging of the abdomen and  pelvis was performed following the standard protocol without IV contrast. COMPARISON:  12/06/2019 FINDINGS: Lower chest: Trace bilateral pleural effusions.  Mild cardiomegaly. Hepatobiliary: Stable nonspecific 0.6 cm hypodense lesion in the right hepatic lobe on image 31 of series 3. Borderline prominence of the gallbladder. No biliary dilatation is identified. Pancreas: Unremarkable Spleen: Unremarkable Adrenals/Urinary Tract: Four nonobstructive right renal calculi are present, the largest measuring 0.8 cm in long axis. Adrenal glands normal. Stomach/Bowel: Air fluid level in the rectum indicating diarrheal process. Sigmoid colon diverticulosis, cannot exclude mild diverticulitis given the surrounding inflammatory stranding although that stranding appears to be more associated with a small bowel loop in the upper pelvis. Formed pigtail catheter at the site of the prior large abscess, small residual abscess containing gas and fluid has a somewhat flattened morphology measuring about 4.5 by 2.0 by 3.6 cm, markedly reduced in size from previous. Substantial reduction in the amount of free intraperitoneal gas, with only some tiny locules in a non dependent position, improved from 12/06/2019 although not totally resolved. Inflammatory stranding still tracks down towards the fundus of the uterus and left adnexa, although the gas and fluid level along the left adnexa is no longer appreciable. Vascular/Lymphatic: Aortoiliac atherosclerotic vascular disease. Small reactive periaortic lymph nodes. Reproductive: As noted above, the inflammatory stranding tracks down to the uterine fundus and adnexal regions, although is substantially improved from previous. Soft tissue prominence in the vicinity of the cervix, possibly from a fibroid although other causes for cervical mass are not excluded. Other: Small amount of subcutaneous gas along the right anterior abdominal wall likely from injections. Musculoskeletal: Stable erosive  and probably expansile dense lesions of the inferior articular facets at L3, the left superior articular facet at L4, and the bilateral inferior articular facets at L4. In particular, dense lesions associated with the left L3-4 facet joint measures about 2.9 by 3.9 by 3.8 cm. Associated left foraminal stenosis at L3-4. IMPRESSION: 1. Substantial reduction in the amount of free intraperitoneal gas, with only some tiny locules in a non dependent position, improved from previous. The pigtail catheter remains in place, with a small residual abscess containing gas and fluid. The abscess has markedly reduced in size from previous. 2. Sigmoid colon diverticulosis, cannot exclude mild diverticulitis given the surrounding inflammatory stranding. 3. Trace bilateral pleural effusions. 4. Mild cardiomegaly. 5. Four nonobstructive right renal calculi. 6. Erosive and probably expansile high density (likely partially calcified) lesions of the articular facets at L3 and L4. This is a highly unusual appearance with possibilities including tumoral calcinosis, gout, calcified synovial cysts, pseudogout, or amyloid. There is associated left foraminal stenosis at L3-4. 7. Soft tissue prominence in the vicinity of the cervix, possibly from a fibroid although other causes for cervical mass are not excluded. 8. Air fluid level in the rectum indicating diarrheal process. 9. Aortoiliac atherosclerotic vascular disease. Aortic Atherosclerosis (ICD10-I70.0). Electronically Signed   By: Van Clines M.D.   On: 12/14/2019 12:47     Scheduled Meds: . cefdinir  300 mg Oral Q12H  .  Chlorhexidine Gluconate Cloth  6 each Topical Daily  . guaiFENesin  600 mg Oral BID  . heparin  5,000 Units Subcutaneous Q8H  . metroNIDAZOLE  500 mg Oral Q8H  . sodium chloride flush  10-40 mL Intracatheter Q12H  . sodium chloride flush  10-40 mL Intracatheter Q12H  . sodium chloride flush  5 mL Intracatheter Q8H   Continuous Infusions: . sodium  chloride 250 mL (12/11/19 1713)     LOS: 9 days    Time spent: [place], MD [facility] To contact the attending provider between 7A-7P or the covering provider during after hours 7P-7A, please log into the web site [email or web address] and access using universal [place] password for that web site. If you do not have the password, please call the hospital operator.  12/16/2019, 9:52 AM

## 2019-12-17 ENCOUNTER — Other Ambulatory Visit: Payer: Self-pay | Admitting: Physician Assistant

## 2019-12-17 DIAGNOSIS — R52 Pain, unspecified: Secondary | ICD-10-CM | POA: Diagnosis not present

## 2019-12-17 DIAGNOSIS — I10 Essential (primary) hypertension: Secondary | ICD-10-CM | POA: Diagnosis not present

## 2019-12-17 DIAGNOSIS — K5720 Diverticulitis of large intestine with perforation and abscess without bleeding: Secondary | ICD-10-CM | POA: Diagnosis not present

## 2019-12-17 DIAGNOSIS — Z978 Presence of other specified devices: Secondary | ICD-10-CM | POA: Diagnosis not present

## 2019-12-17 DIAGNOSIS — K651 Peritoneal abscess: Secondary | ICD-10-CM

## 2019-12-17 DIAGNOSIS — K5730 Diverticulosis of large intestine without perforation or abscess without bleeding: Secondary | ICD-10-CM | POA: Diagnosis not present

## 2019-12-17 DIAGNOSIS — R531 Weakness: Secondary | ICD-10-CM | POA: Diagnosis not present

## 2019-12-17 DIAGNOSIS — I5022 Chronic systolic (congestive) heart failure: Secondary | ICD-10-CM | POA: Diagnosis not present

## 2019-12-17 DIAGNOSIS — K7581 Nonalcoholic steatohepatitis (NASH): Secondary | ICD-10-CM | POA: Diagnosis not present

## 2019-12-17 DIAGNOSIS — R6521 Severe sepsis with septic shock: Secondary | ICD-10-CM | POA: Diagnosis not present

## 2019-12-17 DIAGNOSIS — Z7401 Bed confinement status: Secondary | ICD-10-CM | POA: Diagnosis not present

## 2019-12-17 DIAGNOSIS — N179 Acute kidney failure, unspecified: Secondary | ICD-10-CM | POA: Diagnosis not present

## 2019-12-17 DIAGNOSIS — G4733 Obstructive sleep apnea (adult) (pediatric): Secondary | ICD-10-CM | POA: Diagnosis not present

## 2019-12-17 DIAGNOSIS — D649 Anemia, unspecified: Secondary | ICD-10-CM | POA: Diagnosis not present

## 2019-12-17 DIAGNOSIS — I5040 Unspecified combined systolic (congestive) and diastolic (congestive) heart failure: Secondary | ICD-10-CM | POA: Diagnosis not present

## 2019-12-17 DIAGNOSIS — I959 Hypotension, unspecified: Secondary | ICD-10-CM | POA: Diagnosis not present

## 2019-12-17 DIAGNOSIS — M2550 Pain in unspecified joint: Secondary | ICD-10-CM | POA: Diagnosis not present

## 2019-12-17 DIAGNOSIS — N1832 Chronic kidney disease, stage 3b: Secondary | ICD-10-CM | POA: Diagnosis not present

## 2019-12-17 DIAGNOSIS — I272 Pulmonary hypertension, unspecified: Secondary | ICD-10-CM | POA: Diagnosis not present

## 2019-12-17 DIAGNOSIS — D259 Leiomyoma of uterus, unspecified: Secondary | ICD-10-CM | POA: Diagnosis not present

## 2019-12-17 DIAGNOSIS — R1084 Generalized abdominal pain: Secondary | ICD-10-CM | POA: Diagnosis not present

## 2019-12-17 DIAGNOSIS — A419 Sepsis, unspecified organism: Secondary | ICD-10-CM | POA: Diagnosis not present

## 2019-12-17 DIAGNOSIS — I429 Cardiomyopathy, unspecified: Secondary | ICD-10-CM | POA: Diagnosis not present

## 2019-12-17 DIAGNOSIS — R509 Fever, unspecified: Secondary | ICD-10-CM | POA: Diagnosis not present

## 2019-12-17 DIAGNOSIS — A4189 Other specified sepsis: Secondary | ICD-10-CM | POA: Diagnosis not present

## 2019-12-17 DIAGNOSIS — N200 Calculus of kidney: Secondary | ICD-10-CM | POA: Diagnosis not present

## 2019-12-17 LAB — COMPREHENSIVE METABOLIC PANEL
ALT: 12 U/L (ref 0–44)
AST: 12 U/L — ABNORMAL LOW (ref 15–41)
Albumin: 2.1 g/dL — ABNORMAL LOW (ref 3.5–5.0)
Alkaline Phosphatase: 88 U/L (ref 38–126)
Anion gap: 7 (ref 5–15)
BUN: 13 mg/dL (ref 8–23)
CO2: 23 mmol/L (ref 22–32)
Calcium: 10.2 mg/dL (ref 8.9–10.3)
Chloride: 108 mmol/L (ref 98–111)
Creatinine, Ser: 1.83 mg/dL — ABNORMAL HIGH (ref 0.44–1.00)
GFR, Estimated: 30 mL/min — ABNORMAL LOW (ref >60)
Glucose, Bld: 107 mg/dL — ABNORMAL HIGH (ref 70–99)
Potassium: 4.2 mmol/L (ref 3.5–5.1)
Sodium: 138 mmol/L (ref 135–145)
Total Bilirubin: 0.4 mg/dL (ref 0.3–1.2)
Total Protein: 5.9 g/dL — ABNORMAL LOW (ref 6.5–8.1)

## 2019-12-17 LAB — CBC WITH DIFFERENTIAL/PLATELET
Abs Immature Granulocytes: 0.45 10*3/uL — ABNORMAL HIGH (ref 0.00–0.07)
Basophils Absolute: 0.1 10*3/uL (ref 0.0–0.1)
Basophils Relative: 1 %
Eosinophils Absolute: 0.2 10*3/uL (ref 0.0–0.5)
Eosinophils Relative: 1 %
HCT: 32 % — ABNORMAL LOW (ref 36.0–46.0)
Hemoglobin: 9.4 g/dL — ABNORMAL LOW (ref 12.0–15.0)
Immature Granulocytes: 3 %
Lymphocytes Relative: 12 %
Lymphs Abs: 1.8 10*3/uL (ref 0.7–4.0)
MCH: 31.3 pg (ref 26.0–34.0)
MCHC: 29.4 g/dL — ABNORMAL LOW (ref 30.0–36.0)
MCV: 106.7 fL — ABNORMAL HIGH (ref 80.0–100.0)
Monocytes Absolute: 1.1 10*3/uL — ABNORMAL HIGH (ref 0.1–1.0)
Monocytes Relative: 8 %
Neutro Abs: 11.2 10*3/uL — ABNORMAL HIGH (ref 1.7–7.7)
Neutrophils Relative %: 75 %
Platelets: 480 10*3/uL — ABNORMAL HIGH (ref 150–400)
RBC: 3 MIL/uL — ABNORMAL LOW (ref 3.87–5.11)
RDW: 13.7 % (ref 11.5–15.5)
WBC: 14.7 10*3/uL — ABNORMAL HIGH (ref 4.0–10.5)
nRBC: 0 % (ref 0.0–0.2)

## 2019-12-17 MED ORDER — METRONIDAZOLE 500 MG PO TABS: 500.0000 mg | ORAL_TABLET | Freq: Three times a day (TID) | ORAL | 0 refills | Status: AC

## 2019-12-17 MED ORDER — FUROSEMIDE 20 MG PO TABS: 20.0000 mg | ORAL_TABLET | Freq: Every day | ORAL | Status: AC

## 2019-12-17 MED ORDER — CEFDINIR 300 MG PO CAPS: 300.0000 mg | ORAL_CAPSULE | Freq: Two times a day (BID) | ORAL | 0 refills | Status: AC

## 2019-12-17 MED ORDER — HYDROCODONE-ACETAMINOPHEN 5-325 MG PO TABS
1.0000 | ORAL_TABLET | Freq: Four times a day (QID) | ORAL | 0 refills | Status: AC | PRN
Start: 2019-12-17 — End: ?

## 2019-12-17 NOTE — Progress Notes (Signed)
Patient discharged to [facility] via transporter, attempted to call report several times but nobody answer the call.

## 2019-12-17 NOTE — TOC Transition Note (Signed)
Transition of Care [facility]) - CM/SW Discharge Note   Patient Details  Name: Phyllis Todd MRN: [id number] Date of Birth: 08-15-1950  Transition of Care [facility]) CM/SW Contact:  Bethann Berkshire, [place] Phone Number: 12/17/2019, 9:44 AM   Clinical Narrative:     Patient will DC to: [facility] Anticipated DC date: 12/17/19 Family notified: Pt to notify family Transport by: Corey Harold   Per MD patient ready for DC to [facility] . RN, patient, and facility notified of DC. Discharge Summary and FL2 sent to facility. RN to call report prior to discharge (Room 200 [phone]). DC packet on chart. Ambulance transport requested for patient.   CSW will sign off for now as social work intervention is no longer needed. Please consult [place] again if new needs arise.   Final next level of care: [place] Barriers to Discharge: No Barriers Identified   Patient Goals and CMS Choice Patient states their goals for this hospitalization and ongoing recovery are:: [facility]      Discharge Placement              Patient chooses bed at: [facility] Patient to be transferred to facility by: [facility] and Services                                     Social Determinants of Health (SDOH) Interventions     Readmission Risk Interventions No flowsheet data found.

## 2019-12-17 NOTE — Progress Notes (Signed)
DBIV: attempted from L midline. Blood return noted but unable to draw sufficient quantity for labs. RN made aware.

## 2019-12-17 NOTE — Discharge Summary (Signed)
Physician Discharge Summary  Phyllis Todd MWN:[id number] DOB: 1950-11-13 DOA: 12/06/2019  PCP: Rubbie Battiest, RN (Inactive)  Admit date: 12/06/2019 Discharge date: 12/17/2019  Time spent: 20  minutes  Recommendations for Outpatient Follow-up:  1.  complete cefdinir and Flagyl 12/19/2019 2. Needs s CT scan per IR protocol regarding drain and follow-up in drain clinic with regards to the same removal as per them 3. Recommend CBC and Chem-7 in about 1 week 4. Reimplement diuretics and some blood pressure meds at nursing facility-discontinued ARB, Aldactone and some blood pressure medications 5. May resume aspirin once drain is removed and no other interventions are planned 6. 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 coli and was on Zosyn fluconazole and Eraxis and transition to ceftriaxone and Flagyl and would need 14 days of antibiotics  Hospital Course:  1. Severe sepsis secondary to intra-abdominal abscess a. Sepsis physiology is resolved b. Currently IV Unasyn changed to 11/3 to cefdinir in addition to Augmentin for total of 14 days ending on  12/19/2019 c. Will need to drain  study in addition to follow-up with IR probably in about a week-I will forward this to the IR personnel with regards to the same 2. AKI superimposed on CKD 3 AM a. BUN/creatinine probably around the same as her usual but significantly improved from admission when she had AKI b. Keep volumes even no need for IV fluid at this stage 3. Systolic diastolic heart failure combined a. Stable currently b. Resuming Coreg 25 twice daily c.  holding losartan 50 Aldactone 25 on discharge and may be able to reimplement slowly based on labs next week d. Lasix 20 daily has been resumed on 11/4 which is lower than her home dose of 40 mg 4. Anemia a. PTA aspirin which will be held 5. Hypoalbuminemia 6. NASH a. Outpatient further characterization and check LFTs 7. BMI 52  Procedures:  Drain placement by Dr. Kathlene Cote 12 [place] and pelvic diverticular abscess 10/26 2021   Consultations:  IR  Discharge Exam: Vitals:   12/16/19 2009 12/17/19 0501  BP: 130/66 140/70  Pulse: 86 99  Resp: 18 17  Temp: 98.3 F (36.8 C) 98.3 F (36.8 C)  SpO2: 98% 94%    General: Awake coherent no distress sitting up in bed had some nausea yesterday but otherwise is fair Cardiovascular: S1-S2 no murmur no rub no gallop sinus rhythm on monitors Respiratory: Clinically clear no added sound no rales no rhonchi Abdomen soft with drain in place in left lower quadrant no rebound no guarding Mild lower extremity edema  Discharge Instructions   Discharge Instructions    Diet - low sodium heart healthy   Complete by: As directed    Increase activity slowly   Complete  by: As directed    No wound care   Complete by: As directed      Allergies as of 12/17/2019      Reactions   Codeine Other (See Comments)   "makes me hyper"   Demerol [meperidine] Other (See Comments)   "sick"   Lisinopril Cough   Peanut-containing Drug Products Hives, Swelling   "almonds"      Medication List    STOP taking these  medications   aspirin 81 MG EC tablet   losartan 50 MG tablet Commonly known as: COZAAR   spironolactone 25 MG tablet Commonly known as: ALDACTONE     TAKE these medications   acetaminophen 500 MG tablet Commonly known as: TYLENOL Take 500 mg by mouth every 6 (six) hours as needed (pain).   carvedilol 25 MG tablet Commonly known as: COREG TAKE (1) TABLET TWICE A DAY WITH FOOD---BREAKFAST AND SUPPER. What changed: See the new instructions.   cefdinir 300 MG capsule Commonly known as: OMNICEF Take 1 capsule (300 mg total) by mouth every 12 (twelve) hours for 2 days.   furosemide 20 MG tablet Commonly known as: LASIX Take 1 tablet (20 mg total) by mouth daily. What changed:   medication strength  how much to take  additional instructions   HYDROcodone-acetaminophen 5-325 MG tablet Commonly known as: NORCO/VICODIN Take 1-2 tablets by mouth every 6 (six) hours as needed for moderate pain or severe pain.   metroNIDAZOLE 500 MG tablet Commonly known as: FLAGYL Take 1 tablet (500 mg total) by mouth every 8 (eight) hours for 2 days.      Allergies  Allergen Reactions  . Codeine Other (See Comments)    "makes me hyper"  . Demerol [Meperidine] Other (See Comments)    "sick"  . Lisinopril Cough  . Peanut-Containing Drug Products Hives and Swelling    "almonds"    Follow-up Information    Michael Boston, MD Follow up on 01/13/2020.   Specialty: General Surgery Why: 2:30 pm, arrive by 2:00pm for paperwork and check in process.  please bring photo ID and insurance card Contact information: [place] [address] [phone]                The results of significant diagnostics from this hospitalization (including imaging, microbiology, ancillary and laboratory) are listed below for reference.    Significant Diagnostic Studies: CT ABDOMEN PELVIS WO CONTRAST  Result Date: 12/14/2019 CLINICAL DATA:  Drain placement for abscess 1 week  ago, continued assessment of intra-abdominal infection EXAM: CT ABDOMEN AND PELVIS WITHOUT CONTRAST TECHNIQUE: Multidetector CT imaging of the abdomen and pelvis was performed following the standard protocol without IV contrast. COMPARISON:  12/06/2019 FINDINGS: Lower chest: Trace bilateral pleural effusions.  Mild cardiomegaly. Hepatobiliary: Stable nonspecific 0.6 cm hypodense lesion in the right hepatic lobe on image 31 of series 3. Borderline prominence of the gallbladder. No biliary dilatation is identified. Pancreas: Unremarkable Spleen: Unremarkable Adrenals/Urinary Tract: Four nonobstructive right renal calculi are present, the largest measuring 0.8 cm in long axis. Adrenal glands normal. Stomach/Bowel: Air fluid level in the rectum indicating diarrheal process. Sigmoid colon diverticulosis, cannot exclude mild diverticulitis given the surrounding inflammatory stranding although that stranding appears to be more associated with a small bowel loop in the upper pelvis. Formed pigtail catheter at the site of the prior large abscess, small residual abscess containing gas and fluid has a somewhat flattened morphology measuring about 4.5 by 2.0 by 3.6 cm, markedly reduced in  size from previous. Substantial reduction in the amount of free intraperitoneal gas, with only some tiny locules in a non dependent position, improved from 12/06/2019 although not totally resolved. Inflammatory stranding still tracks down towards the fundus of the uterus and left adnexa, although the gas and fluid level along the left adnexa is no longer appreciable. Vascular/Lymphatic: Aortoiliac atherosclerotic vascular disease. Small reactive periaortic lymph nodes. Reproductive: As noted above, the inflammatory stranding tracks down to the uterine fundus and adnexal regions, although is substantially improved from previous. Soft tissue prominence in the vicinity of the cervix, possibly from a fibroid although other causes for cervical mass  are not excluded. Other: Small amount of subcutaneous gas along the right anterior abdominal wall likely from injections. Musculoskeletal: Stable erosive and probably expansile dense lesions of the inferior articular facets at L3, the left superior articular facet at L4, and the bilateral inferior articular facets at L4. In particular, dense lesions associated with the left L3-4 facet joint measures about 2.9 by 3.9 by 3.8 cm. Associated left foraminal stenosis at L3-4. IMPRESSION: 1. Substantial reduction in the amount of free intraperitoneal gas, with only some tiny locules in a non dependent position, improved from previous. The pigtail catheter remains in place, with a small residual abscess containing gas and fluid. The abscess has markedly reduced in size from previous. 2. Sigmoid colon diverticulosis, cannot exclude mild diverticulitis given the surrounding inflammatory stranding. 3. Trace bilateral pleural effusions. 4. Mild cardiomegaly. 5. Four nonobstructive right renal calculi. 6. Erosive and probably expansile high density (likely partially calcified) lesions of the articular facets at L3 and L4. This is a highly unusual appearance with possibilities including tumoral calcinosis, gout, calcified synovial cysts, pseudogout, or amyloid. There is associated left foraminal stenosis at L3-4. 7. Soft tissue prominence in the vicinity of the cervix, possibly from a fibroid although other causes for cervical mass are not excluded. 8. Air fluid level in the rectum indicating diarrheal process. 9. Aortoiliac atherosclerotic vascular disease. Aortic Atherosclerosis (ICD10-I70.0). Electronically Signed   By: Van Clines M.D.   On: 12/14/2019 12:47   CT ABDOMEN PELVIS WO CONTRAST  Result Date: 12/06/2019 CLINICAL DATA:  Right lower quadrant abdominal pain EXAM: CT ABDOMEN AND PELVIS WITHOUT CONTRAST TECHNIQUE: Multidetector CT imaging of the abdomen and pelvis was performed following the standard  protocol without IV contrast. COMPARISON:  None. FINDINGS: Lower chest: The visualized heart size within normal limits. No pericardial fluid/thickening. No hiatal hernia. The visualized portions of the lungs are clear. Hepatobiliary: Although limited due to the lack of intravenous contrast, normal in appearance without gross focal abnormality. No evidence of calcified gallstones or biliary ductal dilatation. Pancreas:  Unremarkable.  No surrounding inflammatory changes. Spleen: Normal in size. Although limited due to the lack of intravenous contrast, normal in appearance. Adrenals/Urinary Tract: Both adrenal glands appear normal. Right-sided renal calculi are noted the largest within the midpole measures 1 cm. No right-sided hydronephrosis. No left-sided renal or collecting system calculi are seen. Abutting the superior surface of the bladder is the loculated pericolonic collection with mild wall thickening at the superior bladder which may be from the adjacent inflammatory changes. Stomach/Bowel: The stomach and proximal small bowel are unremarkable. Within the mid abdomen within a mid ileal loops there appears to be diffuse wall thickening and significant surrounding inflammatory changes. There is an adjacent air and fluid loculated collection measuring approximately 11.0 x 5.9 by 9.3 cm. There is also fat stranding changes seen around the mid sigmoid colon with diverticula.  The remainder of the colon is unremarkable. Vascular/Lymphatic: There are no enlarged abdominal or pelvic lymph nodes. Scattered mild aortic atherosclerosis is seen. Reproductive: The prostate is unremarkable. Coarse calcifications are seen within the prostate. Other: A small amount of pneumoperitoneum seen along the anterior abdominal wall and under the diaphragms. Musculoskeletal: No acute or significant osseous findings. IMPRESSION: Findings suggestive of extensive mid ileal enteritis and possible sigmoid colonic diverticulitis. There is an  adjacent multilocular fluid collection consistent with an intra-abdominal abscess measuring 11.0 x 5.9 x 9.3 cm. Small amount of pneumoperitoneum Nonobstructing right renal calculi Aortic Atherosclerosis (ICD10-I70.0). These results were called by telephone at the time of interpretation on 12/06/2019 at 9:42 pm to provider who verbally acknowledged these results. Electronically Signed   By: Prudencio Pair M.D.   On: 12/06/2019 21:43   DG CHEST PORT 1 VIEW  Result Date: 12/07/2019 CLINICAL DATA:  Central venous catheter placement EXAM: PORTABLE CHEST 1 VIEW COMPARISON:  December 06, 2019 FINDINGS: Central catheter tip is in the superior vena cava. No pneumothorax. Lungs are clear. Heart is slightly enlarged with pulmonary vascularity normal. No adenopathy. No bone lesions. IMPRESSION: Central catheter tip in superior vena cava. No pneumothorax. Lungs clear. Stable cardiac prominence. Electronically Signed   By: Lowella Grip III M.D.   On: 12/07/2019 11:41   DG Chest Portable 1 View  Result Date: 12/06/2019 CLINICAL DATA:  Hypotension. Lower abdominal pain and diarrhea. Vomiting. EXAM: PORTABLE CHEST 1 VIEW COMPARISON:  Radiograph 07/17/2013. Included portions from lung basis of abdominal CT earlier today. FINDINGS: The cardiomediastinal contours are normal. Upper normal heart size. Mild elevation of right hemidiaphragm pulmonary vasculature is normal. No consolidation, pleural effusion, or pneumothorax. No acute osseous abnormalities are seen. Free air in the abdomen on concurrent abdominal CT is not well demonstrated by radiograph. IMPRESSION: 1. No acute chest findings. 2. Mild chronic elevation of right hemidiaphragm. Electronically Signed   By: Keith Rake M.D.   On: 12/06/2019 21:46   ECHOCARDIOGRAM COMPLETE  Result Date: 12/07/2019    ECHOCARDIOGRAM REPORT   Patient Name:   Phyllis Todd Date of Exam: 12/07/2019 Medical Rec #:  [id number]   Height:       64.0 in Accession #:    [id number]   Weight:       284.8 lb Date of Birth:  1950/05/10   BSA:          2.274 m Patient Age:    37 years    BP:           95/54 mmHg Patient Gender: F           HR:           79 bpm. Exam Location:  Inpatient Procedure: 2D Echo, Cardiac Doppler, Color Doppler and Intracardiac            Opacification Agent Indications:    T06.26 Chronic systolic (congestive) heart failure  History:        Patient has prior history of Echocardiogram examinations, most                 recent 11/24/2017. Cardiomyopathy; Risk Factors:Hypertension.                 Renal Failure.  Sonographer:    Jonelle Sidle Dance Referring Phys: [id number] [place]  1. Left ventricular ejection fraction, by estimation, is 55 to 60%. The left ventricle has normal function. The left ventricle has no regional wall motion abnormalities. Left ventricular diastolic  parameters were normal.  2. Right ventricular systolic function is normal. The right ventricular size is mildly enlarged. There is normal pulmonary artery systolic pressure.  3. Left atrial size was mildly dilated.  4. The mitral valve is normal in structure. No evidence of mitral valve regurgitation. No evidence of mitral stenosis.  5. The aortic valve is normal in structure. Aortic valve regurgitation is not visualized. No aortic stenosis is present.  6. The inferior vena cava is dilated in size with >50% respiratory variability, suggesting right atrial pressure of 8 mmHg. Comparison(s): Prior images reviewed side by side. The left ventricular function is unchanged. FINDINGS  Left Ventricle: Left ventricular ejection fraction, by estimation, is 55 to 60%. The left ventricle has normal function. The left ventricle has no regional wall motion abnormalities. Definity contrast agent was given IV to delineate the left ventricular  endocardial borders. The left ventricular internal cavity size was normal in size. There is no left ventricular hypertrophy. Left ventricular diastolic parameters were  normal. Right Ventricle: The right ventricular size is mildly enlarged. No increase in right ventricular wall thickness. Right ventricular systolic function is normal. There is normal pulmonary artery systolic pressure. The tricuspid regurgitant velocity is 2.13  m/s, and with an assumed right atrial pressure of 8 mmHg, the estimated right ventricular systolic pressure is 71.2 mmHg. Left Atrium: Left atrial size was mildly dilated. Right Atrium: Right atrial size was normal in size. Pericardium: There is no evidence of pericardial effusion. Mitral Valve: The mitral valve is normal in structure. No evidence of mitral valve regurgitation. No evidence of mitral valve stenosis. Tricuspid Valve: The tricuspid valve is normal in structure. Tricuspid valve regurgitation is not demonstrated. No evidence of tricuspid stenosis. Aortic Valve: The aortic valve is normal in structure. Aortic valve regurgitation is not visualized. No aortic stenosis is present. Pulmonic Valve: The pulmonic valve was normal in structure. Pulmonic valve regurgitation is not visualized. No evidence of pulmonic stenosis. Aorta: The aortic root is normal in size and structure. Venous: The inferior vena cava is dilated in size with greater than 50% respiratory variability, suggesting right atrial pressure of 8 mmHg. IAS/Shunts: No atrial level shunt detected by color flow Doppler.  LEFT VENTRICLE PLAX 2D LVIDd:         5.23 cm  Diastology LVIDs:         3.35 cm  LV e' medial:    5.98 cm/s LV PW:         1.01 cm  LV E/e' medial:  14.4 LV IVS:        1.02 cm  LV e' lateral:   10.20 cm/s LVOT diam:     1.90 cm  LV E/e' lateral: 8.5 LV SV:         64 LV SV Index:   28 LVOT Area:     2.84 cm  RIGHT VENTRICLE             IVC RV Basal diam:  2.97 cm     IVC diam: 2.07 cm RV S prime:     12.00 cm/s TAPSE (M-mode): 2.1 cm LEFT ATRIUM             Index       RIGHT ATRIUM           Index LA diam:        4.50 cm 1.98 cm/m  RA Area:     18.20 cm LA Vol (A2C):    61.4 ml 27.01 ml/m RA  Volume:   52.20 ml  22.96 ml/m LA Vol (A4C):   70.6 ml 31.05 ml/m LA Biplane Vol: 68.4 ml 30.08 ml/m  AORTIC VALVE LVOT Vmax:   99.80 cm/s LVOT Vmean:  67.800 cm/s LVOT VTI:    0.224 m  AORTA Ao Root diam: 3.10 cm Ao Asc diam:  3.10 cm MITRAL VALVE               TRICUSPID VALVE MV Area (PHT): 3.27 cm    TR Peak grad:   18.1 mmHg MV Decel Time: 232 msec    TR Vmax:        213.00 cm/s MV E velocity: 86.40 cm/s MV A velocity: 80.40 cm/s  SHUNTS MV E/A ratio:  1.07        Systemic VTI:  0.22 m                            Systemic Diam: 1.90 cm Dani Gobble Croitoru MD Electronically signed by Sanda Klein MD Signature Date/Time: 12/07/2019/3:44:13 PM    Final    CT IMAGE GUIDED DRAINAGE BY PERCUTANEOUS CATHETER  Result Date: 12/07/2019 CLINICAL DATA:  Large diverticular abscess of the colon within the pelvis. EXAM: CT GUIDED CATHETER DRAINAGE OF PERITONEAL ABSCESS ANESTHESIA/SEDATION: 1.0 mg IV Versed 50 mcg IV Fentanyl Total Moderate Sedation Time:  22 minutes The patient's level of consciousness and physiologic status were continuously monitored during the procedure by Radiology nursing. PROCEDURE: The procedure, risks, benefits, and alternatives were explained to the patient. Questions regarding the procedure were encouraged and answered. The patient understands and consents to the procedure. A time out was performed prior to initiating the procedure. CT was performed of the lower abdomen and pelvis in a supine position. The left lower abdominal wall was prepped with chlorhexidine in a sterile fashion, and a sterile drape was applied covering the operative field. A sterile gown and sterile gloves were used for the procedure. Local anesthesia was provided with 1% Lidocaine. An 18 gauge trocar needle was advanced under CT guidance to the level of the diverticular abscess. After return of air and fluid, a guidewire was advanced into the collection and the needle removed. The percutaneous tract  was dilated. A 12 French percutaneous drainage catheter was advanced into the abscess. Fluid sample was aspirated and sent for culture analysis. The drain was connected to a suction bulb. It was secured at the skin with a Prolene retention suture and StatLock device. COMPLICATIONS: None FINDINGS: Large unilocular diverticular abscess containing air-fluid level was localized. After needle access, there was return of foul-smelling, feculent fluid. After drain placement, there is rapid return brown feculent fluid. IMPRESSION: CT-guided percutaneous drainage of diverticular abscess. A 12 French drain was placed and attached to suction bulb drainage. A sample of feculent fluid was sent for culture analysis. Electronically Signed   By: Aletta Edouard M.D.   On: 12/07/2019 16:20    Microbiology: Recent Results (from the past 240 hour(s))  Aerobic/Anaerobic Culture (surgical/deep wound)     Status: None   Collection Time: 12/07/19  3:18 PM   Specimen: Abscess  Result Value Ref Range Status   Specimen Description ABSCESS  Final   Special Requests DIVERTICULAR  Final   Gram Stain   Final    RARE WBC PRESENT, PREDOMINANTLY PMN ABUNDANT GRAM NEGATIVE RODS ABUNDANT GRAM POSITIVE COCCI IN PAIRS IN CHAINS FEW GRAM POSITIVE RODS    Culture   Final    FEW ESCHERICHIA  COLI ABUNDANT STREPTOCOCCUS ANGINOSIS ABUNDANT BACTEROIDES FRAGILIS ABUNDANT BACTEROIDES OVATUS BETA LACTAMASE POSITIVE Performed at [facility], [place] [address] [place], [place] [address]    Report Status 12/11/2019 FINAL  Final   Organism ID, Bacteria ESCHERICHIA COLI  Final   Organism ID, Bacteria STREPTOCOCCUS ANGINOSIS  Final      Susceptibility   Escherichia coli - MIC*    AMPICILLIN 4 SENSITIVE Sensitive     CEFAZOLIN <=4 SENSITIVE Sensitive     CEFEPIME <=0.12 SENSITIVE Sensitive     CEFTAZIDIME <=1 SENSITIVE Sensitive     CEFTRIAXONE <=0.25 SENSITIVE Sensitive     CIPROFLOXACIN <=0.25 SENSITIVE Sensitive      GENTAMICIN <=1 SENSITIVE Sensitive     IMIPENEM <=0.25 SENSITIVE Sensitive     TRIMETH/SULFA <=20 SENSITIVE Sensitive     AMPICILLIN/SULBACTAM <=2 SENSITIVE Sensitive     PIP/TAZO <=4 SENSITIVE Sensitive     * FEW ESCHERICHIA COLI   Streptococcus anginosis - MIC*    PENICILLIN 0.25 INTERMEDIATE Intermediate     CEFTRIAXONE 0.5 SENSITIVE Sensitive     ERYTHROMYCIN >=8 RESISTANT Resistant     LEVOFLOXACIN <=0.25 SENSITIVE Sensitive     VANCOMYCIN 0.25 SENSITIVE Sensitive     * ABUNDANT STREPTOCOCCUS ANGINOSIS  MRSA PCR Screening     Status: None   Collection Time: 12/09/19  6:26 PM   Specimen: Nasopharyngeal  Result Value Ref Range Status   MRSA by PCR NEGATIVE NEGATIVE Final    Comment:        The GeneXpert MRSA Assay (FDA approved for NASAL specimens only), is one component of a comprehensive MRSA colonization surveillance program. It is not intended to diagnose MRSA infection nor to guide or monitor treatment for MRSA infections. Performed at [facility], [place] [address], [place], [place] [address]   SARS Coronavirus 2 by RT PCR (hospital order, performed in [facility] hospital lab) Nasopharyngeal Nasopharyngeal Swab     Status: None   Collection Time: 12/15/19  3:20 PM   Specimen: Nasopharyngeal Swab  Result Value Ref Range Status   SARS Coronavirus 2 NEGATIVE NEGATIVE Final    Comment: (NOTE) SARS-CoV-2 target nucleic acids are NOT DETECTED.  The SARS-CoV-2 RNA is generally detectable in upper and lower respiratory specimens during the acute phase of infection. The lowest concentration of SARS-CoV-2 viral copies this assay can detect is 250 copies / mL. A negative result does not preclude SARS-CoV-2 infection and should not be used as the sole basis for treatment or other patient management decisions.  A negative result may occur with improper specimen collection / handling, submission of specimen other than nasopharyngeal swab, presence of viral mutation(s)  within the areas targeted by this assay, and inadequate number of viral copies (<250 copies / mL). A negative result must be combined with clinical observations, patient history, and epidemiological information.  Fact Sheet for Patients:   StrictlyIdeas.no  Fact Sheet for Healthcare Providers: BankingDealers.co.za  This test is not yet approved or  cleared by the [place] FDA and has been authorized for detection and/or diagnosis of SARS-CoV-2 by FDA under an Emergency Use Authorization (EUA).  This EUA will remain in effect (meaning this test can be used) for the duration of the COVID-19 declaration under Section 564(b)(1) of the Act, 21 U.S.C. section 360bbb-3(b)(1), unless the authorization is terminated or revoked sooner.  Performed at [facility], [place] [address], [place],  [address]      Labs: Basic Metabolic Panel: Recent Labs  Lab 12/12/19 0309 12/13/19 1101 12/14/19 0359 12/16/19 0434 12/17/19 0710  NA 142 142 141 138 138  K 4.5 4.6 4.5 4.1 4.2  CL 111 111 110 108 108  CO2 23 25 26 24 23   GLUCOSE 84 141* 113* 100* 107*  BUN 41* 28* 25* 16 13  CREATININE 2.31* 1.84* 1.96* 1.96* 1.83*  CALCIUM 10.3 10.2 10.0 9.9 10.2   Liver Function Tests: Recent Labs  Lab 12/16/19 0434 12/17/19 0710  AST 12* 12*  ALT 13 12  ALKPHOS 99 88  BILITOT 0.5 0.4  PROT 5.8* 5.9*  ALBUMIN 1.9* 2.1*   No results for input(s): LIPASE, AMYLASE in the last 168 hours. No results for input(s): AMMONIA in the last 168 hours. CBC: Recent Labs  Lab 12/12/19 0309 12/13/19 1105 12/14/19 0359 12/16/19 0434 12/17/19 0710  WBC 20.1* 19.1* 17.5* 15.2* 14.7*  NEUTROABS  --   --   --  11.6* 11.2*  HGB 9.7* 9.5* 9.0* 8.3* 9.4*  HCT 32.4* 31.8* 31.3* 28.2* 32.0*  MCV 106.6* 106.7* 110.2* 109.3* 106.7*  PLT 394 360 359 397 480*   Cardiac Enzymes: No results for input(s): CKTOTAL, CKMB, CKMBINDEX, TROPONINI in the last  168 hours. BNP: BNP (last 3 results) No results for input(s): BNP in the last 8760 hours.  ProBNP (last 3 results) No results for input(s): PROBNP in the last 8760 hours.  CBG: Recent Labs  Lab 12/10/19 1913 12/10/19 2320 12/11/19 0311 12/11/19 0838 12/11/19 1144  GLUCAP 98 89 92 92 111*       Signed:  Nita Sells MD   [facility] 12/17/2019, 9:17 AM

## 2019-12-20 ENCOUNTER — Other Ambulatory Visit: Payer: Self-pay | Admitting: Surgery

## 2019-12-20 DIAGNOSIS — I5040 Unspecified combined systolic (congestive) and diastolic (congestive) heart failure: Secondary | ICD-10-CM | POA: Diagnosis not present

## 2019-12-20 DIAGNOSIS — G4733 Obstructive sleep apnea (adult) (pediatric): Secondary | ICD-10-CM | POA: Diagnosis not present

## 2019-12-20 DIAGNOSIS — K651 Peritoneal abscess: Secondary | ICD-10-CM

## 2019-12-20 DIAGNOSIS — I10 Essential (primary) hypertension: Secondary | ICD-10-CM | POA: Diagnosis not present

## 2019-12-20 DIAGNOSIS — N179 Acute kidney failure, unspecified: Secondary | ICD-10-CM | POA: Diagnosis not present

## 2019-12-21 ENCOUNTER — Other Ambulatory Visit: Payer: Self-pay

## 2019-12-21 NOTE — Patient Outreach (Signed)
[place] [facility]) Care Management  12/21/2019  Phyllis Todd [birth year] [id number]   Referral Date: 12/21/19 Referral Source: [employer] Report Date of Discharge: 12/17/19 Facility:  [place]: [facility]   Referral received.  No outreach warranted at this time.  Transition of Care calls being completed via EMMI. RN CM will outreach patient for any red flags received.    Plan: RN CM will close case.    Jone Baseman, RN, MSN [facility] Care Management Care Management Coordinator Direct Line [phone] Toll Free: [phone]  Fax: [phone]

## 2019-12-27 DIAGNOSIS — I10 Essential (primary) hypertension: Secondary | ICD-10-CM | POA: Diagnosis not present

## 2019-12-27 DIAGNOSIS — G4733 Obstructive sleep apnea (adult) (pediatric): Secondary | ICD-10-CM | POA: Diagnosis not present

## 2019-12-27 DIAGNOSIS — K7581 Nonalcoholic steatohepatitis (NASH): Secondary | ICD-10-CM | POA: Diagnosis not present

## 2019-12-27 DIAGNOSIS — I5040 Unspecified combined systolic (congestive) and diastolic (congestive) heart failure: Secondary | ICD-10-CM | POA: Diagnosis not present

## 2019-12-30 DIAGNOSIS — N179 Acute kidney failure, unspecified: Secondary | ICD-10-CM | POA: Diagnosis not present

## 2019-12-30 DIAGNOSIS — I5040 Unspecified combined systolic (congestive) and diastolic (congestive) heart failure: Secondary | ICD-10-CM | POA: Diagnosis not present

## 2019-12-30 DIAGNOSIS — I10 Essential (primary) hypertension: Secondary | ICD-10-CM | POA: Diagnosis not present

## 2019-12-30 DIAGNOSIS — A419 Sepsis, unspecified organism: Secondary | ICD-10-CM | POA: Diagnosis not present

## 2019-12-30 DIAGNOSIS — R6521 Severe sepsis with septic shock: Secondary | ICD-10-CM | POA: Diagnosis not present

## 2020-01-03 DIAGNOSIS — I10 Essential (primary) hypertension: Secondary | ICD-10-CM | POA: Diagnosis not present

## 2020-01-03 DIAGNOSIS — R6521 Severe sepsis with septic shock: Secondary | ICD-10-CM | POA: Diagnosis not present

## 2020-01-03 DIAGNOSIS — N179 Acute kidney failure, unspecified: Secondary | ICD-10-CM | POA: Diagnosis not present

## 2020-01-03 DIAGNOSIS — I5040 Unspecified combined systolic (congestive) and diastolic (congestive) heart failure: Secondary | ICD-10-CM | POA: Diagnosis not present

## 2020-01-04 ENCOUNTER — Ambulatory Visit
Admission: RE | Admit: 2020-01-04 | Discharge: 2020-01-04 | Disposition: A | Discharge status: Home / Self Care | Payer: Medicare HMO | Source: Ambulatory Visit | Attending: Surgery | Admitting: Surgery

## 2020-01-04 ENCOUNTER — Other Ambulatory Visit: Payer: Self-pay

## 2020-01-04 ENCOUNTER — Ambulatory Visit
Admission: RE | Admit: 2020-01-04 | Discharge: 2020-01-04 | Disposition: A | Discharge status: Home / Self Care | Payer: Medicare HMO | Source: Ambulatory Visit | Attending: Physician Assistant | Admitting: Physician Assistant

## 2020-01-04 ENCOUNTER — Encounter: Payer: Self-pay | Admitting: Radiology

## 2020-01-04 DIAGNOSIS — D259 Leiomyoma of uterus, unspecified: Secondary | ICD-10-CM | POA: Diagnosis not present

## 2020-01-04 DIAGNOSIS — K651 Peritoneal abscess: Secondary | ICD-10-CM | POA: Diagnosis not present

## 2020-01-04 DIAGNOSIS — Z978 Presence of other specified devices: Secondary | ICD-10-CM | POA: Diagnosis not present

## 2020-01-04 DIAGNOSIS — K5730 Diverticulosis of large intestine without perforation or abscess without bleeding: Secondary | ICD-10-CM | POA: Diagnosis not present

## 2020-01-04 DIAGNOSIS — N200 Calculus of kidney: Secondary | ICD-10-CM | POA: Diagnosis not present

## 2020-01-04 DIAGNOSIS — R509 Fever, unspecified: Secondary | ICD-10-CM | POA: Diagnosis not present

## 2020-01-04 DIAGNOSIS — K5720 Diverticulitis of large intestine with perforation and abscess without bleeding: Secondary | ICD-10-CM | POA: Diagnosis not present

## 2020-01-04 HISTORY — PX: IR RADIOLOGIST EVAL & MGMT: IMG5224

## 2020-01-04 NOTE — Progress Notes (Signed)
Referring Physician(s): Dr Clyda Greener  Chief Complaint: The patient is seen in follow up today s/p CT-guided percutaneous drainage of diverticular abscess. A 12 French drain was placed 12/07/19.  History of present illness:   Large diverticular abscess of the colon within the pelvis Drain placed in IR 12/07/19  Has done well since placement Finished antibiotic few days ago. Continues to flush daily 5 cc sterile saline Very little to no output x 3 days  Here today for CT and evaluation of collection Denies fever; chills Denies pain Denies N/V  Pt to see Dr Johney Maine 01/13/20  Past Medical History:  Diagnosis Date  . Acute combined systolic and diastolic heart failure ([place]) 06/2013   Class 3 - ECHO EF 35-40%  . Acute renal failure ([place])   . BMI 60.0-69.9, adult ([place])   . CHF (congestive heart failure) ([place]) 06/2013  . Hypertension   . Morbid obesity ([place])   . NICM (nonischemic cardiomyopathy) ([place]) 06/2013  . Obstructive sleep apnea (adult) (pediatric) 06/2013  . Other ascites 06/2013  . Other chronic nonalcoholic liver disease 08/4079    Past Surgical History:  Procedure Laterality Date  . CARDIAC CATHETERIZATION  2015  . CESAREAN SECTION    . DILATION AND CURETTAGE OF UTERUS  1984  . LEFT HEART CATHETERIZATION WITH CORONARY ANGIOGRAM N/A 07/12/2013   Procedure: LEFT HEART CATHETERIZATION WITH CORONARY ANGIOGRAM;  Surgeon: Troy Sine, MD;  Location: [facility] CATH LAB;  Service: Cardiovascular;  Laterality: N/A;  . TONSILLECTOMY      Allergies: Codeine, Demerol [meperidine], Lisinopril, and Peanut-containing drug products  Medications: Prior to Admission medications   Medication Sig Start Date End Date Taking? Authorizing Provider  acetaminophen (TYLENOL) 500 MG tablet Take 500 mg by mouth every 6 (six) hours as needed (pain).    [provider]  carvedilol (COREG) 25 MG tablet TAKE (1) TABLET TWICE A DAY WITH FOOD---BREAKFAST AND SUPPER. Patient taking differently:  Take 25 mg by mouth 2 (two) times daily with a meal.  02/24/19   Larey Dresser, MD  furosemide (LASIX) 20 MG tablet Take 1 tablet (20 mg total) by mouth daily. 12/17/19   Nita Sells, MD  HYDROcodone-acetaminophen (NORCO/VICODIN) 5-325 MG tablet Take 1-2 tablets by mouth every 6 (six) hours as needed for moderate pain or severe pain. 12/17/19   Nita Sells, MD     Family History  Problem Relation Age of Onset  . Hypertension Mother   . Heart failure Mother   . Stroke Mother   . CAD Father   . Hypertension Father   . Stroke Brother 78  . Diabetes Brother   . Hypertension Brother   . Breast cancer Maternal Grandmother     Social History   Socioeconomic History  . Marital status: Divorced    Spouse name: Not on file  . Number of children: 1  . Years of education: 44  . Highest education level: Not on file  Occupational History  . 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 Smoking status: Never Smoker  . Smokeless tobacco: Never Used  Substance and Sexual Activity  . Alcohol use: Yes    Comment: occas  . Drug use: No  . Sexual activity: Not on file  Other Topics Concern  . Not on file  Social History Narrative   Phyllis Todd grew up in [place], [place]. She attended [employer] and obtained her Bachelor's in Literature. She is divorced and has 1 daughter Phyllis Todd).  Hobbies: crochet and gardening.   Social Determinants of Health   Financial Resource Strain:   . Difficulty of Paying Living Expenses: Not on file  Food Insecurity:   . Worried About Charity fundraiser in the Last Year: Not on file  . Ran Out of Food in the Last Year: Not on file  Transportation Needs:   . Lack of Transportation (Medical): Not on file  . Lack of Transportation (Non-Medical): Not on file  Physical Activity:   . Days of Exercise per Week: Not on file  . Minutes of Exercise per Session: Not on file  Stress:   . Feeling of Stress : Not on file  Social  Connections:   . Frequency of Communication with Friends and Family: Not on file  . Frequency of Social Gatherings with Friends and Family: Not on file  . Attends Religious Services: Not on file  . Active Member of Clubs or Organizations: Not on file  . Attends Archivist Meetings: Not on file  . Marital Status: Not on file     Vital Signs: There were no vitals taken for this visit.  Physical Exam Skin:    General: Skin is warm.     Comments: Site of drain is clean and dry NT no bleeding No sign of infection OP minimal in JP-- blood tinged  CT shows resolution of collection per Dr Pascal Lux Injection show NO fistula to bowel  Drain removal with dressing placed     Imaging: No results found.  Labs:  CBC: Recent Labs    12/13/19 1105 12/14/19 0359 12/16/19 0434 12/17/19 0710  WBC 19.1* 17.5* 15.2* 14.7*  HGB 9.5* 9.0* 8.3* 9.4*  HCT 31.8* 31.3* 28.2* 32.0*  PLT 360 359 397 480*    COAGS: Recent Labs    12/07/19 1158  INR 1.2    BMP: Recent Labs    12/13/19 1101 12/14/19 0359 12/16/19 0434 12/17/19 0710  NA 142 141 138 138  K 4.6 4.5 4.1 4.2  CL 111 110 108 108  CO2 25 26 24 23   GLUCOSE 141* 113* 100* 107*  BUN 28* 25* 16 13  CALCIUM 10.2 10.0 9.9 10.2  CREATININE 1.84* 1.96* 1.96* 1.83*  GFRNONAA 30* 28* 28* 30*    LIVER FUNCTION TESTS: Recent Labs    12/08/19 0549 12/09/19 0220 12/16/19 0434 12/17/19 0710  BILITOT 1.5* 1.0 0.5 0.4  AST 11* 10* 12* 12*  ALT 10 12 13 12   ALKPHOS 108 96 99 88  PROT 6.1* 6.1* 5.8* 5.9*  ALBUMIN 1.8* 1.8* 1.9* 2.1*    Assessment:  LLQ diverticular abscess Drain placed in IR 10/26 Doing well Little to no OP for days CT showing resolution of collection today Drain injection revealing NO fistula Drain removal per Dr Pascal Lux Pt to follow up with Dr Johney Maine 01/13/20 She has good understanding of plan    Signed: Lavonia Drafts, PA-C 01/04/2020, 1:21 PM   Please refer to Dr. Pascal Lux  attestation of this note for management and plan.

## 2020-01-05 ENCOUNTER — Ambulatory Visit: Payer: Self-pay | Admitting: Surgery

## 2020-01-05 ENCOUNTER — Encounter: Payer: Self-pay | Admitting: Surgery

## 2020-01-07 DIAGNOSIS — R6521 Severe sepsis with septic shock: Secondary | ICD-10-CM | POA: Diagnosis not present

## 2020-01-07 DIAGNOSIS — I272 Pulmonary hypertension, unspecified: Secondary | ICD-10-CM | POA: Diagnosis not present

## 2020-01-07 DIAGNOSIS — I10 Essential (primary) hypertension: Secondary | ICD-10-CM | POA: Diagnosis not present

## 2020-01-07 DIAGNOSIS — G4733 Obstructive sleep apnea (adult) (pediatric): Secondary | ICD-10-CM | POA: Diagnosis not present

## 2020-01-08 DIAGNOSIS — D631 Anemia in chronic kidney disease: Secondary | ICD-10-CM | POA: Diagnosis not present

## 2020-01-08 DIAGNOSIS — I130 Hypertensive heart and chronic kidney disease with heart failure and stage 1 through stage 4 chronic kidney disease, or unspecified chronic kidney disease: Secondary | ICD-10-CM | POA: Diagnosis not present

## 2020-01-08 DIAGNOSIS — M1030 Gout due to renal impairment, unspecified site: Secondary | ICD-10-CM | POA: Diagnosis not present

## 2020-01-08 DIAGNOSIS — I5040 Unspecified combined systolic (congestive) and diastolic (congestive) heart failure: Secondary | ICD-10-CM | POA: Diagnosis not present

## 2020-01-08 DIAGNOSIS — M48061 Spinal stenosis, lumbar region without neurogenic claudication: Secondary | ICD-10-CM | POA: Diagnosis not present

## 2020-01-08 DIAGNOSIS — I5042 Chronic combined systolic (congestive) and diastolic (congestive) heart failure: Secondary | ICD-10-CM | POA: Diagnosis not present

## 2020-01-08 DIAGNOSIS — I272 Pulmonary hypertension, unspecified: Secondary | ICD-10-CM | POA: Diagnosis not present

## 2020-01-08 DIAGNOSIS — M7120 Synovial cyst of popliteal space [Baker], unspecified knee: Secondary | ICD-10-CM | POA: Diagnosis not present

## 2020-01-08 DIAGNOSIS — N179 Acute kidney failure, unspecified: Secondary | ICD-10-CM | POA: Diagnosis not present

## 2020-01-08 DIAGNOSIS — N183 Chronic kidney disease, stage 3 unspecified: Secondary | ICD-10-CM | POA: Diagnosis not present

## 2020-01-11 ENCOUNTER — Other Ambulatory Visit: Payer: Self-pay

## 2020-01-11 DIAGNOSIS — D631 Anemia in chronic kidney disease: Secondary | ICD-10-CM | POA: Diagnosis not present

## 2020-01-11 DIAGNOSIS — I272 Pulmonary hypertension, unspecified: Secondary | ICD-10-CM | POA: Diagnosis not present

## 2020-01-11 DIAGNOSIS — M1030 Gout due to renal impairment, unspecified site: Secondary | ICD-10-CM | POA: Diagnosis not present

## 2020-01-11 DIAGNOSIS — N183 Chronic kidney disease, stage 3 unspecified: Secondary | ICD-10-CM | POA: Diagnosis not present

## 2020-01-11 DIAGNOSIS — M7120 Synovial cyst of popliteal space [Baker], unspecified knee: Secondary | ICD-10-CM | POA: Diagnosis not present

## 2020-01-11 DIAGNOSIS — M48061 Spinal stenosis, lumbar region without neurogenic claudication: Secondary | ICD-10-CM | POA: Diagnosis not present

## 2020-01-11 DIAGNOSIS — I5042 Chronic combined systolic (congestive) and diastolic (congestive) heart failure: Secondary | ICD-10-CM | POA: Diagnosis not present

## 2020-01-11 DIAGNOSIS — I130 Hypertensive heart and chronic kidney disease with heart failure and stage 1 through stage 4 chronic kidney disease, or unspecified chronic kidney disease: Secondary | ICD-10-CM | POA: Diagnosis not present

## 2020-01-11 DIAGNOSIS — N179 Acute kidney failure, unspecified: Secondary | ICD-10-CM | POA: Diagnosis not present

## 2020-01-11 NOTE — Patient Outreach (Signed)
[place] [facility]) Care Management  01/11/2020  Phyllis Todd 23-Aug-1950 [id number]   Referral Date: 01/10/20 Referral Source: [employer] Report Date of Discharge: 01/07/20 Facility:  [place]: [facility]   Referral received.  No outreach warranted at this time.  Transition of Care calls being completed via EMMI. RN CM will outreach patient for any red flags received.    Plan: RN CM will close case.    Jone Baseman, RN, MSN [facility] Care Management Care Management Coordinator Direct Line [phone] Toll Free: [phone]  Fax: [phone]

## 2020-01-12 DIAGNOSIS — I5042 Chronic combined systolic (congestive) and diastolic (congestive) heart failure: Secondary | ICD-10-CM | POA: Diagnosis not present

## 2020-01-12 DIAGNOSIS — M48061 Spinal stenosis, lumbar region without neurogenic claudication: Secondary | ICD-10-CM | POA: Diagnosis not present

## 2020-01-12 DIAGNOSIS — I130 Hypertensive heart and chronic kidney disease with heart failure and stage 1 through stage 4 chronic kidney disease, or unspecified chronic kidney disease: Secondary | ICD-10-CM | POA: Diagnosis not present

## 2020-01-12 DIAGNOSIS — N183 Chronic kidney disease, stage 3 unspecified: Secondary | ICD-10-CM | POA: Diagnosis not present

## 2020-01-12 DIAGNOSIS — N179 Acute kidney failure, unspecified: Secondary | ICD-10-CM | POA: Diagnosis not present

## 2020-01-12 DIAGNOSIS — D631 Anemia in chronic kidney disease: Secondary | ICD-10-CM | POA: Diagnosis not present

## 2020-01-12 DIAGNOSIS — M1030 Gout due to renal impairment, unspecified site: Secondary | ICD-10-CM | POA: Diagnosis not present

## 2020-01-12 DIAGNOSIS — M7120 Synovial cyst of popliteal space [Baker], unspecified knee: Secondary | ICD-10-CM | POA: Diagnosis not present

## 2020-01-12 DIAGNOSIS — I272 Pulmonary hypertension, unspecified: Secondary | ICD-10-CM | POA: Diagnosis not present

## 2020-01-13 ENCOUNTER — Telehealth (HOSPITAL_COMMUNITY): Payer: Self-pay | Admitting: Cardiology

## 2020-01-13 ENCOUNTER — Ambulatory Visit: Payer: Self-pay | Admitting: Surgery

## 2020-01-13 DIAGNOSIS — K5909 Other constipation: Secondary | ICD-10-CM | POA: Diagnosis not present

## 2020-01-13 DIAGNOSIS — I509 Heart failure, unspecified: Secondary | ICD-10-CM | POA: Diagnosis not present

## 2020-01-13 DIAGNOSIS — K5720 Diverticulitis of large intestine with perforation and abscess without bleeding: Secondary | ICD-10-CM | POA: Diagnosis not present

## 2020-01-13 DIAGNOSIS — Z6841 Body Mass Index (BMI) 40.0 and over, adult: Secondary | ICD-10-CM | POA: Diagnosis not present

## 2020-01-13 NOTE — Telephone Encounter (Signed)
When?

## 2020-01-14 DIAGNOSIS — I130 Hypertensive heart and chronic kidney disease with heart failure and stage 1 through stage 4 chronic kidney disease, or unspecified chronic kidney disease: Secondary | ICD-10-CM | POA: Diagnosis not present

## 2020-01-14 DIAGNOSIS — I272 Pulmonary hypertension, unspecified: Secondary | ICD-10-CM | POA: Diagnosis not present

## 2020-01-14 DIAGNOSIS — M48061 Spinal stenosis, lumbar region without neurogenic claudication: Secondary | ICD-10-CM | POA: Diagnosis not present

## 2020-01-14 DIAGNOSIS — M1030 Gout due to renal impairment, unspecified site: Secondary | ICD-10-CM | POA: Diagnosis not present

## 2020-01-14 DIAGNOSIS — N179 Acute kidney failure, unspecified: Secondary | ICD-10-CM | POA: Diagnosis not present

## 2020-01-14 DIAGNOSIS — N183 Chronic kidney disease, stage 3 unspecified: Secondary | ICD-10-CM | POA: Diagnosis not present

## 2020-01-14 DIAGNOSIS — M7120 Synovial cyst of popliteal space [Baker], unspecified knee: Secondary | ICD-10-CM | POA: Diagnosis not present

## 2020-01-14 DIAGNOSIS — I5042 Chronic combined systolic (congestive) and diastolic (congestive) heart failure: Secondary | ICD-10-CM | POA: Diagnosis not present

## 2020-01-14 DIAGNOSIS — D631 Anemia in chronic kidney disease: Secondary | ICD-10-CM | POA: Diagnosis not present

## 2020-01-18 ENCOUNTER — Encounter (HOSPITAL_COMMUNITY): Payer: Medicare HMO | Admitting: Cardiology

## 2020-01-18 ENCOUNTER — Telehealth (HOSPITAL_COMMUNITY): Payer: Self-pay | Admitting: Cardiology

## 2020-01-18 DIAGNOSIS — I272 Pulmonary hypertension, unspecified: Secondary | ICD-10-CM | POA: Diagnosis not present

## 2020-01-18 DIAGNOSIS — I130 Hypertensive heart and chronic kidney disease with heart failure and stage 1 through stage 4 chronic kidney disease, or unspecified chronic kidney disease: Secondary | ICD-10-CM | POA: Diagnosis not present

## 2020-01-18 DIAGNOSIS — M48061 Spinal stenosis, lumbar region without neurogenic claudication: Secondary | ICD-10-CM | POA: Diagnosis not present

## 2020-01-18 DIAGNOSIS — M7120 Synovial cyst of popliteal space [Baker], unspecified knee: Secondary | ICD-10-CM | POA: Diagnosis not present

## 2020-01-18 DIAGNOSIS — D631 Anemia in chronic kidney disease: Secondary | ICD-10-CM | POA: Diagnosis not present

## 2020-01-18 DIAGNOSIS — N183 Chronic kidney disease, stage 3 unspecified: Secondary | ICD-10-CM | POA: Diagnosis not present

## 2020-01-18 DIAGNOSIS — M1030 Gout due to renal impairment, unspecified site: Secondary | ICD-10-CM | POA: Diagnosis not present

## 2020-01-18 DIAGNOSIS — I5042 Chronic combined systolic (congestive) and diastolic (congestive) heart failure: Secondary | ICD-10-CM | POA: Diagnosis not present

## 2020-01-18 DIAGNOSIS — N179 Acute kidney failure, unspecified: Secondary | ICD-10-CM | POA: Diagnosis not present

## 2020-01-18 NOTE — Telephone Encounter (Signed)
Per Dr.McLean pt needs to be seen in office for clearance and needs to see APP clinic. Called pt to inform she did not answer left detailed VM and cancelled appt with Dr.McLean.

## 2020-01-18 NOTE — Telephone Encounter (Signed)
Pt has a 3:40 appt with DM today for surgical clearance, she is asking if she can change it to a phone visit. Please advise

## 2020-01-20 DIAGNOSIS — I272 Pulmonary hypertension, unspecified: Secondary | ICD-10-CM | POA: Diagnosis not present

## 2020-01-20 DIAGNOSIS — N179 Acute kidney failure, unspecified: Secondary | ICD-10-CM | POA: Diagnosis not present

## 2020-01-20 DIAGNOSIS — M1030 Gout due to renal impairment, unspecified site: Secondary | ICD-10-CM | POA: Diagnosis not present

## 2020-01-20 DIAGNOSIS — M48061 Spinal stenosis, lumbar region without neurogenic claudication: Secondary | ICD-10-CM | POA: Diagnosis not present

## 2020-01-20 DIAGNOSIS — I5042 Chronic combined systolic (congestive) and diastolic (congestive) heart failure: Secondary | ICD-10-CM | POA: Diagnosis not present

## 2020-01-20 DIAGNOSIS — N183 Chronic kidney disease, stage 3 unspecified: Secondary | ICD-10-CM | POA: Diagnosis not present

## 2020-01-20 DIAGNOSIS — I130 Hypertensive heart and chronic kidney disease with heart failure and stage 1 through stage 4 chronic kidney disease, or unspecified chronic kidney disease: Secondary | ICD-10-CM | POA: Diagnosis not present

## 2020-01-20 DIAGNOSIS — D631 Anemia in chronic kidney disease: Secondary | ICD-10-CM | POA: Diagnosis not present

## 2020-01-20 DIAGNOSIS — M7120 Synovial cyst of popliteal space [Baker], unspecified knee: Secondary | ICD-10-CM | POA: Diagnosis not present

## 2020-01-25 DIAGNOSIS — M1030 Gout due to renal impairment, unspecified site: Secondary | ICD-10-CM | POA: Diagnosis not present

## 2020-01-25 DIAGNOSIS — I272 Pulmonary hypertension, unspecified: Secondary | ICD-10-CM | POA: Diagnosis not present

## 2020-01-25 DIAGNOSIS — M7120 Synovial cyst of popliteal space [Baker], unspecified knee: Secondary | ICD-10-CM | POA: Diagnosis not present

## 2020-01-25 DIAGNOSIS — M48061 Spinal stenosis, lumbar region without neurogenic claudication: Secondary | ICD-10-CM | POA: Diagnosis not present

## 2020-01-25 DIAGNOSIS — N179 Acute kidney failure, unspecified: Secondary | ICD-10-CM | POA: Diagnosis not present

## 2020-01-25 DIAGNOSIS — I5042 Chronic combined systolic (congestive) and diastolic (congestive) heart failure: Secondary | ICD-10-CM | POA: Diagnosis not present

## 2020-01-25 DIAGNOSIS — I130 Hypertensive heart and chronic kidney disease with heart failure and stage 1 through stage 4 chronic kidney disease, or unspecified chronic kidney disease: Secondary | ICD-10-CM | POA: Diagnosis not present

## 2020-01-25 DIAGNOSIS — N183 Chronic kidney disease, stage 3 unspecified: Secondary | ICD-10-CM | POA: Diagnosis not present

## 2020-01-25 DIAGNOSIS — D631 Anemia in chronic kidney disease: Secondary | ICD-10-CM | POA: Diagnosis not present

## 2020-01-27 DIAGNOSIS — N179 Acute kidney failure, unspecified: Secondary | ICD-10-CM | POA: Diagnosis not present

## 2020-01-27 DIAGNOSIS — M48061 Spinal stenosis, lumbar region without neurogenic claudication: Secondary | ICD-10-CM | POA: Diagnosis not present

## 2020-01-27 DIAGNOSIS — M1030 Gout due to renal impairment, unspecified site: Secondary | ICD-10-CM | POA: Diagnosis not present

## 2020-01-27 DIAGNOSIS — I130 Hypertensive heart and chronic kidney disease with heart failure and stage 1 through stage 4 chronic kidney disease, or unspecified chronic kidney disease: Secondary | ICD-10-CM | POA: Diagnosis not present

## 2020-01-27 DIAGNOSIS — I272 Pulmonary hypertension, unspecified: Secondary | ICD-10-CM | POA: Diagnosis not present

## 2020-01-27 DIAGNOSIS — D631 Anemia in chronic kidney disease: Secondary | ICD-10-CM | POA: Diagnosis not present

## 2020-01-27 DIAGNOSIS — I5042 Chronic combined systolic (congestive) and diastolic (congestive) heart failure: Secondary | ICD-10-CM | POA: Diagnosis not present

## 2020-01-27 DIAGNOSIS — N183 Chronic kidney disease, stage 3 unspecified: Secondary | ICD-10-CM | POA: Diagnosis not present

## 2020-01-27 DIAGNOSIS — M7120 Synovial cyst of popliteal space [Baker], unspecified knee: Secondary | ICD-10-CM | POA: Diagnosis not present

## 2020-01-28 ENCOUNTER — Encounter: Payer: Self-pay | Admitting: Gastroenterology

## 2020-02-01 DIAGNOSIS — M1030 Gout due to renal impairment, unspecified site: Secondary | ICD-10-CM | POA: Diagnosis not present

## 2020-02-01 DIAGNOSIS — I272 Pulmonary hypertension, unspecified: Secondary | ICD-10-CM | POA: Diagnosis not present

## 2020-02-01 DIAGNOSIS — D631 Anemia in chronic kidney disease: Secondary | ICD-10-CM | POA: Diagnosis not present

## 2020-02-01 DIAGNOSIS — I130 Hypertensive heart and chronic kidney disease with heart failure and stage 1 through stage 4 chronic kidney disease, or unspecified chronic kidney disease: Secondary | ICD-10-CM | POA: Diagnosis not present

## 2020-02-01 DIAGNOSIS — N183 Chronic kidney disease, stage 3 unspecified: Secondary | ICD-10-CM | POA: Diagnosis not present

## 2020-02-01 DIAGNOSIS — M48061 Spinal stenosis, lumbar region without neurogenic claudication: Secondary | ICD-10-CM | POA: Diagnosis not present

## 2020-02-01 DIAGNOSIS — N179 Acute kidney failure, unspecified: Secondary | ICD-10-CM | POA: Diagnosis not present

## 2020-02-01 DIAGNOSIS — I5042 Chronic combined systolic (congestive) and diastolic (congestive) heart failure: Secondary | ICD-10-CM | POA: Diagnosis not present

## 2020-02-01 DIAGNOSIS — M7120 Synovial cyst of popliteal space [Baker], unspecified knee: Secondary | ICD-10-CM | POA: Diagnosis not present

## 2020-02-02 ENCOUNTER — Ambulatory Visit: Payer: Medicare HMO | Admitting: Gastroenterology

## 2020-02-02 ENCOUNTER — Encounter: Payer: Self-pay | Admitting: *Deleted

## 2020-02-06 DIAGNOSIS — I5022 Chronic systolic (congestive) heart failure: Secondary | ICD-10-CM | POA: Diagnosis not present

## 2020-02-06 DIAGNOSIS — I10 Essential (primary) hypertension: Secondary | ICD-10-CM | POA: Diagnosis not present

## 2020-02-17 ENCOUNTER — Encounter (HOSPITAL_COMMUNITY): Payer: Medicare HMO

## 2020-02-20 ENCOUNTER — Inpatient Hospital Stay (HOSPITAL_COMMUNITY)
Admission: EM | Admit: 2020-02-20 | Discharge: 2020-03-14 | DRG: 871 | Disposition: E | Payer: Medicare HMO | Attending: Pulmonary Disease | Admitting: Pulmonary Disease

## 2020-02-20 ENCOUNTER — Other Ambulatory Visit: Payer: Self-pay

## 2020-02-20 ENCOUNTER — Emergency Department (HOSPITAL_COMMUNITY): Payer: Medicare HMO

## 2020-02-20 ENCOUNTER — Encounter (HOSPITAL_COMMUNITY): Payer: Self-pay | Admitting: Emergency Medicine

## 2020-02-20 DIAGNOSIS — D75838 Other thrombocytosis: Secondary | ICD-10-CM | POA: Diagnosis present

## 2020-02-20 DIAGNOSIS — J9621 Acute and chronic respiratory failure with hypoxia: Secondary | ICD-10-CM | POA: Diagnosis present

## 2020-02-20 DIAGNOSIS — D75839 Thrombocytosis, unspecified: Secondary | ICD-10-CM

## 2020-02-20 DIAGNOSIS — N828 Other female genital tract fistulae: Secondary | ICD-10-CM | POA: Diagnosis present

## 2020-02-20 DIAGNOSIS — R6520 Severe sepsis without septic shock: Secondary | ICD-10-CM | POA: Diagnosis not present

## 2020-02-20 DIAGNOSIS — E43 Unspecified severe protein-calorie malnutrition: Secondary | ICD-10-CM | POA: Diagnosis present

## 2020-02-20 DIAGNOSIS — E872 Acidosis: Secondary | ICD-10-CM | POA: Diagnosis present

## 2020-02-20 DIAGNOSIS — R609 Edema, unspecified: Secondary | ICD-10-CM | POA: Diagnosis not present

## 2020-02-20 DIAGNOSIS — G9341 Metabolic encephalopathy: Secondary | ICD-10-CM | POA: Diagnosis present

## 2020-02-20 DIAGNOSIS — N1831 Chronic kidney disease, stage 3a: Secondary | ICD-10-CM | POA: Diagnosis present

## 2020-02-20 DIAGNOSIS — G4733 Obstructive sleep apnea (adult) (pediatric): Secondary | ICD-10-CM | POA: Diagnosis present

## 2020-02-20 DIAGNOSIS — J9811 Atelectasis: Secondary | ICD-10-CM | POA: Diagnosis not present

## 2020-02-20 DIAGNOSIS — J9622 Acute and chronic respiratory failure with hypercapnia: Secondary | ICD-10-CM | POA: Diagnosis present

## 2020-02-20 DIAGNOSIS — Z66 Do not resuscitate: Secondary | ICD-10-CM | POA: Diagnosis present

## 2020-02-20 DIAGNOSIS — Z515 Encounter for palliative care: Secondary | ICD-10-CM

## 2020-02-20 DIAGNOSIS — M48061 Spinal stenosis, lumbar region without neurogenic claudication: Secondary | ICD-10-CM | POA: Diagnosis not present

## 2020-02-20 DIAGNOSIS — K5730 Diverticulosis of large intestine without perforation or abscess without bleeding: Secondary | ICD-10-CM | POA: Diagnosis not present

## 2020-02-20 DIAGNOSIS — N824 Other female intestinal-genital tract fistulae: Secondary | ICD-10-CM | POA: Diagnosis present

## 2020-02-20 DIAGNOSIS — E1122 Type 2 diabetes mellitus with diabetic chronic kidney disease: Secondary | ICD-10-CM | POA: Diagnosis present

## 2020-02-20 DIAGNOSIS — R1012 Left upper quadrant pain: Secondary | ICD-10-CM

## 2020-02-20 DIAGNOSIS — G928 Other toxic encephalopathy: Secondary | ICD-10-CM | POA: Diagnosis present

## 2020-02-20 DIAGNOSIS — E662 Morbid (severe) obesity with alveolar hypoventilation: Secondary | ICD-10-CM | POA: Diagnosis present

## 2020-02-20 DIAGNOSIS — I428 Other cardiomyopathies: Secondary | ICD-10-CM | POA: Diagnosis present

## 2020-02-20 DIAGNOSIS — I10 Essential (primary) hypertension: Secondary | ICD-10-CM | POA: Diagnosis present

## 2020-02-20 DIAGNOSIS — R7989 Other specified abnormal findings of blood chemistry: Secondary | ICD-10-CM

## 2020-02-20 DIAGNOSIS — A419 Sepsis, unspecified organism: Principal | ICD-10-CM | POA: Diagnosis present

## 2020-02-20 DIAGNOSIS — N179 Acute kidney failure, unspecified: Secondary | ICD-10-CM | POA: Diagnosis not present

## 2020-02-20 DIAGNOSIS — K651 Peritoneal abscess: Secondary | ICD-10-CM | POA: Diagnosis not present

## 2020-02-20 DIAGNOSIS — R10817 Generalized abdominal tenderness: Secondary | ICD-10-CM | POA: Diagnosis not present

## 2020-02-20 DIAGNOSIS — E8809 Other disorders of plasma-protein metabolism, not elsewhere classified: Secondary | ICD-10-CM

## 2020-02-20 DIAGNOSIS — Z20822 Contact with and (suspected) exposure to covid-19: Secondary | ICD-10-CM | POA: Diagnosis present

## 2020-02-20 DIAGNOSIS — Z6841 Body Mass Index (BMI) 40.0 and over, adult: Secondary | ICD-10-CM

## 2020-02-20 DIAGNOSIS — I130 Hypertensive heart and chronic kidney disease with heart failure and stage 1 through stage 4 chronic kidney disease, or unspecified chronic kidney disease: Secondary | ICD-10-CM | POA: Diagnosis present

## 2020-02-20 DIAGNOSIS — J90 Pleural effusion, not elsewhere classified: Secondary | ICD-10-CM | POA: Diagnosis not present

## 2020-02-20 DIAGNOSIS — I5043 Acute on chronic combined systolic (congestive) and diastolic (congestive) heart failure: Secondary | ICD-10-CM

## 2020-02-20 DIAGNOSIS — R935 Abnormal findings on diagnostic imaging of other abdominal regions, including retroperitoneum: Secondary | ICD-10-CM | POA: Diagnosis not present

## 2020-02-20 DIAGNOSIS — D735 Infarction of spleen: Secondary | ICD-10-CM | POA: Diagnosis present

## 2020-02-20 DIAGNOSIS — D259 Leiomyoma of uterus, unspecified: Secondary | ICD-10-CM | POA: Diagnosis not present

## 2020-02-20 DIAGNOSIS — D7389 Other diseases of spleen: Secondary | ICD-10-CM | POA: Diagnosis not present

## 2020-02-20 DIAGNOSIS — R0689 Other abnormalities of breathing: Secondary | ICD-10-CM | POA: Diagnosis not present

## 2020-02-20 DIAGNOSIS — N170 Acute kidney failure with tubular necrosis: Secondary | ICD-10-CM | POA: Diagnosis present

## 2020-02-20 DIAGNOSIS — N200 Calculus of kidney: Secondary | ICD-10-CM | POA: Diagnosis not present

## 2020-02-20 DIAGNOSIS — R531 Weakness: Secondary | ICD-10-CM | POA: Diagnosis not present

## 2020-02-20 DIAGNOSIS — J9600 Acute respiratory failure, unspecified whether with hypoxia or hypercapnia: Secondary | ICD-10-CM | POA: Diagnosis not present

## 2020-02-20 DIAGNOSIS — E46 Unspecified protein-calorie malnutrition: Secondary | ICD-10-CM | POA: Diagnosis present

## 2020-02-20 DIAGNOSIS — I2510 Atherosclerotic heart disease of native coronary artery without angina pectoris: Secondary | ICD-10-CM | POA: Diagnosis present

## 2020-02-20 DIAGNOSIS — I5022 Chronic systolic (congestive) heart failure: Secondary | ICD-10-CM | POA: Diagnosis present

## 2020-02-20 DIAGNOSIS — K7581 Nonalcoholic steatohepatitis (NASH): Secondary | ICD-10-CM | POA: Diagnosis present

## 2020-02-20 DIAGNOSIS — D539 Nutritional anemia, unspecified: Secondary | ICD-10-CM

## 2020-02-20 DIAGNOSIS — M533 Sacrococcygeal disorders, not elsewhere classified: Secondary | ICD-10-CM | POA: Diagnosis not present

## 2020-02-20 DIAGNOSIS — R059 Cough, unspecified: Secondary | ICD-10-CM | POA: Diagnosis not present

## 2020-02-20 DIAGNOSIS — M545 Low back pain, unspecified: Secondary | ICD-10-CM | POA: Diagnosis not present

## 2020-02-20 DIAGNOSIS — J80 Acute respiratory distress syndrome: Secondary | ICD-10-CM | POA: Diagnosis not present

## 2020-02-20 DIAGNOSIS — R6521 Severe sepsis with septic shock: Secondary | ICD-10-CM | POA: Diagnosis present

## 2020-02-20 DIAGNOSIS — R0602 Shortness of breath: Secondary | ICD-10-CM | POA: Diagnosis not present

## 2020-02-20 DIAGNOSIS — I4891 Unspecified atrial fibrillation: Secondary | ICD-10-CM | POA: Diagnosis not present

## 2020-02-20 DIAGNOSIS — R079 Chest pain, unspecified: Secondary | ICD-10-CM | POA: Diagnosis not present

## 2020-02-20 DIAGNOSIS — J9602 Acute respiratory failure with hypercapnia: Secondary | ICD-10-CM | POA: Diagnosis not present

## 2020-02-20 DIAGNOSIS — I110 Hypertensive heart disease with heart failure: Secondary | ICD-10-CM | POA: Diagnosis not present

## 2020-02-20 LAB — CBC
HCT: 30.4 % — ABNORMAL LOW (ref 36.0–46.0)
Hemoglobin: 8.9 g/dL — ABNORMAL LOW (ref 12.0–15.0)
MCH: 29.4 pg (ref 26.0–34.0)
MCHC: 29.3 g/dL — ABNORMAL LOW (ref 30.0–36.0)
MCV: 100.3 fL — ABNORMAL HIGH (ref 80.0–100.0)
Platelets: 731 10*3/uL — ABNORMAL HIGH (ref 150–400)
RBC: 3.03 MIL/uL — ABNORMAL LOW (ref 3.87–5.11)
RDW: 18.6 % — ABNORMAL HIGH (ref 11.5–15.5)
WBC: 15.4 10*3/uL — ABNORMAL HIGH (ref 4.0–10.5)
nRBC: 0 % (ref 0.0–0.2)

## 2020-02-20 LAB — COMPREHENSIVE METABOLIC PANEL
ALT: 14 U/L (ref 0–44)
AST: 11 U/L — ABNORMAL LOW (ref 15–41)
Albumin: 1.8 g/dL — ABNORMAL LOW (ref 3.5–5.0)
Alkaline Phosphatase: 119 U/L (ref 38–126)
Anion gap: 10 (ref 5–15)
BUN: 49 mg/dL — ABNORMAL HIGH (ref 8–23)
CO2: 28 mmol/L (ref 22–32)
Calcium: 10.6 mg/dL — ABNORMAL HIGH (ref 8.9–10.3)
Chloride: 100 mmol/L (ref 98–111)
Creatinine, Ser: 3.43 mg/dL — ABNORMAL HIGH (ref 0.44–1.00)
GFR, Estimated: 14 mL/min — ABNORMAL LOW (ref >60)
Glucose, Bld: 108 mg/dL — ABNORMAL HIGH (ref 70–99)
Potassium: 5.1 mmol/L (ref 3.5–5.1)
Sodium: 138 mmol/L (ref 135–145)
Total Bilirubin: 0.7 mg/dL (ref 0.3–1.2)
Total Protein: 7.3 g/dL (ref 6.5–8.1)

## 2020-02-20 LAB — LIPASE, BLOOD: Lipase: 32 U/L (ref 11–51)

## 2020-02-20 NOTE — ED Triage Notes (Signed)
Pt to triage via [facility] from home.  L sided abd pain and SOB with exertion since October.  Denies nausea, vomiting, and diarrhea.

## 2020-02-21 ENCOUNTER — Inpatient Hospital Stay (HOSPITAL_COMMUNITY): Payer: Medicare HMO

## 2020-02-21 ENCOUNTER — Inpatient Hospital Stay (HOSPITAL_COMMUNITY)
Admission: RE | Admit: 2020-02-21 | Discharge: 2020-02-21 | Disposition: A | Discharge status: Home / Self Care | Payer: Medicare HMO | Source: Ambulatory Visit

## 2020-02-21 ENCOUNTER — Encounter (HOSPITAL_COMMUNITY): Payer: Self-pay | Admitting: Internal Medicine

## 2020-02-21 DIAGNOSIS — M48061 Spinal stenosis, lumbar region without neurogenic claudication: Secondary | ICD-10-CM | POA: Diagnosis not present

## 2020-02-21 DIAGNOSIS — J9622 Acute and chronic respiratory failure with hypercapnia: Secondary | ICD-10-CM | POA: Diagnosis present

## 2020-02-21 DIAGNOSIS — K651 Peritoneal abscess: Secondary | ICD-10-CM | POA: Diagnosis not present

## 2020-02-21 DIAGNOSIS — E46 Unspecified protein-calorie malnutrition: Secondary | ICD-10-CM | POA: Diagnosis present

## 2020-02-21 DIAGNOSIS — N1831 Chronic kidney disease, stage 3a: Secondary | ICD-10-CM | POA: Diagnosis present

## 2020-02-21 DIAGNOSIS — G9341 Metabolic encephalopathy: Secondary | ICD-10-CM | POA: Diagnosis present

## 2020-02-21 DIAGNOSIS — R7989 Other specified abnormal findings of blood chemistry: Secondary | ICD-10-CM | POA: Diagnosis not present

## 2020-02-21 DIAGNOSIS — R6521 Severe sepsis with septic shock: Secondary | ICD-10-CM | POA: Diagnosis present

## 2020-02-21 DIAGNOSIS — J90 Pleural effusion, not elsewhere classified: Secondary | ICD-10-CM | POA: Diagnosis present

## 2020-02-21 DIAGNOSIS — N200 Calculus of kidney: Secondary | ICD-10-CM | POA: Diagnosis not present

## 2020-02-21 DIAGNOSIS — E872 Acidosis: Secondary | ICD-10-CM | POA: Diagnosis present

## 2020-02-21 DIAGNOSIS — R609 Edema, unspecified: Secondary | ICD-10-CM | POA: Diagnosis not present

## 2020-02-21 DIAGNOSIS — K7581 Nonalcoholic steatohepatitis (NASH): Secondary | ICD-10-CM | POA: Diagnosis present

## 2020-02-21 DIAGNOSIS — E8809 Other disorders of plasma-protein metabolism, not elsewhere classified: Secondary | ICD-10-CM | POA: Diagnosis present

## 2020-02-21 DIAGNOSIS — J9621 Acute and chronic respiratory failure with hypoxia: Secondary | ICD-10-CM | POA: Diagnosis present

## 2020-02-21 DIAGNOSIS — E1122 Type 2 diabetes mellitus with diabetic chronic kidney disease: Secondary | ICD-10-CM | POA: Diagnosis present

## 2020-02-21 DIAGNOSIS — E662 Morbid (severe) obesity with alveolar hypoventilation: Secondary | ICD-10-CM | POA: Diagnosis present

## 2020-02-21 DIAGNOSIS — I509 Heart failure, unspecified: Secondary | ICD-10-CM | POA: Insufficient documentation

## 2020-02-21 DIAGNOSIS — I130 Hypertensive heart and chronic kidney disease with heart failure and stage 1 through stage 4 chronic kidney disease, or unspecified chronic kidney disease: Secondary | ICD-10-CM | POA: Diagnosis present

## 2020-02-21 DIAGNOSIS — D7389 Other diseases of spleen: Secondary | ICD-10-CM | POA: Diagnosis not present

## 2020-02-21 DIAGNOSIS — Z20822 Contact with and (suspected) exposure to covid-19: Secondary | ICD-10-CM | POA: Diagnosis present

## 2020-02-21 DIAGNOSIS — K5730 Diverticulosis of large intestine without perforation or abscess without bleeding: Secondary | ICD-10-CM | POA: Diagnosis not present

## 2020-02-21 DIAGNOSIS — I2510 Atherosclerotic heart disease of native coronary artery without angina pectoris: Secondary | ICD-10-CM | POA: Diagnosis present

## 2020-02-21 DIAGNOSIS — Z515 Encounter for palliative care: Secondary | ICD-10-CM | POA: Diagnosis not present

## 2020-02-21 DIAGNOSIS — N170 Acute kidney failure with tubular necrosis: Secondary | ICD-10-CM | POA: Diagnosis present

## 2020-02-21 DIAGNOSIS — D259 Leiomyoma of uterus, unspecified: Secondary | ICD-10-CM | POA: Diagnosis not present

## 2020-02-21 DIAGNOSIS — N824 Other female intestinal-genital tract fistulae: Secondary | ICD-10-CM | POA: Diagnosis present

## 2020-02-21 DIAGNOSIS — M545 Low back pain, unspecified: Secondary | ICD-10-CM | POA: Diagnosis not present

## 2020-02-21 DIAGNOSIS — D75838 Other thrombocytosis: Secondary | ICD-10-CM | POA: Diagnosis present

## 2020-02-21 DIAGNOSIS — E43 Unspecified severe protein-calorie malnutrition: Secondary | ICD-10-CM | POA: Diagnosis present

## 2020-02-21 DIAGNOSIS — G928 Other toxic encephalopathy: Secondary | ICD-10-CM | POA: Diagnosis present

## 2020-02-21 DIAGNOSIS — A419 Sepsis, unspecified organism: Secondary | ICD-10-CM | POA: Diagnosis present

## 2020-02-21 DIAGNOSIS — Z66 Do not resuscitate: Secondary | ICD-10-CM | POA: Diagnosis present

## 2020-02-21 DIAGNOSIS — I428 Other cardiomyopathies: Secondary | ICD-10-CM | POA: Diagnosis present

## 2020-02-21 DIAGNOSIS — D735 Infarction of spleen: Secondary | ICD-10-CM | POA: Diagnosis present

## 2020-02-21 DIAGNOSIS — J9602 Acute respiratory failure with hypercapnia: Secondary | ICD-10-CM | POA: Diagnosis not present

## 2020-02-21 DIAGNOSIS — I5022 Chronic systolic (congestive) heart failure: Secondary | ICD-10-CM | POA: Diagnosis present

## 2020-02-21 DIAGNOSIS — N828 Other female genital tract fistulae: Secondary | ICD-10-CM | POA: Diagnosis present

## 2020-02-21 DIAGNOSIS — M533 Sacrococcygeal disorders, not elsewhere classified: Secondary | ICD-10-CM | POA: Diagnosis not present

## 2020-02-21 DIAGNOSIS — Z6841 Body Mass Index (BMI) 40.0 and over, adult: Secondary | ICD-10-CM | POA: Diagnosis not present

## 2020-02-21 LAB — I-STAT ARTERIAL BLOOD GAS, ED
Acid-Base Excess: 3 mmol/L — ABNORMAL HIGH (ref 0.0–2.0)
Bicarbonate: 32 mmol/L — ABNORMAL HIGH (ref 20.0–28.0)
Calcium, Ion: 1.53 mmol/L (ref 1.15–1.40)
HCT: 27 % — ABNORMAL LOW (ref 36.0–46.0)
Hemoglobin: 9.2 g/dL — ABNORMAL LOW (ref 12.0–15.0)
O2 Saturation: 95 %
Patient temperature: 98.8
Potassium: 4.9 mmol/L (ref 3.5–5.1)
Sodium: 139 mmol/L (ref 135–145)
TCO2: 34 mmol/L — ABNORMAL HIGH (ref 22–32)
pCO2 arterial: 76 mmHg (ref 32.0–48.0)
pH, Arterial: 7.232 — ABNORMAL LOW (ref 7.350–7.450)
pO2, Arterial: 96 mmHg (ref 83.0–108.0)

## 2020-02-21 LAB — CBC WITH DIFFERENTIAL/PLATELET
Abs Immature Granulocytes: 0.15 10*3/uL — ABNORMAL HIGH (ref 0.00–0.07)
Basophils Absolute: 0 10*3/uL (ref 0.0–0.1)
Basophils Relative: 0 %
Eosinophils Absolute: 0.1 10*3/uL (ref 0.0–0.5)
Eosinophils Relative: 1 %
HCT: 29.2 % — ABNORMAL LOW (ref 36.0–46.0)
Hemoglobin: 8.2 g/dL — ABNORMAL LOW (ref 12.0–15.0)
Immature Granulocytes: 1 %
Lymphocytes Relative: 6 %
Lymphs Abs: 0.8 10*3/uL (ref 0.7–4.0)
MCH: 29.7 pg (ref 26.0–34.0)
MCHC: 28.1 g/dL — ABNORMAL LOW (ref 30.0–36.0)
MCV: 105.8 fL — ABNORMAL HIGH (ref 80.0–100.0)
Monocytes Absolute: 0.7 10*3/uL (ref 0.1–1.0)
Monocytes Relative: 5 %
Neutro Abs: 11.4 10*3/uL — ABNORMAL HIGH (ref 1.7–7.7)
Neutrophils Relative %: 87 %
Platelets: 799 10*3/uL — ABNORMAL HIGH (ref 150–400)
RBC: 2.76 MIL/uL — ABNORMAL LOW (ref 3.87–5.11)
RDW: 19.1 % — ABNORMAL HIGH (ref 11.5–15.5)
WBC: 13.2 10*3/uL — ABNORMAL HIGH (ref 4.0–10.5)
nRBC: 0 % (ref 0.0–0.2)

## 2020-02-21 LAB — D-DIMER, QUANTITATIVE: D-Dimer, Quant: 6.57 ug/mL-FEU — ABNORMAL HIGH (ref 0.00–0.50)

## 2020-02-21 LAB — CK: Total CK: 20 U/L — ABNORMAL LOW (ref 38–234)

## 2020-02-21 LAB — LACTIC ACID, PLASMA
Lactic Acid, Venous: 0.8 mmol/L (ref 0.5–1.9)
Lactic Acid, Venous: 1.2 mmol/L (ref 0.5–1.9)

## 2020-02-21 LAB — GLUCOSE, CAPILLARY: Glucose-Capillary: 83 mg/dL (ref 70–99)

## 2020-02-21 LAB — PROCALCITONIN: Procalcitonin: 0.62 ng/mL

## 2020-02-21 LAB — TROPONIN I (HIGH SENSITIVITY): Troponin I (High Sensitivity): 11 ng/L (ref <18)

## 2020-02-21 LAB — BRAIN NATRIURETIC PEPTIDE: B Natriuretic Peptide: 167.7 pg/mL — ABNORMAL HIGH (ref 0.0–100.0)

## 2020-02-21 LAB — SARS CORONAVIRUS 2 (TAT 6-24 HRS): SARS Coronavirus 2: NEGATIVE

## 2020-02-21 MED ORDER — ACETAMINOPHEN 650 MG RE SUPP: 650.0000 mg | Freq: Four times a day (QID) | RECTAL | Status: DC | PRN

## 2020-02-21 MED ORDER — SODIUM CHLORIDE 0.9 % IV SOLN
2.0000 g | Freq: Once | INTRAVENOUS | Status: AC
  Administered 2020-02-21: 2 g via INTRAVENOUS
  Filled 2020-02-21: qty 2

## 2020-02-21 MED ORDER — ALBUMIN HUMAN 25 % IV SOLN
50.0000 g | Freq: Once | INTRAVENOUS | Status: AC
  Administered 2020-02-21: 50 g via INTRAVENOUS
  Filled 2020-02-21: qty 200

## 2020-02-21 MED ORDER — DOCUSATE SODIUM 100 MG PO CAPS: 100.0000 mg | ORAL_CAPSULE | Freq: Two times a day (BID) | ORAL | Status: DC

## 2020-02-21 MED ORDER — LACTATED RINGERS IV BOLUS (SEPSIS)
800.0000 mL | Freq: Once | INTRAVENOUS | Status: AC
  Administered 2020-02-21: 800 mL via INTRAVENOUS

## 2020-02-21 MED ORDER — VANCOMYCIN HCL 2000 MG/400ML IV SOLN
2000.0000 mg | Freq: Once | INTRAVENOUS | Status: DC
  Filled 2020-02-21 (×2): qty 400

## 2020-02-21 MED ORDER — ONDANSETRON HCL 4 MG/2ML IJ SOLN: 4.0000 mg | Freq: Four times a day (QID) | INTRAMUSCULAR | Status: DC | PRN

## 2020-02-21 MED ORDER — HYDROCODONE-ACETAMINOPHEN 5-325 MG PO TABS: 1.0000 | ORAL_TABLET | Freq: Four times a day (QID) | ORAL | Status: DC | PRN

## 2020-02-21 MED ORDER — BISACODYL 5 MG PO TBEC: 5.0000 mg | DELAYED_RELEASE_TABLET | Freq: Every day | ORAL | Status: DC | PRN

## 2020-02-21 MED ORDER — LACTATED RINGERS IV SOLN: INTRAVENOUS | Status: DC

## 2020-02-21 MED ORDER — POLYETHYLENE GLYCOL 3350 17 G PO PACK: 17.0000 g | PACK | Freq: Every day | ORAL | Status: DC | PRN

## 2020-02-21 MED ORDER — CARVEDILOL 12.5 MG PO TABS: 25.0000 mg | ORAL_TABLET | Freq: Two times a day (BID) | ORAL | Status: DC

## 2020-02-21 MED ORDER — SODIUM CHLORIDE 0.9 % IV SOLN
2.0000 g | INTRAVENOUS | Status: DC
  Administered 2020-02-22: 2 g via INTRAVENOUS
  Filled 2020-02-21: qty 2

## 2020-02-21 MED ORDER — ALBUTEROL SULFATE (2.5 MG/3ML) 0.083% IN NEBU: 2.5000 mg | INHALATION_SOLUTION | RESPIRATORY_TRACT | Status: DC | PRN

## 2020-02-21 MED ORDER — VANCOMYCIN VARIABLE DOSE PER UNSTABLE RENAL FUNCTION (PHARMACIST DOSING): Status: DC

## 2020-02-21 MED ORDER — METRONIDAZOLE IN NACL 5-0.79 MG/ML-% IV SOLN
500.0000 mg | Freq: Three times a day (TID) | INTRAVENOUS | Status: DC
  Administered 2020-02-22 – 2020-02-23 (×5): 500 mg via INTRAVENOUS
  Filled 2020-02-21 (×5): qty 100

## 2020-02-21 MED ORDER — SODIUM CHLORIDE 0.9% FLUSH
3.0000 mL | Freq: Two times a day (BID) | INTRAVENOUS | Status: DC
  Administered 2020-02-21 – 2020-02-23 (×4): 3 mL via INTRAVENOUS

## 2020-02-21 MED ORDER — ACETAMINOPHEN 325 MG PO TABS: 650.0000 mg | ORAL_TABLET | Freq: Four times a day (QID) | ORAL | Status: DC | PRN

## 2020-02-21 MED ORDER — ENOXAPARIN SODIUM 30 MG/0.3ML ~~LOC~~ SOLN
30.0000 mg | SUBCUTANEOUS | Status: DC
  Administered 2020-02-22 – 2020-02-23 (×2): 30 mg via SUBCUTANEOUS
  Filled 2020-02-21 (×2): qty 0.3

## 2020-02-21 MED ORDER — LACTATED RINGERS IV BOLUS
1000.0000 mL | Freq: Once | INTRAVENOUS | Status: AC
  Administered 2020-02-21: 1000 mL via INTRAVENOUS

## 2020-02-21 MED ORDER — ONDANSETRON HCL 4 MG PO TABS: 4.0000 mg | ORAL_TABLET | Freq: Four times a day (QID) | ORAL | Status: DC | PRN

## 2020-02-21 MED ORDER — MORPHINE SULFATE (PF) 2 MG/ML IV SOLN
2.0000 mg | INTRAVENOUS | Status: DC | PRN
  Administered 2020-02-23 (×2): 2 mg via INTRAVENOUS
  Filled 2020-02-21 (×3): qty 1

## 2020-02-21 MED ORDER — HYDRALAZINE HCL 20 MG/ML IJ SOLN: 5.0000 mg | INTRAMUSCULAR | Status: DC | PRN

## 2020-02-21 NOTE — Progress Notes (Addendum)
Pharmacy Antibiotic Note  Phyllis Todd is a 70 y.o. female admitted on 02/19/2020 with sepsis suspected due to intra-abdominal infection. Pharmacy has been consulted for vancomycin and cefepime dosing.  Scr significantly elevated today at 3.42, baseline ~1.8-1.9. Chronic leukocytosis, most recent WBC 15.4 on 1/9, repeat CBC results pending. Afebrile.  Plan: Vancomycin 2 g IV now. Due to poor renal function, pharmacy will order intermittent vancomycin doses as necessary.  Cefepime 2 g IV now then 2 g q24h Monitor renal function, culture results, labs, clinical improvement  Height: 5\' 4"  (162.6 cm) Weight: 136.1 kg (300 lb) IBW/kg (Calculated) : 54.7  Temp (24hrs), Avg:98 F (36.7 C), Min:97.6 F (36.4 C), Max:98.1 F (36.7 C)  Recent Labs  Lab 03/07/2020 1324  WBC 15.4*  CREATININE 3.43*    Estimated Creatinine Clearance: 21.9 mL/min (A) (by C-G formula based on SCr of 3.43 mg/dL (H)).    Allergies  Allergen Reactions  . Codeine Other (See Comments)    "makes me hyper"  . Demerol [Meperidine] Other (See Comments)    "sick"  . Lisinopril Cough  . Peanut-Containing Drug Products Hives and Swelling    "almonds"    Antimicrobials this admission: Cefepime 1/10 >>  Vanc 1/10 >>  Flagyl 1/10 >>  Dose adjustments this admission:  Microbiology results: 1/10 BCx: pending  Thank you for allowing pharmacy to be a part of this patient's care.  Fara Olden, PharmD PGY-1 Pharmacy Resident 02/21/2020 5:20 PM Please see AMION for all pharmacy numbers

## 2020-02-21 NOTE — Progress Notes (Signed)
Pt transported from [place] room 20 to 5[facility] room 6 without any complications.

## 2020-02-21 NOTE — Consult Note (Signed)
NAME:  Phyllis Todd, MRN:  [id number], DOB:  03/14/1950, LOS: 0 ADMISSION DATE:  02/19/2020, CONSULTATION DATE:  02/21/20  REFERRING MD:  Lorin Mercy, CHIEF COMPLAINT:  Shortness of breath, abdominal pain  Brief History   24 yof initially admitted [facility] for sepsis and volume overload. Imaging revealed a splenic fluid collection, uterine fistula, osseous changes involving the lumbosacrum, and a large left pleural effusion.  Throughout the day, she became hypotensive and mental/ respiratory status declined required BiPAP. PCCM consulted.  History of present illness   23 yof with the medical hx listed below who presented to [facility] 03/05/2020 for weakness, LUQ abdominal pain, dyspnea, and peripheral edema.  Due to pt's deterioration in mental status since arrival to the ED, hx has been obtained from chart review.  She has had persistent abdominal pain since her prior hospitalization for an intraabdominal abscess 3 months ago. She had noted progressive weakness 2d prior to admission which she felt was similar to how she felt prior to last admission. She also notes lower extremity swelling and shortness of breath. Denies fever, chills, vomiting. Does endorse some nausea and a non-productive cough.   She was admitted 10/25-11/5/21 for sepsis secondary to an intra-abdominal abscess resulting in drain placement and was discharged with cefdinir and Augmentin.  ED course:  Labs on admission showed WBC 15.4, hgb 8.9, plat 731, creatinine 3.43, BNP 167, D-dimer 6.5. CXR showed a large left pleural effusion. Initial abdominal imaging revealed a new large mildly complex fluid collection in the spleen and radiolucent lesions involving the lumbosacral. She was subsequently admitted to [facility].  Unfortunately, her mental status and respiratory status declined while still in the ED and ABG was consistent with a respiratory acidosis requiring BiPAP. Her blood pressures additionally declined.   Past Medical History  OSA,  hypertension, morbid obesity, abdominal abscess  Significant Hospital Events   02/21/19 admission  Consults:  Surgery PCCM  Procedures:  n/a  Significant Diagnostic Tests:  03/09/2020 CXR>>moderate left pleural effusion  1/10 CT abdomen/pelvis>>large heterogenous fluid collection in the spleen 10.4x15cm with surrounding inflammatory stranding, trace ascites, expansile lucent lesions seen in the L3-4 facet joints and flanking the anterior SI joints bilaterally.   1/10 L-spine MRI>>extensive erosive changes and bony expansion centered around the L3-4 facet joints bilaterally left >R and the bilateral SI joints. Expansile nature of L3-4 facet impresses on the posterolateral thecal sac and neural foramen resulting in severe spinal and left foraminal stenosis. No evidence of osteomyelitis or discitis.  1/10 pelvic MRI>>air present in the vaginal and endometrial canal. Large air collection just above the uterus in the area of prior abscess. Questionable fistula?  Micro Data:  1/10 blood cultures>> 1/10 urine culture>>  Antimicrobials:  Cefepime 1/10>> Flagyl 1/10>> Vancomycin 1/10>>  Objective   Blood pressure (!) 100/54, pulse 61, temperature 98.1 F (36.7 C), temperature source Oral, resp. rate (!) 23, height 5\' 4"  (1.626 m), weight 136.1 kg, SpO2 97 %.    FiO2 (%):  [40 %] 40 %   Intake/Output Summary (Last 24 hours) at 02/21/2020 1757 Last data filed at 02/21/2020 1108 Gross per 24 hour  Intake 1000 ml  Output --  Net 1000 ml   Filed Weights   02/16/2020 1256  Weight: 136.1 kg    Examination: General: acutely ill appearing HENT: eyes anti-icteric, head atraumatic Lungs: on BiPAP. Regular rate. Diminished lung sounds on the left Cardiovascular: RRR. Bilateral +3 pitting lower extremity edema extending above the knees. Extremities warm Abdomen: soft. Tender to  palpation.  Extremities: moves all extremities.  Neuro: awakens to voice. Follows commands intermittently. No focal  deficits appreciated Skin: no rash on limited exam    Assessment & Plan:   Septic shock. Suspect source is the splenic fluid collection however can not rule out recurrent intra-abdominal abscess at the prior location vs pna. Hemodynamically stable at this time however pressures have been trending down since admission earlier today despite volume resusitation End organ damage evident by acute renal failure, encephalopathy, acute respiratory failure Splenic fluid collection vs abscess as noted with surrounding inflammatory changes on CT Hx of intra-abdominal infection Plan -sepsis protocol -continue broad spectrum abx with vancomycin, flagyl and cefepime -narrow abx based on blood/urine culture results -follow up surgery recommendations--consulted by [facility] per Dr. Lorin Mercy who spoke with Dr. Zenia Resides -maintain MAP >86mmHg  -follow lactate  Acute hypercapnic hypoxic respiratory failure with primary respiratory acidosis requiring BiPAP support Left pleural effusion. Thoracentesis ordered by admitting provider. Problem list includes systolic heart failure however last echo 11/2019 showed normal EF and diastolic function. Would consider unilateral nature to be unlikely attributable to a cardiogenic source. Hx of OSA Plan -defer thoracentesis until off BiPAP  -continue BiPAP -repeat ABG in AM  -consider echocardiogram in AM  Acute renal failure on CKD 3a. Suspect related to volume depletion/sepsis Plan -replete volume -repeat BMP in AM -strict I/O -avoid nephrotoxic agents.  Acute metabolic toxic encephalopathy. Suspect this will improve with resolution of sepsis and metabolic correction. Delirium precautions  D-dimer elevation. No VTE appreciated on bilateral LE duplex. Suspect elevation in D-dimer is attributable to sepsis as an acute phase reactant.  Osseous lesions involving L3-4 and bilateral SI joints. She does have a borderline gamma gap elevation that could raise the concern for  amyloidosis or other plasma dyscrasia. It is also possible to be from chronic infection vs NASH. Will need further outpatient workup for ongoing evaluation.  Aortic Atherosclerosis Hx of hypertension. Hold antihypertensives in setting of sepsis   Best practice:  Diet: NPO Pain/Anxiety/Delirium protocol (if indicated): n/a VAP protocol (if indicated): n/a DVT prophylaxis: heparin GI prophylaxis: n/a Glucose control: n/a Code Status: DNR Family Communication: complete Disposition: ICU  Labs   CBC: Recent Labs  Lab 02/15/2020 1324 02/21/20 1648  WBC 15.4*  --   HGB 8.9* 9.2*  HCT 30.4* 27.0*  MCV 100.3*  --   PLT 731*  --     Basic Metabolic Panel: Recent Labs  Lab 03/11/2020 1324 02/21/20 1648  NA 138 139  K 5.1 4.9  CL 100  --   CO2 28  --   GLUCOSE 108*  --   BUN 49*  --   CREATININE 3.43*  --   CALCIUM 10.6*  --    GFR: Estimated Creatinine Clearance: 21.9 mL/min (A) (by C-G formula based on SCr of 3.43 mg/dL (H)). Recent Labs  Lab 02/17/2020 1324  WBC 15.4*    Liver Function Tests: Recent Labs  Lab 02/15/2020 1324  AST 11*  ALT 14  ALKPHOS 119  BILITOT 0.7  PROT 7.3  ALBUMIN 1.8*   Recent Labs  Lab 03/10/2020 1324  LIPASE 32   No results for input(s): AMMONIA in the last 168 hours.  ABG    Component Value Date/Time   PHART 7.232 (L) 02/21/2020 1648   PCO2ART 76.0 (HH) 02/21/2020 1648   PO2ART 96 02/21/2020 1648   HCO3 32.0 (H) 02/21/2020 1648   TCO2 34 (H) 02/21/2020 1648   O2SAT 95.0 02/21/2020 1648  Coagulation Profile: No results for input(s): INR, PROTIME in the last 168 hours.  Cardiac Enzymes: Recent Labs  Lab 02/21/20 0356  CKTOTAL 20*    HbA1C: Hgb A1c MFr Bld  Date/Time Value Ref Range Status  12/08/2019 08:34 AM 5.5 4.8 - 5.6 % Final    Comment:    (NOTE)         Prediabetes: 5.7 - 6.4         Diabetes: >6.4         Glycemic control for adults with diabetes: <7.0     CBG: No results for input(s): GLUCAP in  the last 168 hours.  Review of Systems:   Unable to be performed due to mental status  Past Medical History  She,  has a past medical history of Acute combined systolic and diastolic heart failure ([place]) (06/2013), Aortic atherosclerosis ([place]), Diverticulitis, Diverticulosis, Enteritis, Hypertension, Morbid obesity ([place]), NASH (nonalcoholic steatohepatitis), Nephrolithiasis, NICM (nonischemic cardiomyopathy) ([place]) (06/2013), Obstructive sleep apnea (adult) (pediatric) (06/2013), and Septic shock ([place]).   Surgical History    Past Surgical History:  Procedure Laterality Date  . CARDIAC CATHETERIZATION  2015  . CESAREAN SECTION    . DILATION AND CURETTAGE OF UTERUS  1984  . IR RADIOLOGIST EVAL & MGMT  01/04/2020  . LEFT HEART CATHETERIZATION WITH CORONARY ANGIOGRAM N/A 07/12/2013   Procedure: LEFT HEART CATHETERIZATION WITH CORONARY ANGIOGRAM;  Surgeon: Troy Sine, MD;  Location: [facility] CATH LAB;  Service: Cardiovascular;  Laterality: N/A;  . TONSILLECTOMY       Social History   reports that she has never smoked. She has never used smokeless tobacco. She reports current alcohol use. She reports that she does not use drugs.   Family History   Her family history includes Breast cancer in her maternal grandmother; CAD in her father; Diabetes in her brother; Heart failure in her mother; Hypertension in her brother, father, and mother; Stroke in her mother; Stroke (age of onset: 64) in her brother.   Allergies Allergies  Allergen Reactions  . Codeine Other (See Comments)    "makes me hyper"  . Demerol [Meperidine] Other (See Comments)    "sick"  . Lisinopril Cough  . Peanut-Containing Drug Products Hives and Swelling    "almonds"     Home Medications  Prior to Admission medications   Medication Sig Start Date End Date Taking? Authorizing Provider  acetaminophen (TYLENOL) 500 MG tablet Take 500 mg by mouth every 6 (six) hours as needed (pain).   Yes [provider]  carvedilol  (COREG) 25 MG tablet TAKE (1) TABLET TWICE A DAY WITH FOOD---BREAKFAST AND SUPPER. Patient taking differently: Take 25 mg by mouth 2 (two) times daily with a meal. 02/24/19  Yes Larey Dresser, MD  furosemide (LASIX) 20 MG tablet Take 1 tablet (20 mg total) by mouth daily. Patient taking differently: Take 20-40 mg by mouth See admin instructions. Alternates 20mg  and 40mg  daily 12/17/19  Yes Nita Sells, MD  HYDROcodone-acetaminophen (NORCO/VICODIN) 5-325 MG tablet Take 1-2 tablets by mouth every 6 (six) hours as needed for moderate pain or severe pain. 12/17/19  Yes Nita Sells, MD  naproxen sodium (ALEVE) 220 MG tablet Take 220 mg by mouth daily as needed (pain).   Yes [provider]     Mitzi Hansen, MD Internal Medicine Resident PGY-2 [facility] Pager: [phone] 02/21/2020 5:57 PM

## 2020-02-21 NOTE — Progress Notes (Signed)
eLink Physician-Brief Progress Note Patient Name: [name] DOB: Oct 17, 1950 MRN: [id number]   Date of Service  02/21/2020  HPI/Events of Note  Patient seen in consultation by PCCM who recommended disposition as ICU. Patient subsequently sent to 3E20.  eICU Interventions  Will transfer patient to ICU bed.      Intervention Category Major Interventions: Other:  Lysle Dingwall 02/21/2020, 9:34 PM

## 2020-02-21 NOTE — ED Notes (Signed)
Unable to collect/capture urine in hallway. Pt incontinent and do not have resource to place pur wik

## 2020-02-21 NOTE — ED Notes (Signed)
Placed pt in [place] to help improve blood pressure

## 2020-02-21 NOTE — Progress Notes (Signed)
VASCULAR LAB    Bilateral lower extremity venous duplex has been performed.  See CV proc for preliminary results.   Santiana Glidden, RVT 02/21/2020, 9:17 AM

## 2020-02-21 NOTE — Progress Notes (Signed)
Notified provider of need to order fluid bolus, pt needs 4083 cc fluid, or are we using ideal body weight.

## 2020-02-21 NOTE — ED Provider Notes (Addendum)
[facility] EMERGENCY DEPARTMENT Provider Note   CSN: [id number] Arrival date & time: 02/21/2020  1252   History Chief Complaint  Patient presents with  . Abdominal Pain  . Shortness of Breath    Phyllis Todd is a 70 y.o. female.  The history is provided by the patient.  Abdominal Pain Associated symptoms: shortness of breath   Shortness of Breath Associated symptoms: abdominal pain   She has history of hypertension, combined systolic and diastolic heart failure and was hospitalized 3 months ago for an intra-abdominal abscess.  Since discharge, she has had a pain in the left upper abdomen which waxes and wanes but has never gone away.  This pain is worse with movement and worse with taking a deep breath.  Pain has been rated at 8/10 at its worst.  2 days ago, she noted that she got very weak and was unable to stand and unable to get into her Lucianne Lei.  This was similar to the way she felt prior to being admitted for her intra-abdominal abscess.  She has noted dyspnea and has noted leg swelling.  She denies fever, chills, sweats.  There has been a nonproductive cough.  There has been nausea but no vomiting.  She does complain of chronic constipation.  She denies any urinary difficulty.  She denies arthralgias or myalgias.  She has been compliant with her medications and there has been no medication change.  She denies sick contacts, also denies vaccination against COVID-19.  Past Medical History:  Diagnosis Date  . Acute combined systolic and diastolic heart failure ([place]) 06/2013   Class 3 - ECHO EF 35-40%  . Acute renal failure ([place])   . Aortic atherosclerosis ([place])   . BMI 60.0-69.9, adult ([place])   . CHF (congestive heart failure) ([place]) 06/2013  . Diverticulitis   . Diverticulosis   . Enteritis   . Hypertension   . Morbid obesity ([place])   . NASH (nonalcoholic steatohepatitis)   . Nephrolithiasis   . NICM (nonischemic cardiomyopathy) ([place]) 06/2013  . Obstructive sleep apnea  (adult) (pediatric) 06/2013  . Other ascites 06/2013  . Other chronic nonalcoholic liver disease 123456  . Septic shock [facility])     Patient Active Problem List   Diagnosis Date Noted  . Severe sepsis with septic shock ([place]) 12/12/2019  . AKI (acute kidney injury) ([place]) 12/12/2019  . Intra-abdominal abscess ([place]) 12/07/2019  . Morbid obesity with BMI of 50.0-59.9, adult ([place]) 05/03/2016  . Pulmonary HTN ([place]) 01/04/2014  . RVF (right ventricular failure) ([place]) 01/04/2014  . Chronic systolic CHF (congestive heart failure) ([place]) 09/01/2013  . OSA (obstructive sleep apnea) 07/21/2013  . Non-ischemic cardiomyopathy ([place]) 07/13/2013  . Hypertensive heart disease 07/10/2013  . Essential hypertension 07/07/2013    Past Surgical History:  Procedure Laterality Date  . CARDIAC CATHETERIZATION  2015  . CESAREAN SECTION    . DILATION AND CURETTAGE OF UTERUS  1984  . IR RADIOLOGIST EVAL & MGMT  01/04/2020  . LEFT HEART CATHETERIZATION WITH CORONARY ANGIOGRAM N/A 07/12/2013   Procedure: LEFT HEART CATHETERIZATION WITH CORONARY ANGIOGRAM;  Surgeon: Troy Sine, MD;  Location: [facility] CATH LAB;  Service: Cardiovascular;  Laterality: N/A;  . TONSILLECTOMY       OB History   No obstetric history on file.     Family History  Problem Relation Age of Onset  . Hypertension Mother   . Heart failure Mother   . Stroke Mother   . CAD Father   .  Hypertension Father   . Stroke Brother 38  . Diabetes Brother   . Hypertension Brother   . Breast cancer Maternal Grandmother     Social History   Tobacco Use  . Smoking status: Never Smoker  . Smokeless tobacco: Never Used  Substance Use Topics  . Alcohol use: Yes    Comment: occas  . Drug use: No    Home Medications Prior to Admission medications   Medication Sig Start Date End Date Taking? Authorizing Provider  losartan (COZAAR) 50 MG tablet Take 50 mg by mouth 2 times daily at 12 noon and 4 pm.   Yes [provider]  acetaminophen  (TYLENOL) 500 MG tablet Take 500 mg by mouth every 6 (six) hours as needed (pain).    [provider]  carvedilol (COREG) 25 MG tablet TAKE (1) TABLET TWICE A DAY WITH FOOD---BREAKFAST AND SUPPER. Patient taking differently: Take 25 mg by mouth 2 (two) times daily with a meal.  02/24/19   Larey Dresser, MD  furosemide (LASIX) 20 MG tablet Take 1 tablet (20 mg total) by mouth daily. 12/17/19   Nita Sells, MD  HYDROcodone-acetaminophen (NORCO/VICODIN) 5-325 MG tablet Take 1-2 tablets by mouth every 6 (six) hours as needed for moderate pain or severe pain. 12/17/19   Nita Sells, MD    Allergies    Codeine, Demerol [meperidine], Lisinopril, and Peanut-containing drug products  Review of Systems   Review of Systems  Respiratory: Positive for shortness of breath.   Gastrointestinal: Positive for abdominal pain.  All other systems reviewed and are negative.   Physical Exam Updated Vital Signs BP (!) 112/49 (BP Location: Left Arm)   Pulse 61   Temp 97.6 F (36.4 C) (Oral)   Resp 18   Ht 5\' 4"  (1.626 m)   Wt 136.1 kg   SpO2 99%   BMI 51.49 kg/m   Physical Exam Vitals and nursing note reviewed.   Morbidly obese 70year old female, mildly dyspneic at rest.  She is not using accessory muscles of respiration, but can only speak about 4 or 5 words at a time before stopping to take another breath. Vital signs are normal. Oxygen saturation is 99%, which is normal. Head is normocephalic and atraumatic. PERRLA, EOMI. Oropharynx is clear. Neck is nontender and supple without adenopathy or JVD. Back is nontender and there is no CVA tenderness. Lungs are clear without rales, wheezes, or rhonchi. Chest is nontender. Heart has regular rate and rhythm without murmur. Abdomen is soft, flat, with mild left upper quadrant tenderness.  There is no rebound or guarding.  There are no masses or hepatosplenomegaly and peristalsis is normoactive. Extremities have 3-4+ edema, full  range of motion is present. Skin is warm and dry without rash. Neurologic: Mental status is normal, cranial nerves are intact.  Grip strength is 5/5 bilaterally, but elbow extension and flexion are 4/5, hip flexion is 3-4/5.  Weakness is symmetric.  ED Results / Procedures / Treatments   Labs (all labs ordered are listed, but only abnormal results are displayed) Labs Reviewed  COMPREHENSIVE METABOLIC PANEL - Abnormal; Notable for the following components:      Result Value   Glucose, Bld 108 (*)    BUN 49 (*)    Creatinine, Ser 3.43 (*)    Calcium 10.6 (*)    Albumin 1.8 (*)    AST 11 (*)    GFR, Estimated 14 (*)    All other components within normal limits  CBC -  Abnormal; Notable for the following components:   WBC 15.4 (*)    RBC 3.03 (*)    Hemoglobin 8.9 (*)    HCT 30.4 (*)    MCV 100.3 (*)    MCHC 29.3 (*)    RDW 18.6 (*)    Platelets 731 (*)    All other components within normal limits  BRAIN NATRIURETIC PEPTIDE - Abnormal; Notable for the following components:   B Natriuretic Peptide 167.7 (*)    All other components within normal limits  D-DIMER, QUANTITATIVE (NOT AT [facility]) - Abnormal; Notable for the following components:   D-Dimer, Quant 6.57 (*)    All other components within normal limits  CK - Abnormal; Notable for the following components:   Total CK 20 (*)    All other components within normal limits  SARS CORONAVIRUS 2 (TAT 6-24 HRS)  LIPASE, BLOOD  URINALYSIS, ROUTINE W REFLEX MICROSCOPIC  TROPONIN I (HIGH SENSITIVITY)    EKG EKG Interpretation  Date/Time:  02/21/2020 13:06:35 EST Ventricular Rate:  58 PR Interval:  178 QRS Duration: 146 QT Interval:  414 QTC Calculation: 406 R Axis:   67 Text Interpretation: Sinus bradycardia Right bundle branch block Cannot rule out Anterior infarct , age undetermined Abnormal ECG When compared with ECG of 11/24/2017, Premature ventricular complexes are no longer present Confirmed by Delora Fuel  ([id number]) on February 21, 2020 11:22:28 PM   Radiology DG Chest 2 View  Result Date: 02/21/20 CLINICAL DATA:  Left-sided chest pain and cough for several weeks. Shortness of breath. EXAM: CHEST - 2 VIEW COMPARISON:  12/07/2019 FINDINGS: Moderate left pleural effusion is new since previous study, with associated compressive atelectasis. Right lung remains clear. No evidence of pneumothorax. Heart size remains stable. IMPRESSION: New moderate left pleural effusion and compressive atelectasis. Electronically Signed   By: Marlaine Hind M.D.   On: 2020-02-21 14:03    Procedures Procedures  CRITICAL CARE Performed by: Delora Fuel Total critical care time: 35 minutes Critical care time was exclusive of separately billable procedures and treating other patients. Critical care was necessary to treat or prevent imminent or life-threatening deterioration. Critical care was time spent personally by me on the following activities: development of treatment plan with patient and/or surrogate as well as nursing, discussions with consultants, evaluation of patient's response to treatment, examination of patient, obtaining history from patient or surrogate, ordering and performing treatments and interventions, ordering and review of laboratory studies, ordering and review of radiographic studies, pulse oximetry and re-evaluation of patient's condition.  Medications Ordered in ED Medications  albumin human 25 % solution 50 g (has no administration in time range)  lactated ringers bolus 1,000 mL (1,000 mLs Intravenous New Bag/Given 02/21/20 0553)    ED Course  I have reviewed the triage vital signs and the nursing notes.  Pertinent labs & imaging results that were available during my care of the patient were reviewed by me and considered in my medical decision making (see chart for details).  MDM Rules/Calculators/A&P Weakness, left upper quadrant pain, dyspnea, peripheral edema.  Many of her symptoms may be related  to heart failure.  However, source of weakness is unclear.  Labs were obtained at triage and are significant for acute kidney injury with creatinine 3.43 compared with baseline of 1.83.  Mild hypercalcemia present, probably not clinically significant.  She is noted to be markedly hypoalbuminemic which is certainly worsening her peripheral edema.  Anemia is present not significantly changed from baseline.  She has a  chronic leukocytosis which is unchanged from baseline.  Thrombocytosis is noted, with platelet count significantly increased compared with baseline.  Will check troponin and BNP and also check CK to look for evidence of myositis.  She will need to be admitted for management of her worsening edema and acute kidney injury.    Troponin is normal and BNP is only mildly elevated.  CK is normal.  D-dimer is significantly elevated, but unable to do CT angiogram because of kidney status.  While waiting for test to come back, she was noted to get somewhat hypotensive.  In spite of peripheral edema, she is likely intravascularly contracted which is significant part of her renal failure.  She is given IV fluids to support blood pressure.  Venous Dopplers have been ordered.  Case is discussed with Dr. Hal Hope of [facility], who agrees to admit the patient.  Final Clinical Impression(s) / ED Diagnoses Final diagnoses:  Acute kidney injury (nontraumatic) (HCC)  Acute on chronic combined systolic and diastolic heart failure (HCC)  LUQ abdominal pain  Macrocytic anemia  Weakness  Thrombocytosis  Hypercalcemia  Hypoalbuminemia  Elevated d-dimer    Rx / DC Orders ED Discharge Orders    None       Delora Fuel, MD 35/45/62 [id number]    Delora Fuel, MD 93/73/42 [phone]

## 2020-02-21 NOTE — ED Notes (Signed)
After returning from CT. Pt was evaluated on room air. Pt SpO2 dropped 74%. Pt did not appear to be in any distress. Placed back on 2 liters and SpO2 returned to 97%

## 2020-02-21 NOTE — H&P (Signed)
History and Physical    Phyllis Todd WNU:[id number] DOB: November 27, 1950 DOA: 02/19/2020  PCP: Joyice Faster, FNP Consultants:  Aundra Dubin - cardiology; Gross - surgery Patient coming from:  Home - lives with her father; NOK: [place], [place], [phone]  Chief Complaint: abdominal pain  HPI: Phyllis Todd is a 70 y.o. female with medical history significant of NASH; morbid obesity; OSA; HTN; h/o chronic combined CHF (with normal echo in 11/2019); and stage 3a CKD presenting with abdominal pain.  She was last admitted from 10/25-11/5 with septic shock due to intraabdominal abscess; cultures grew Bacteroides and E coli.  She reports that she presented due to inability to breathe starting 3 days ago.  +cough, +fatigue.  No sick contacts.  + fever, unsure how high.  Mild LE edema.  No orthopnea.   ED Course:  Carryover, per Dr. Hal Hope:  70 year old female with history of CHF chronic kidney disease admitted about 2 months ago for septic shock from intra-abdominal abscess presents with shortness of breath and abdominal discomfort. ER physician states that patient is fluid overloaded but blood pressure was in the low normal actually ER physician gave fluid bolus. Presently ordering albumin infusion. Blood pressures in the low normal. CT abdomen pelvis also has been ordered. Patient has worsening renal function. Has multiple issues going on. But per ER physician not in distress. Covid test is pending.   Review of Systems: As per HPI; otherwise review of systems reviewed and negative.  Sparse history so may be incomplete.  Ambulatory Status:  Ambulates with walker  COVID Vaccine Status:  None  Past Medical History:  Diagnosis Date  . Acute combined systolic and diastolic heart failure ([place]) 06/2013   Class 3 - ECHO EF 35-40%  . Aortic atherosclerosis ([place])   . Diverticulitis   . Diverticulosis   . Enteritis   . Hypertension   . Morbid obesity ([place])   . NASH (nonalcoholic  steatohepatitis)   . Nephrolithiasis   . NICM (nonischemic cardiomyopathy) ([place]) 06/2013  . Obstructive sleep apnea (adult) (pediatric) 06/2013  . Septic shock [facility])     Past Surgical History:  Procedure Laterality Date  . CARDIAC CATHETERIZATION  2015  . CESAREAN SECTION    . DILATION AND CURETTAGE OF UTERUS  1984  . IR RADIOLOGIST EVAL & MGMT  01/04/2020  . LEFT HEART CATHETERIZATION WITH CORONARY ANGIOGRAM N/A 07/12/2013   Procedure: LEFT HEART CATHETERIZATION WITH CORONARY ANGIOGRAM;  Surgeon: Troy Sine, MD;  Location: [facility] CATH LAB;  Service: Cardiovascular;  Laterality: N/A;  . TONSILLECTOMY      Social History   Socioeconomic History  . Marital status: Divorced    Spouse name: Not on file  . Number of children: 1  . Years of education: 32  . Highest education level: Not on file  Occupational History  . 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 Smoking status: Never Smoker  . Smokeless tobacco: Never Used  Substance and Sexual Activity  . Alcohol use: Yes    Comment: occas  . Drug use: No  . Sexual activity: Not on file  Other Topics Concern  . Not on file  Social History Narrative   Dariela grew up in [place], [place]. She attended [employer] and obtained her Bachelor's in Literature. She is divorced and has 1 daughter Marliss Czar). Hobbies: crochet and gardening.   Social Determinants of Health   Financial Resource Strain: Not on file  Food Insecurity: Not on file  Transportation Needs: Not on file  Physical Activity: Not on file  Stress: Not on file  Social Connections: Not on file  Intimate Partner Violence: Not on file    Allergies  Allergen Reactions  . Codeine Other (See Comments)    "makes me hyper"  . Demerol [Meperidine] Other (See Comments)    "sick"  . Lisinopril Cough  . Peanut-Containing Drug Products Hives and Swelling    "almonds"    Family History  Problem Relation Age of Onset  . Hypertension Mother   .  Heart failure Mother   . Stroke Mother   . CAD Father   . Hypertension Father   . Stroke Brother 75  . Diabetes Brother   . Hypertension Brother   . Breast cancer Maternal Grandmother     Prior to Admission medications   Medication Sig Start Date End Date Taking? Authorizing Provider  acetaminophen (TYLENOL) 500 MG tablet Take 500 mg by mouth every 6 (six) hours as needed (pain).   Yes [provider]  carvedilol (COREG) 25 MG tablet TAKE (1) TABLET TWICE A DAY WITH FOOD---BREAKFAST AND SUPPER. Patient taking differently: Take 25 mg by mouth 2 (two) times daily with a meal. 02/24/19  Yes Larey Dresser, MD  furosemide (LASIX) 20 MG tablet Take 1 tablet (20 mg total) by mouth daily. Patient taking differently: Take 20-40 mg by mouth See admin instructions. Alternates 20mg  and 40mg  daily 12/17/19  Yes Nita Sells, MD  HYDROcodone-acetaminophen (NORCO/VICODIN) 5-325 MG tablet Take 1-2 tablets by mouth every 6 (six) hours as needed for moderate pain or severe pain. 12/17/19  Yes Nita Sells, MD  naproxen sodium (ALEVE) 220 MG tablet Take 220 mg by mouth daily as needed (pain).   Yes [provider]    Physical Exam: Vitals:   02/21/20 1110 02/21/20 1538 02/21/20 1542 02/21/20 1542  BP: 114/64 (!) 88/35 (!) 92/44 (!) 92/44  Pulse: 62 60 61 61  Resp: 16 18 18    Temp: 98.1 F (36.7 C)  98.1 F (36.7 C)   TempSrc: Oral  Oral   SpO2: 96%  93% 93%  Weight:      Height:         . General:  Appears calm and comfortable and is in NAD, poor historian - through the afternoon, she has appeared more lethargic and less conversant (but not overly conversant even at time of exam) . Eyes:  EOMI, normal lids, iris . ENT:  grossly normal hearing, lips & tongue, mildly dry mm; difficulty wearing mask . Neck:  no LAD, masses or thyromegaly . Cardiovascular:  RRR, no m/r/g. No LE edema.  Marland Kitchen Respiratory:   CTA bilaterally with no wheezes/rales/rhonchi.  Normal  respiratory effort. . Abdomen:  soft, NT, ND . Skin:  no rash or induration seen on limited exam . Musculoskeletal:  Mildly decreased tone BUE/BLE, no bony abnormality . Lower extremity:  No LE edema.  Limited foot exam with no ulcerations.  2+ distal pulses. Marland Kitchen Psychiatric:  flat mood and affect, speech fluent and appropriate, AOx3 . Neurologic:  CN 2-12 grossly intact, moves all extremities in coordinated fashion    Radiological Exams on Admission: Independently reviewed - see discussion in A/P where applicable  CT ABDOMEN PELVIS WO CONTRAST  Result Date: 02/21/2020 CLINICAL DATA:  Abdominal abscess/infection suspected. Diffuse abdominal pain going on for a couple of days. EXAM: CT ABDOMEN AND PELVIS WITHOUT CONTRAST TECHNIQUE: Multidetector CT imaging of the abdomen and pelvis was performed following the  standard protocol without IV contrast. COMPARISON:  01/04/2020. FINDINGS: Lower chest: Collapse/consolidation in the lingula and left lower lobe with a moderate left pleural effusion. Heart is enlarged. No pericardial effusion. Hepatobiliary: Liver and gallbladder are unremarkable. No biliary ductal dilatation. Pancreas: Negative. Spleen: There is a large heterogeneous fluid collection in the spleen, measuring approximately 10.4 x 15.0 cm, new from 01/04/2020. Mild surrounding inflammatory stranding. Adrenals/Urinary Tract: Adrenal glands are unremarkable. Small staghorn type calculus in the right kidney. Stones are seen in the left kidney. Subcentimeter high and low-attenuation lesions in the kidneys are too small to characterize. Ureters are decompressed. Bladder is low in volume. Stomach/Bowel: Stomach is decompressed. Small bowel, appendix and colon are unremarkable. Vascular/Lymphatic: Atherosclerotic calcification of the aorta without aneurysm. Scattered lymph nodes are not enlarged by CT size criteria. Reproductive: Uterus is visualized. An air-filled loop of bowel is seen between the bladder  and uterus. Fluid fills the vagina. No adnexal mass. Other: Trace ascites in the left paracolic gutter. Small right inguinal hernia contains fat. Musculoskeletal: Asymmetric soft tissue haziness and stranding in the subcutaneous fat overlying the lower left ribs and spleen. No rib fracture. Expansile lucent lesions are again seen in the L3-4 facet joints and flanking the anterior sacroiliac joints bilaterally. The left L3-4 facet joint lesion impinges on the spinal canal, narrowing it. Degenerative changes in the spine. IMPRESSION: 1. Large, mildly complex fluid collection in the spleen, new from 01/04/2020. Given overlying haziness and stranding in the subcutaneous soft tissues, post-traumatic hematoma is favored. An abscess cannot be excluded. 2. Moderate left pleural effusion with collapse/consolidation in the left lower lobe. 3. Trace ascites. 4. Bilateral renal stones. 5. Expansile lucent lesions in the L3-4 facet joints and anterior sacroiliac joints, as before. Associated narrowing of the spinal canal at the L3-4 level. Please correlate clinically. Lumbar spine and pelvic MRI without and with contrast could be performed in further evaluation, as clinically indicated. 6.  Aortic atherosclerosis (ICD10-I70.0). Electronically Signed   By: Lorin Picket M.D.   On: 02/21/2020 08:48   DG Chest 2 View  Result Date: 03/05/2020 CLINICAL DATA:  Left-sided chest pain and cough for several weeks. Shortness of breath. EXAM: CHEST - 2 VIEW COMPARISON:  12/07/2019 FINDINGS: Moderate left pleural effusion is new since previous study, with associated compressive atelectasis. Right lung remains clear. No evidence of pneumothorax. Heart size remains stable. IMPRESSION: New moderate left pleural effusion and compressive atelectasis. Electronically Signed   By: Marlaine Hind M.D.   On: 03/03/2020 14:03   MR LUMBAR SPINE WO CONTRAST  Result Date: 02/21/2020 CLINICAL DATA:  Low back pain.  History of prostate cancer. EXAM:  MRI LUMBAR SPINE WITHOUT CONTRAST TECHNIQUE: Multiplanar, multisequence MR imaging of the lumbar spine was performed. No intravenous contrast was administered. COMPARISON:  CT abdomen 12/06/2019, 12/14/2019, 02/21/2020 FINDINGS: Segmentation:  Standard. Alignment:  Physiologic. Vertebrae: No discitis or osteomyelitis. No acute osseous abnormality. Marrow heterogeneity is present. Although this can be caused by marrow infiltrative processes, the most common causes include anemia, smoking, obesity, or advancing age. Extensive erosive changes and bony expansion centered around the L3-4 facet joints bilaterally left worse than right and to a lesser extent at L4-5. Erosive changes involving the anterior aspect of bilateral sacroiliac joints, right worse than left. The most severe erosion involves the left L3-4 facet with smooth margins. The expansile nature of the left L3-4 facet impresses on the left posterolateral thecal sac and neural foramen resulting in multifactorial severe spinal stenosis and left foraminal stenosis.  Conus medullaris and cauda equina: Conus extends to the T12 level. Conus and cauda equina appear normal. Paraspinal and other soft tissues: No acute paraspinal abnormality. Paraspinal muscle atrophy. Disc levels: Disc spaces: Degenerative disc disease with disc height loss at L2-3. T12-L1: Small left paracentral disc protrusion. No evidence of neural foraminal stenosis. No central canal stenosis. L1-L2: No significant disc bulge. No evidence of neural foraminal stenosis. No central canal stenosis. L2-L3: Broad-based disc bulge. Mild bilateral facet arthropathy. Bilateral lateral recess stenosis. No evidence of neural foraminal stenosis. Moderate spinal stenosis. L3-L4: Mild broad-based disc bulge. Bilateral facet arthropathy with erosive changes involving bilateral facets most severe along the left with expansion. Severe spinal stenosis. Moderate left foraminal stenosis. No right foraminal stenosis.  L4-L5: Minimal broad-based disc bulge. Moderate bilateral facet arthropathy with erosive changes particularly along the left L4-5 facet. No evidence of neural foraminal stenosis. No central canal stenosis. L5-S1: Minimal broad-based disc bulge. Mild bilateral facet arthropathy. No evidence of neural foraminal stenosis. No central canal stenosis. IMPRESSION: 1. Extensive erosive changes and bony expansion centered around the L3-4 facet joints bilaterally left worse than right and to a lesser extent at L4-5. The most severe erosion involves the left L3-4 facet with smooth margins. The expansile nature of the left L3-4 facet impresses on the left posterolateral thecal sac and neural foramen resulting in multifactorial severe spinal stenosis and left foraminal stenosis. Erosive changes involving the anterior aspect of bilateral sacroiliac joints, right worse than left. The overall appearance is suggestive of amyloid or gout. If there is further clinical concern, recommend tissue diagnosis. 2. Lumbar spine spondylosis as described above. Electronically Signed   By: Kathreen Devoid   On: 02/21/2020 15:31   MR PELVIS WO CONTRAST  Result Date: 02/21/2020 CLINICAL DATA:  Follow-up CT scan. EXAM: MRI PELVIS WITHOUT CONTRAST TECHNIQUE: Multiplanar multisequence MR imaging of the pelvis was performed. No intravenous contrast was administered. COMPARISON:  CT scan, same date. Prior CT scans dating back to 12/06/2019 FINDINGS: Examination is limited as the patient could not hold still for the examination and would not tolerate the postcontrast exam. Urinary Tract:  The bladder is grossly normal. Bowel: The rectum and sigmoid colon are unremarkable. Scattered diverticulosis involving the upper sigmoid colon in particular. Vascular/Lymphatic: No significant vascular findings or overt adenopathy. Reproductive: There is air in the vagina and in the endometrial canal. There is a large air collection just above the uterus in the area  of a previous abscess. A fistula is certainly a consideration. There is a complex heterogeneous mass along the posterior aspect of the lower uterine segment and cervix which is likely a partially calcified degenerated fibroid. Both ovaries are grossly normal. Other: Small amount of free pelvic fluid is noted. Diffuse subcutaneous inflammation. Musculoskeletal: Heterogeneous marrow signal likely due to obesity, smoking or anemia. Do not see any worrisome bone lesions. There are smoothly marginated well corticated lesions associated with both SI joints, unchanged since prior CT scans. No findings suspicious for septic arthritis or worrisome bone lesion. IMPRESSION: 1. Limited examination. 2. Gas is noted in the endometrium suspicious for a fistula. Large air collection above the uterus noted on MRI and prior CT. There was a large abscess in this area on more remote CT scans. 3. Partially calcified degenerated fibroid projecting off the posterior aspect of the lower uterine segment/cervix. 4. No findings for septic arthritis or worrisome bone lesion. 5. Small amount of free pelvic fluid. Electronically Signed   By: Ricky Stabs.D.  On: 02/21/2020 15:25   VAS [place] LOWER EXTREMITY VENOUS (DVT) (ONLY [facility] & [facility])  Result Date: 02/21/2020  Lower Venous DVT Study Indications: Edema, and elevated D-dimer.  Risk Factors: CHF. Limitations: Body habitus and edema. Comparison Study: no prior study on file Performing Technologist: Sharion Dove RVS  Examination Guidelines: A complete evaluation includes B-mode imaging, spectral Doppler, color Doppler, and power Doppler as needed of all accessible portions of each vessel. Bilateral testing is considered an integral part of a complete examination. Limited examinations for reoccurring indications may be performed as noted. The reflux portion of the exam is performed with the patient in reverse Trendelenburg.   +---------+---------------+---------+-----------+----------+-------------------+ RIGHT    CompressibilityPhasicitySpontaneityPropertiesThrombus Aging      +---------+---------------+---------+-----------+----------+-------------------+ CFV      Full                                         pulsatile waveform  +---------+---------------+---------+-----------+----------+-------------------+ SFJ      Full                                                             +---------+---------------+---------+-----------+----------+-------------------+ FV Prox  Full                                                             +---------+---------------+---------+-----------+----------+-------------------+ FV Mid   Full                                                             +---------+---------------+---------+-----------+----------+-------------------+ FV DistalFull                                                             +---------+---------------+---------+-----------+----------+-------------------+ PFV                                                   Not well visualized +---------+---------------+---------+-----------+----------+-------------------+ POP      Full                                         pulsatile waveform  +---------+---------------+---------+-----------+----------+-------------------+ PTV                                                   Not well visualized +---------+---------------+---------+-----------+----------+-------------------+ PERO  Not well visualized +---------+---------------+---------+-----------+----------+-------------------+   +---------+---------------+---------+-----------+----------+-------------------+ LEFT     CompressibilityPhasicitySpontaneityPropertiesThrombus Aging      +---------+---------------+---------+-----------+----------+-------------------+  CFV                                                   patent by color and                                                       Doppler, pulsatile                                                        waveform            +---------+---------------+---------+-----------+----------+-------------------+ FV Prox  Full                                                             +---------+---------------+---------+-----------+----------+-------------------+ FV Mid   Full                                                             +---------+---------------+---------+-----------+----------+-------------------+ FV DistalFull                                                             +---------+---------------+---------+-----------+----------+-------------------+ PFV                                                   Not well visualized +---------+---------------+---------+-----------+----------+-------------------+ POP      Full                                         pulsatile waveform  +---------+---------------+---------+-----------+----------+-------------------+ PTV                                                   Not well visualized +---------+---------------+---------+-----------+----------+-------------------+ PERO  Not well visualized +---------+---------------+---------+-----------+----------+-------------------+     Summary: RIGHT: - There is no evidence of deep vein thrombosis in the lower extremity. However, portions of this examination were limited- see technologist comments above.  LEFT: - There is no evidence of deep vein thrombosis in the lower extremity. However, portions of this examination were limited- see technologist comments above.  *See table(s) above for measurements and observations.    Preliminary     EKG: Independently reviewed.  Sinus bradycardia with rate 58; RBBB with no  evidence of acute ischemia   Labs on Admission: I have personally reviewed the available labs and imaging studies at the time of the admission.  Pertinent labs:   Glucose 108 BUN 49/Creatinine 3.43/GFR 14; 13/1.83/30 on 11/5 Albumin 1.8; 2.1 on 11/5 BNP 167.7 CK 20 HS troponin 11 WBC 15.4 Hgb 8.9; 9.4 on 11/5 Platelets 731; 480 on 11/5 D-dimer 6.57 COVID pending   Assessment/Plan Principal Problem:   Acute on chronic respiratory failure with hypoxia and hypercapnia (HCC) Active Problems:   Essential hypertension   OSA (obstructive sleep apnea)   Chronic systolic CHF (congestive heart failure) (HCC)   Morbid obesity with BMI of 50.0-59.9, adult (HCC)   Intra-abdominal abscess (HCC)   AKI (acute kidney injury) (HCC)   Pleural effusion on left   Acute metabolic encephalopathy   Hypoalbuminemia due to protein-calorie malnutrition (HCC)    Acute on chronic respiratory failure with hypoxia and hypercapnia -Patient with h/o septic shock due to intraabdominal abscess; she had perc drain catheter placement on 10/26 -She was seen by IR on 11/23 and had the perc drain removed with plan for outpatient surgery f/u -She presented today with 3 days of worsening SOB at home and reported abdominal pain (she did not complain to me about this at the time of my initial evaluation and did not have significant abdominal TTP - but she has a large pannus which limits the exam) -She was hypoxic to 92% and was started on [place] O2  -She has become less responsive and conversant throughout the afternoon and ABG now shows pCO2 75.7 so will start BIPAP -This is likely multifactorial respiratory failure associated with possible sepsis; pleural effusion; and OSA/OHS in the setting of BMI 51  Possible sepsis -Sepsis indicates life-threatening organ dysfunction with mortality >10%, caused by dysregulation to host response.   -SIRS criteria in this patient includes: Leukocytosis, tachypnea, hypoxia -Patient  has evidence of acute organ failure with encephalopathy; BIPAP-dependent respiratory failure; recurrent hypotension (SBP < 90 or MAP < 65 x 2 readings); creatinine >2 that is not easily explained by another condition. -While awaiting blood cultures, this appears to be a preseptic condition. -Sepsis protocol initiated -Patient had SBP <90/MAP <65 and so has received the 30 cc/kg IVF bolus (for ideal weight). -Suspected source is unknown, but concern would be for recurrent intraabdominal infection -Blood and urine cultures pending -Will admit due to: hemodynamic instability; hypoxemia; AMS that is severe or persistent -Treat with IV Cefepime/Vanc/Flagyl for undifferentiated sepsis with possible intraabdominal infection -Will trend lactate to ensure improvement -Will order procalcitonin level.  Antibiotics would not be indicated for PCT <0.1 and probably should not be used for < 0.25.  >0.5 indicates infection and >>0.5 indicates more serious disease.  As the procalcitonin level normalizes, it will be reasonable to consider de-escalation of antibiotic coverage. -This patient is at risk for shock and may require vasopressors to keep MAP >65 and/or due to lactate >2 despite volume resuscitation; shock is associated with >40%  mortality. -Her D-dimer was markedly elevated and so the EDP ordered LE dopplers (negative). -D-dimer elevation currently appears more likely related to infection. -PCCM consult has been requested  Splenic fluid collection with recent intraabdominal abscess and sepsis -CT A/P was ordered overnight and resulted this AM, showing splenic fluid collection thought more likely related to post-operative hematoma than abscess -CT also showed expansile lucent lesions along the C-spine and SI joints; MRI was recommended -MRI was performed and showed extensive erosive changes and bony expansion along L3-5 with multifactorial severe spinal stenosis and L foraminal stenosis along with erosive  changes of the B SI joints - suggestive of amyloid or gout -There is also an apparent air collection above the uterus and gas in the endometrium, suggestive of a fistula and possibly related to prior IR drain -Based on these findings, I have requested surgical consultation and Dr. Zenia Resides will see the patient  L pleural effusion -CT this AM showed moderate L pleural effusion with collapse/consolidation in the LLL -Thoracentesis was ordered with studies of the pleural fluid, but the patient had become altered prior to IR coming for transport -As such thoracentesis is currently being held -PCCM has been consulted and can determine whether this should be performed at this time  AKI -This appears to be related to volume depletion, possibly in the setting of infection -Will hydrate and follow -Hold Lasix for now -She has also been taking Aleve as needed for pain  Severe malnutrition with marked hypoalbuminemia, with severe morbid obesity -Body mass index is 51.49 kg/m..  -The patient has at least 2 indicators for malnutrition (insufficient energy intake, loss of muscle mass, diminished functional status).  -This is likely due to chronic disease -Will obtain a nutrition consult for further recommendations. -Given her significantly low albumin, she is at significant risk for third spacing with volume overload.  HTN -Hold Coreg in the setting of low BP in the ER    Note: This patient has been tested and is negative for the novel coronavirus COVID-19. The patient has NOT been vaccinated against COVID-19.    Total critical care time: 65 minutes Critical care time was exclusive of separately billable procedures and treating other patients. Critical care was necessary to treat or prevent imminent or life-threatening deterioration. Critical care was time spent personally by me on the following activities: development of treatment plan with patient and/or surrogate as well as nursing,  discussions with consultants, evaluation of patient's response to treatment, examination of patient, obtaining history from patient or surrogate, ordering and performing treatments and interventions, ordering and review of laboratory studies, ordering and review of radiographic studies, pulse oximetry and re-evaluation of patient's condition.    DVT prophylaxis:  Lovenox Code Status:  DNR - confirmed with patient Family Communication: None present Disposition Plan:  The patient is from: home  Anticipated d/c is to: be determined  Anticipated d/c date will depend on clinical response to treatment, likely several days Patient is currently: acutely ill Consults called: PCCM; Surgery; Nutrition Admission status: Admit - It is my clinical opinion that admission to INPATIENT is reasonable and necessary because of the expectation that this patient will require hospital care that crosses at least 2 midnights to treat this condition based on the medical complexity of the problems presented.  Given the aforementioned information, the predictability of an adverse outcome is felt to be significant.      Karmen Bongo MD [facility]   How to contact the [facility] Attending or Consulting provider 7A -  7P or covering provider during after hours [place], for this patient?  1. Check the care team in [facility] and look for a) attending/consulting [facility] provider listed and b) the [facility] team listed 2. Log into [email or web address] and use [place]'s universal password to access. If you do not have the password, please contact the hospital operator. 3. Locate the [facility] provider you are looking for under [facility] and page to a number that you can be directly reached. 4. If you still have difficulty reaching the provider, please page the [facility] (Director on Call) for the Hospitalists listed on amion for assistance.   02/21/2020, 5:03 PM

## 2020-02-21 NOTE — ED Notes (Signed)
Pt transported to vascular.  °

## 2020-02-21 NOTE — Progress Notes (Signed)
Notified bedside nurse of need to draw lactic acid and blood cultures and give antibiotics. MD has ordered 1800 cc fluid bolus (counting this am's liter).

## 2020-02-21 NOTE — ED Notes (Signed)
Pt returned from vascular

## 2020-02-21 NOTE — ED Notes (Signed)
Pt transported to IR 

## 2020-02-21 NOTE — Progress Notes (Signed)
Patient transported from ED to 3E20 on bipap. RN at bedside.

## 2020-02-21 NOTE — Progress Notes (Signed)
Report called to 2M 

## 2020-02-21 NOTE — Progress Notes (Signed)
   02/21/20 2100  Assess: MEWS Score  BP 96/67  ECG Heart Rate (!) 53  Resp 19  Level of Consciousness Unresponsive  SpO2 100 %  O2 Device Bi-PAP  Assess: MEWS Score  MEWS Temp 0  MEWS Systolic 1  MEWS Pulse 0  MEWS RR 0  MEWS LOC 3  MEWS Score 4  MEWS Score Color Red  Assess: if the MEWS score is Yellow or Red  Were vital signs taken at a resting state? Yes  Focused Assessment No change from prior assessment  Early Detection of Sepsis Score *See Row Information* Medium  MEWS guidelines implemented *See Row Information* Yes  Treat  Pain Scale 0-10  Pain Score Asleep  Take Vital Signs  Increase Vital Sign Frequency  Red: Q 1hr X 4 then Q 4hr X 4, if remains red, continue Q 4hrs  Escalate  MEWS: Escalate Red: discuss with charge nurse/RN and provider, consider discussing with RRT  Notify: Charge Nurse/RN  Name of Charge Nurse/RN Notified Danae Chen Rn  Date Charge Nurse/RN Notified 02/21/20  Time Charge Nurse/RN Notified 1947  Notify: Provider  Provider Name/Title PCCM  Date Provider Notified 02/21/20  Time Provider Notified 2100  Notification Type Call  Notification Reason Other (Comment) (plan of care)

## 2020-02-21 NOTE — Consult Note (Signed)
Phyllis Todd 1951-01-11  [id number].    Requesting MD: Dr. Karmen Bongo Chief Complaint/Reason for Consult: splenic fluid collection  HPI:  Phyllis Todd who was recently admitted in Oct 2021 with intraabdominal abscess secondary to sigmoid diverticulitis. She had a percutaneous drain placed by IR, which has since been removed. She was seen in clinic by Dr. Johney Maine to discuss elective sigmoid resection, however she elected not to undergo surgical intervention due to increased risk associated with her medical comorbidities.   She presented to the ED overnight with worsening shortness of breath and also reported abdominal pain. She was slightly hypotensive and responded to fluid. Labs were significant for a leukocytosis and AKI. A non-con CT abd/pelvis was obtained, which showed a new fluid collection within the spleen. General surgery was consulted. Throughout the day the patient has had progressive somnolence and is now hypercarbic, for which she has been placed on BiPAP. Sepsis protocol has been initiated. At the time of my exam she was arousable but too disoriented to provide a history.   ROS: Review of Systems  Unable to perform ROS: Mental status change    Family History  Problem Relation Age of Onset  . Hypertension Mother   . Heart failure Mother   . Stroke Mother   . CAD Father   . Hypertension Father   . Stroke Brother 27  . Diabetes Brother   . Hypertension Brother   . Breast cancer Maternal Grandmother     Past Medical History:  Diagnosis Date  . 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 Aortic atherosclerosis ([place])   . Diverticulitis   . Diverticulosis   . Enteritis   . Hypertension   . Morbid obesity ([place])   . NASH (nonalcoholic steatohepatitis)   . Nephrolithiasis   . NICM (nonischemic cardiomyopathy) ([place]) 06/2013  . Obstructive sleep apnea (adult)  (pediatric) 06/2013  . Septic shock [facility])     Past Surgical History:  Procedure Laterality Date  . CARDIAC CATHETERIZATION  2015  . CESAREAN SECTION    . DILATION AND CURETTAGE OF UTERUS  1984  . IR RADIOLOGIST EVAL & MGMT  01/04/2020  . LEFT HEART CATHETERIZATION WITH CORONARY ANGIOGRAM N/A 07/12/2013   Procedure: LEFT HEART CATHETERIZATION WITH CORONARY ANGIOGRAM;  Surgeon: Troy Sine, MD;  Location: [facility] CATH LAB;  Service: Cardiovascular;  Laterality: N/A;  . TONSILLECTOMY      Social History:  reports that she has never smoked. She has never used smokeless tobacco. She reports current alcohol use. She reports that she does not use drugs.  Allergies:  Allergies  Allergen Reactions  . Codeine Other (See Comments)    "makes me hyper"  . Demerol [Meperidine] Other (See Comments)    "sick"  . Lisinopril Cough  . Peanut-Containing Drug Products Hives and Swelling    "almonds"    (Not in a hospital admission)    Physical Exam: Blood pressure (!) 100/54, pulse 61, temperature 98.1 F (36.7 C), temperature source Oral, resp. rate (!) 23, height 5\' 4"  (1.626 m), weight 136.1 kg, SpO2 97 %. General: resting comfortably, on BiPAP Neurological: somnolent, arousable but disoriented HEENT: normocephalic, atraumatic, oropharynx clear, no scleral icterus CV: regular rate and rhythm, extremities warm and well-perfused Respiratory: normal work of breathing on BiPAP, symmetric chest wall expansion Abdomen: soft, nondistended, nontender to deep palpation.  No masses or organomegaly. Extremities: warm and well-perfused, no deformities, moving all extremities spontaneously Skin: warm and dry, no jaundice, no rashes or lesions   Results for orders placed or performed during the hospital encounter of 24-Feb-2020 (from the past 48 hour(s))  Lipase, blood     Status: None   Collection Time: 24-Feb-2020  1:24 PM  Result Value Ref Range   Lipase 32 11 - 51 U/L    Comment: Performed at [facility], [address] [place], [place] [address]  Comprehensive metabolic panel     Status: Abnormal   Collection Time: 2020-02-24  1:24 PM  Result Value Ref Range   Sodium 138 135 - 145 mmol/L   Potassium 5.1 3.5 - 5.1 mmol/L   Chloride 100 98 - 111 mmol/L   CO2 28 22 - 32 mmol/L   Glucose, Bld 108 (H) 70 - 99 mg/dL    Comment: Glucose reference range applies only to samples taken after fasting for at least 8 hours.   BUN 49 (H) 8 - 23 mg/dL   Creatinine, Ser 0.62 (H) 0.44 - 1.00 mg/dL   Calcium 69.4 (H) 8.9 - 10.3 mg/dL   Total Protein 7.3 6.5 - 8.1 g/dL   Albumin 1.8 (L) 3.5 - 5.0 g/dL   AST 11 (L) 15 - 41 U/L   ALT 14 0 - 44 U/L   Alkaline Phosphatase 119 38 - 126 U/L   Total Bilirubin 0.7 0.3 - 1.2 mg/dL   GFR, Estimated 14 (L) >60 mL/min    Comment: (NOTE) Calculated using the CKD-EPI Creatinine Equation (2021)    Anion gap 10 5 - 15    Comment: Performed at [facility], [address], [place], [place] [address]  CBC     Status: Abnormal   Collection Time: 02/24/2020  1:24 PM  Result Value Ref Range   WBC 15.4 (H) 4.0 - 10.5 K/uL   RBC 3.03 (L) 3.87 - 5.11 MIL/uL   Hemoglobin 8.9 (L) 12.0 - 15.0 g/dL   HCT 70.3 (L) 50.0 - 93.8 %   MCV 100.3 (H) 80.0 - 100.0 fL   MCH 29.4 26.0 - 34.0 pg   MCHC 29.3 (L) 30.0 - 36.0 g/dL   RDW 18.2 (H) 99.3 - 71.6 %   Platelets 731 (H) 150 - 400 K/uL   nRBC 0.0 0.0 - 0.2 %    Comment: Performed at [facility], [address] [place], [place] [address]  Troponin I (High Sensitivity)     Status: None   Collection Time: 02/21/20  3:56 AM  Result Value Ref Range   Troponin I (High Sensitivity) 11 <18 ng/L    Comment: (NOTE) Elevated high sensitivity troponin I (hsTnI) values and significant  changes across serial measurements may suggest ACS but many other  chronic and acute conditions are known to elevate hsTnI results.  Refer to the "Links" section for chest pain algorithms and additional  guidance. Performed at [facility], [address], [place], [place] [address]   Brain natriuretic peptide     Status: Abnormal   Collection Time: 02/21/20  3:56 AM  Result Value Ref Range   B Natriuretic Peptide 167.7 (H) 0.0 - 100.0 pg/mL    Comment: Performed at [facility], [address] [place], [place] [address]  D-dimer, quantitative     Status: Abnormal   Collection Time: 02/21/20  3:56 AM  Result Value Ref Range  D-Dimer, Quant 6.57 (H) 0.00 - 0.50 ug/mL-FEU    Comment: (NOTE) At the manufacturer cut-off value of 0.5 g/mL FEU, this assay has a negative predictive value of 95-100%.This assay is intended for use in conjunction with a clinical pretest probability (PTP) assessment model to exclude pulmonary embolism (PE) and deep venous thrombosis (DVT) in outpatients suspected of PE or DVT. Results should be correlated with clinical presentation. Performed at [facility], [place] [address], [place], [place] [address]   CK     Status: Abnormal   Collection Time: 02/21/20  3:56 AM  Result Value Ref Range   Total CK 20 (L) 38 - 234 U/L    Comment: Performed at [facility], [place] [address] [place], [place] [address]  SARS CORONAVIRUS 2 (TAT 6-24 HRS) Nasopharyngeal Nasopharyngeal Swab     Status: None   Collection Time: 02/21/20  5:06 AM   Specimen: Nasopharyngeal Swab  Result Value Ref Range   SARS Coronavirus 2 NEGATIVE NEGATIVE    Comment: (NOTE) SARS-CoV-2 target nucleic acids are NOT DETECTED.  The SARS-CoV-2 RNA is generally detectable in upper and lower respiratory specimens during the acute phase of infection. Negative results do not preclude SARS-CoV-2 infection, do not rule out co-infections with other pathogens, and should not be used as the sole basis for treatment or other patient management decisions. Negative results must be combined with clinical observations, patient history, and epidemiological information. The expected result is Negative.  Fact Sheet  for Patients: SugarRoll.be  Fact Sheet for Healthcare Providers: https://www.woods-mathews.com/  This test is not yet approved or cleared by the [place] FDA and  has been authorized for detection and/or diagnosis of SARS-CoV-2 by FDA under an Emergency Use Authorization (EUA). This EUA will remain  in effect (meaning this test can be used) for the duration of the COVID-19 declaration under Se ction 564(b)(1) of the Act, 21 U.S.C. section 360bbb-3(b)(1), unless the authorization is terminated or revoked sooner.  Performed at [facility], [place] [address] [place], [place] [address]   I-Stat arterial blood gas, [facility] ED)     Status: Abnormal   Collection Time: 02/21/20  4:48 PM  Result Value Ref Range   pH, Arterial 7.232 (L) 7.350 - 7.450   pCO2 arterial 76.0 (HH) 32.0 - 48.0 mmHg   pO2, Arterial 96 83.0 - 108.0 mmHg   Bicarbonate 32.0 (H) 20.0 - 28.0 mmol/L   TCO2 34 (H) 22 - 32 mmol/L   O2 Saturation 95.0 %   Acid-Base Excess 3.0 (H) 0.0 - 2.0 mmol/L   Sodium 139 135 - 145 mmol/L   Potassium 4.9 3.5 - 5.1 mmol/L   Calcium, Ion 1.53 (HH) 1.15 - 1.40 mmol/L   HCT 27.0 (L) 36.0 - 46.0 %   Hemoglobin 9.2 (L) 12.0 - 15.0 g/dL   Patient temperature 98.8 F    Collection site Radial    Drawn by RT    Sample type ARTERIAL    Comment NOTIFIED PHYSICIAN    CT ABDOMEN PELVIS WO CONTRAST  Result Date: 02/21/2020 CLINICAL DATA:  Abdominal abscess/infection suspected. Diffuse abdominal pain going on for a couple of days. EXAM: CT ABDOMEN AND PELVIS WITHOUT CONTRAST TECHNIQUE: Multidetector CT imaging of the abdomen and pelvis was performed following the standard protocol without IV contrast. COMPARISON:  01/04/2020. FINDINGS: Lower chest: Collapse/consolidation in the lingula and left lower lobe with a moderate left pleural effusion. Heart is enlarged. No pericardial effusion. Hepatobiliary: Liver and gallbladder are  unremarkable. No biliary  ductal dilatation. Pancreas: Negative. Spleen: There is a large heterogeneous fluid collection in the spleen, measuring approximately 10.4 x 15.0 cm, new from 01/04/2020. Mild surrounding inflammatory stranding. Adrenals/Urinary Tract: Adrenal glands are unremarkable. Small staghorn type calculus in the right kidney. Stones are seen in the left kidney. Subcentimeter high and low-attenuation lesions in the kidneys are too small to characterize. Ureters are decompressed. Bladder is low in volume. Stomach/Bowel: Stomach is decompressed. Small bowel, appendix and colon are unremarkable. Vascular/Lymphatic: Atherosclerotic calcification of the aorta without aneurysm. Scattered lymph nodes are not enlarged by CT size criteria. Reproductive: Uterus is visualized. An air-filled loop of bowel is seen between the bladder and uterus. Fluid fills the vagina. No adnexal mass. Other: Trace ascites in the left paracolic gutter. Small right inguinal hernia contains fat. Musculoskeletal: Asymmetric soft tissue haziness and stranding in the subcutaneous fat overlying the lower left ribs and spleen. No rib fracture. Expansile lucent lesions are again seen in the L3-4 facet joints and flanking the anterior sacroiliac joints bilaterally. The left L3-4 facet joint lesion impinges on the spinal canal, narrowing it. Degenerative changes in the spine. IMPRESSION: 1. Large, mildly complex fluid collection in the spleen, new from 01/04/2020. Given overlying haziness and stranding in the subcutaneous soft tissues, post-traumatic hematoma is favored. An abscess cannot be excluded. 2. Moderate left pleural effusion with collapse/consolidation in the left lower lobe. 3. Trace ascites. 4. Bilateral renal stones. 5. Expansile lucent lesions in the L3-4 facet joints and anterior sacroiliac joints, as before. Associated narrowing of the spinal canal at the L3-4 level. Please correlate clinically. Lumbar spine and pelvic MRI without and with contrast  could be performed in further evaluation, as clinically indicated. 6.  Aortic atherosclerosis (ICD10-I70.0). Electronically Signed   By: Lorin Picket M.D.   On: 02/21/2020 08:48   DG Chest 2 View  Result Date: 02/24/2020 CLINICAL DATA:  Left-sided chest pain and cough for several weeks. Shortness of breath. EXAM: CHEST - 2 VIEW COMPARISON:  12/07/2019 FINDINGS: Moderate left pleural effusion is new since previous study, with associated compressive atelectasis. Right lung remains clear. No evidence of pneumothorax. Heart size remains stable. IMPRESSION: New moderate left pleural effusion and compressive atelectasis. Electronically Signed   By: Marlaine Hind M.D.   On: 02/29/2020 14:03   MR LUMBAR SPINE WO CONTRAST  Result Date: 02/21/2020 CLINICAL DATA:  Low back pain.  History of prostate cancer. EXAM: MRI LUMBAR SPINE WITHOUT CONTRAST TECHNIQUE: Multiplanar, multisequence MR imaging of the lumbar spine was performed. No intravenous contrast was administered. COMPARISON:  CT abdomen 12/06/2019, 12/14/2019, 02/21/2020 FINDINGS: Segmentation:  Standard. Alignment:  Physiologic. Vertebrae: No discitis or osteomyelitis. No acute osseous abnormality. Marrow heterogeneity is present. Although this can be caused by marrow infiltrative processes, the most common causes include anemia, smoking, obesity, or advancing age. Extensive erosive changes and bony expansion centered around the L3-4 facet joints bilaterally left worse than right and to a lesser extent at L4-5. Erosive changes involving the anterior aspect of bilateral sacroiliac joints, right worse than left. The most severe erosion involves the left L3-4 facet with smooth margins. The expansile nature of the left L3-4 facet impresses on the left posterolateral thecal sac and neural foramen resulting in multifactorial severe spinal stenosis and left foraminal stenosis. Conus medullaris and cauda equina: Conus extends to the T12 level. Conus and cauda equina  appear normal. Paraspinal and other soft tissues: No acute paraspinal abnormality. Paraspinal muscle atrophy. Disc levels: Disc spaces: Degenerative disc  disease with disc height loss at L2-3. T12-L1: Small left paracentral disc protrusion. No evidence of neural foraminal stenosis. No central canal stenosis. L1-L2: No significant disc bulge. No evidence of neural foraminal stenosis. No central canal stenosis. L2-L3: Broad-based disc bulge. Mild bilateral facet arthropathy. Bilateral lateral recess stenosis. No evidence of neural foraminal stenosis. Moderate spinal stenosis. L3-L4: Mild broad-based disc bulge. Bilateral facet arthropathy with erosive changes involving bilateral facets most severe along the left with expansion. Severe spinal stenosis. Moderate left foraminal stenosis. No right foraminal stenosis. L4-L5: Minimal broad-based disc bulge. Moderate bilateral facet arthropathy with erosive changes particularly along the left L4-5 facet. No evidence of neural foraminal stenosis. No central canal stenosis. L5-S1: Minimal broad-based disc bulge. Mild bilateral facet arthropathy. No evidence of neural foraminal stenosis. No central canal stenosis. IMPRESSION: 1. Extensive erosive changes and bony expansion centered around the L3-4 facet joints bilaterally left worse than right and to a lesser extent at L4-5. The most severe erosion involves the left L3-4 facet with smooth margins. The expansile nature of the left L3-4 facet impresses on the left posterolateral thecal sac and neural foramen resulting in multifactorial severe spinal stenosis and left foraminal stenosis. Erosive changes involving the anterior aspect of bilateral sacroiliac joints, right worse than left. The overall appearance is suggestive of amyloid or gout. If there is further clinical concern, recommend tissue diagnosis. 2. Lumbar spine spondylosis as described above. Electronically Signed   By: Kathreen Devoid   On: 02/21/2020 15:31   MR PELVIS  WO CONTRAST  Result Date: 02/21/2020 CLINICAL DATA:  Follow-up CT scan. EXAM: MRI PELVIS WITHOUT CONTRAST TECHNIQUE: Multiplanar multisequence MR imaging of the pelvis was performed. No intravenous contrast was administered. COMPARISON:  CT scan, same date. Prior CT scans dating back to 12/06/2019 FINDINGS: Examination is limited as the patient could not hold still for the examination and would not tolerate the postcontrast exam. Urinary Tract:  The bladder is grossly normal. Bowel: The rectum and sigmoid colon are unremarkable. Scattered diverticulosis involving the upper sigmoid colon in particular. Vascular/Lymphatic: No significant vascular findings or overt adenopathy. Reproductive: There is air in the vagina and in the endometrial canal. There is a large air collection just above the uterus in the area of a previous abscess. A fistula is certainly a consideration. There is a complex heterogeneous mass along the posterior aspect of the lower uterine segment and cervix which is likely a partially calcified degenerated fibroid. Both ovaries are grossly normal. Other: Small amount of free pelvic fluid is noted. Diffuse subcutaneous inflammation. Musculoskeletal: Heterogeneous marrow signal likely due to obesity, smoking or anemia. Do not see any worrisome bone lesions. There are smoothly marginated well corticated lesions associated with both SI joints, unchanged since prior CT scans. No findings suspicious for septic arthritis or worrisome bone lesion. IMPRESSION: 1. Limited examination. 2. Gas is noted in the endometrium suspicious for a fistula. Large air collection above the uterus noted on MRI and prior CT. There was a large abscess in this area on more remote CT scans. 3. Partially calcified degenerated fibroid projecting off the posterior aspect of the lower uterine segment/cervix. 4. No findings for septic arthritis or worrisome bone lesion. 5. Small amount of free pelvic fluid. Electronically Signed    By: Marijo Sanes M.D.   On: 02/21/2020 15:25   VAS [place] LOWER EXTREMITY VENOUS (DVT) (ONLY [facility] & [facility])  Result Date: 02/21/2020  Lower Venous DVT Study Indications: Edema, and elevated D-dimer.  Risk Factors: Todd. Limitations:  Body habitus and edema. Comparison Study: no prior study on file Performing Technologist: Sharion Dove RVS  Examination Guidelines: A complete evaluation includes B-mode imaging, spectral Doppler, color Doppler, and power Doppler as needed of all accessible portions of each vessel. Bilateral testing is considered an integral part of a complete examination. Limited examinations for reoccurring indications may be performed as noted. The reflux portion of the exam is performed with the patient in reverse Trendelenburg.  +---------+---------------+---------+-----------+----------+-------------------+ RIGHT    CompressibilityPhasicitySpontaneityPropertiesThrombus Aging      +---------+---------------+---------+-----------+----------+-------------------+ CFV      Full                                         pulsatile waveform  +---------+---------------+---------+-----------+----------+-------------------+ SFJ      Full                                                             +---------+---------------+---------+-----------+----------+-------------------+ FV Prox  Full                                                             +---------+---------------+---------+-----------+----------+-------------------+ FV Mid   Full                                                             +---------+---------------+---------+-----------+----------+-------------------+ FV DistalFull                                                             +---------+---------------+---------+-----------+----------+-------------------+ PFV                                                   Not well visualized  +---------+---------------+---------+-----------+----------+-------------------+ POP      Full                                         pulsatile waveform  +---------+---------------+---------+-----------+----------+-------------------+ PTV                                                   Not well visualized +---------+---------------+---------+-----------+----------+-------------------+ PERO  Not well visualized +---------+---------------+---------+-----------+----------+-------------------+   +---------+---------------+---------+-----------+----------+-------------------+ LEFT     CompressibilityPhasicitySpontaneityPropertiesThrombus Aging      +---------+---------------+---------+-----------+----------+-------------------+ CFV                                                   patent by color and                                                       Doppler, pulsatile                                                        waveform            +---------+---------------+---------+-----------+----------+-------------------+ FV Prox  Full                                                             +---------+---------------+---------+-----------+----------+-------------------+ FV Mid   Full                                                             +---------+---------------+---------+-----------+----------+-------------------+ FV DistalFull                                                             +---------+---------------+---------+-----------+----------+-------------------+ PFV                                                   Not well visualized +---------+---------------+---------+-----------+----------+-------------------+ POP      Full                                         pulsatile waveform  +---------+---------------+---------+-----------+----------+-------------------+  PTV                                                   Not well visualized +---------+---------------+---------+-----------+----------+-------------------+ PERO  Not well visualized +---------+---------------+---------+-----------+----------+-------------------+     Summary: RIGHT: - There is no evidence of deep vein thrombosis in the lower extremity. However, portions of this examination were limited- see technologist comments above.  LEFT: - There is no evidence of deep vein thrombosis in the lower extremity. However, portions of this examination were limited- see technologist comments above.  *See table(s) above for measurements and observations.    Preliminary       Assessment/Plan 70 yo female presenting with hypercarbic respiratory failure and concern for sepsis of unclear etiology. I reviewed her imaging. CT scan shows a fluid collection within the spleen that was not present on prior CT scans in November. There is no gas to suggest infection. More likely hematoma. Unable to discern if patient has had any recent trauma. Would not recommend drainage as this would have high risk of bleeding. She also has a gas collection without fluid or abscess in the pelvis where her prior abscess was. On exam the abdomen is soft and nontender. Source of her altered mental status and respiratory failure does not appear to be intraabdominal. Recommend evaluation for other infectious etiologies. Surgery will continue to follow.   Michaelle Birks, [facility], Hepatobiliary and Pancreatic Surgery 02/21/20 5:46 PM

## 2020-02-21 NOTE — ED Notes (Addendum)
Pt transferred from wheelchair to stretcher bed by this RN, Ubaldo Glassing, RN, and [place], NT. Pt audibly SOB and wheezing after transfer, stated difficulty breathing. Placed 2L [place] while Seria collects vital signs

## 2020-02-22 DIAGNOSIS — J9621 Acute and chronic respiratory failure with hypoxia: Secondary | ICD-10-CM

## 2020-02-22 DIAGNOSIS — J9622 Acute and chronic respiratory failure with hypercapnia: Secondary | ICD-10-CM | POA: Diagnosis not present

## 2020-02-22 LAB — CBC WITH DIFFERENTIAL/PLATELET
Abs Immature Granulocytes: 0.1 10*3/uL — ABNORMAL HIGH (ref 0.00–0.07)
Basophils Absolute: 0 10*3/uL (ref 0.0–0.1)
Basophils Relative: 0 %
Eosinophils Absolute: 0.1 10*3/uL (ref 0.0–0.5)
Eosinophils Relative: 1 %
HCT: 28.3 % — ABNORMAL LOW (ref 36.0–46.0)
Hemoglobin: 8.1 g/dL — ABNORMAL LOW (ref 12.0–15.0)
Immature Granulocytes: 1 %
Lymphocytes Relative: 7 %
Lymphs Abs: 0.8 10*3/uL (ref 0.7–4.0)
MCH: 29.8 pg (ref 26.0–34.0)
MCHC: 28.6 g/dL — ABNORMAL LOW (ref 30.0–36.0)
MCV: 104 fL — ABNORMAL HIGH (ref 80.0–100.0)
Monocytes Absolute: 0.8 10*3/uL (ref 0.1–1.0)
Monocytes Relative: 7 %
Neutro Abs: 9.8 10*3/uL — ABNORMAL HIGH (ref 1.7–7.7)
Neutrophils Relative %: 84 %
Platelets: 649 10*3/uL — ABNORMAL HIGH (ref 150–400)
RBC: 2.72 MIL/uL — ABNORMAL LOW (ref 3.87–5.11)
RDW: 18.6 % — ABNORMAL HIGH (ref 11.5–15.5)
WBC: 11.7 10*3/uL — ABNORMAL HIGH (ref 4.0–10.5)
nRBC: 0 % (ref 0.0–0.2)

## 2020-02-22 LAB — URINALYSIS, ROUTINE W REFLEX MICROSCOPIC
Bilirubin Urine: NEGATIVE
Glucose, UA: NEGATIVE mg/dL
Ketones, ur: NEGATIVE mg/dL
Leukocytes,Ua: NEGATIVE
Nitrite: NEGATIVE
Protein, ur: 30 mg/dL — AB
Specific Gravity, Urine: 1.018 (ref 1.005–1.030)
pH: 5 (ref 5.0–8.0)

## 2020-02-22 LAB — POCT I-STAT 7, (LYTES, BLD GAS, ICA,H+H)
Acid-Base Excess: 3 mmol/L — ABNORMAL HIGH (ref 0.0–2.0)
Acid-Base Excess: 3 mmol/L — ABNORMAL HIGH (ref 0.0–2.0)
Bicarbonate: 31.5 mmol/L — ABNORMAL HIGH (ref 20.0–28.0)
Bicarbonate: 31.8 mmol/L — ABNORMAL HIGH (ref 20.0–28.0)
Calcium, Ion: 1.57 mmol/L (ref 1.15–1.40)
Calcium, Ion: 1.58 mmol/L (ref 1.15–1.40)
HCT: 26 % — ABNORMAL LOW (ref 36.0–46.0)
HCT: 27 % — ABNORMAL LOW (ref 36.0–46.0)
Hemoglobin: 8.8 g/dL — ABNORMAL LOW (ref 12.0–15.0)
Hemoglobin: 9.2 g/dL — ABNORMAL LOW (ref 12.0–15.0)
O2 Saturation: 96 %
O2 Saturation: 97 %
Patient temperature: 96.2
Patient temperature: 97.2
Potassium: 5.1 mmol/L (ref 3.5–5.1)
Potassium: 4.9 mmol/L (ref 3.5–5.1)
Sodium: 142 mmol/L (ref 135–145)
Sodium: 141 mmol/L (ref 135–145)
TCO2: 34 mmol/L — ABNORMAL HIGH (ref 22–32)
TCO2: 34 mmol/L — ABNORMAL HIGH (ref 22–32)
pCO2 arterial: 68.8 mmHg (ref 32.0–48.0)
pCO2 arterial: 75.8 mmHg (ref 32.0–48.0)
pH, Arterial: 7.261 — ABNORMAL LOW (ref 7.350–7.450)
pH, Arterial: 7.226 — ABNORMAL LOW (ref 7.350–7.450)
pO2, Arterial: 95 mmHg (ref 83.0–108.0)
pO2, Arterial: 107 mmHg (ref 83.0–108.0)

## 2020-02-22 LAB — COMPREHENSIVE METABOLIC PANEL
ALT: 11 U/L (ref 0–44)
AST: 12 U/L — ABNORMAL LOW (ref 15–41)
Albumin: 1.8 g/dL — ABNORMAL LOW (ref 3.5–5.0)
Alkaline Phosphatase: 97 U/L (ref 38–126)
Anion gap: 13 (ref 5–15)
BUN: 50 mg/dL — ABNORMAL HIGH (ref 8–23)
CO2: 21 mmol/L — ABNORMAL LOW (ref 22–32)
Calcium: 11.5 mg/dL — ABNORMAL HIGH (ref 8.9–10.3)
Chloride: 105 mmol/L (ref 98–111)
Creatinine, Ser: 3.19 mg/dL — ABNORMAL HIGH (ref 0.44–1.00)
GFR, Estimated: 15 mL/min — ABNORMAL LOW (ref >60)
Glucose, Bld: 103 mg/dL — ABNORMAL HIGH (ref 70–99)
Potassium: 5.1 mmol/L (ref 3.5–5.1)
Sodium: 139 mmol/L (ref 135–145)
Total Bilirubin: 0.2 mg/dL — ABNORMAL LOW (ref 0.3–1.2)
Total Protein: 6.4 g/dL — ABNORMAL LOW (ref 6.5–8.1)

## 2020-02-22 LAB — GLUCOSE, CAPILLARY
Glucose-Capillary: 75 mg/dL (ref 70–99)
Glucose-Capillary: 60 mg/dL — ABNORMAL LOW (ref 70–99)
Glucose-Capillary: 91 mg/dL (ref 70–99)
Glucose-Capillary: 74 mg/dL (ref 70–99)
Glucose-Capillary: 72 mg/dL (ref 70–99)
Glucose-Capillary: 75 mg/dL (ref 70–99)
Glucose-Capillary: 69 mg/dL — ABNORMAL LOW (ref 70–99)

## 2020-02-22 LAB — MRSA PCR SCREENING: MRSA by PCR: NEGATIVE

## 2020-02-22 MED ORDER — LACTATED RINGERS IV BOLUS
1000.0000 mL | Freq: Once | INTRAVENOUS | Status: AC
  Administered 2020-02-22: 1000 mL via INTRAVENOUS

## 2020-02-22 MED ORDER — SODIUM CHLORIDE 0.9 % IV SOLN
100.0000 mL/h | INTRAVENOUS | Status: DC
  Administered 2020-02-22 – 2020-02-23 (×2): 100 mL/h via INTRAVENOUS

## 2020-02-22 MED ORDER — CALCITONIN (SALMON) 200 UNIT/ML IJ SOLN
4.0000 [IU]/kg | Freq: Two times a day (BID) | INTRAMUSCULAR | Status: DC
  Filled 2020-02-22 (×2): qty 1.75

## 2020-02-22 MED ORDER — VANCOMYCIN HCL 2000 MG/400ML IV SOLN
2000.0000 mg | Freq: Two times a day (BID) | INTRAVENOUS | Status: DC
  Administered 2020-02-22: 2000 mg via INTRAVENOUS
  Filled 2020-02-22: qty 400

## 2020-02-22 MED ORDER — ZOLEDRONIC ACID 4 MG/5ML IV CONC
2.8000 mg | Freq: Once | INTRAVENOUS | Status: AC
  Administered 2020-02-22: 2.8 mg via INTRAVENOUS
  Filled 2020-02-22: qty 3.5

## 2020-02-22 MED ORDER — DEXTROSE 50 % IV SOLN
INTRAVENOUS | Status: AC
  Administered 2020-02-22: 25 g
  Filled 2020-02-22: qty 50

## 2020-02-22 MED ORDER — SODIUM CHLORIDE 0.9 % IV SOLN: 90.0000 mg | Freq: Once | INTRAVENOUS | Status: DC

## 2020-02-22 MED ORDER — DEXTROSE 50 % IV SOLN
INTRAVENOUS | Status: AC
  Administered 2020-02-22: 25 mL
  Filled 2020-02-22: qty 50

## 2020-02-22 MED ORDER — CHLORHEXIDINE GLUCONATE CLOTH 2 % EX PADS
6.0000 | MEDICATED_PAD | Freq: Every day | CUTANEOUS | Status: DC
  Administered 2020-02-22 – 2020-02-23 (×2): 6 via TOPICAL

## 2020-02-22 NOTE — Progress Notes (Signed)
eLink Physician-Brief Progress Note Patient Name: Phyllis Todd DOB: 1950/03/11 MRN: [id number]   Date of Service  02/22/2020  HPI/Events of Note  ABG on 40% BiPAP  16/5 = 7.261/68.8/95. ABG marginally improved from earlier.   eICU Interventions  Plan: 1. Increase IPAP to 18. 2. Repeat ABG at 7:30 AM.      Intervention Category Major Interventions: Acid-Base disturbance - evaluation and management;Respiratory failure - evaluation and management  Gabbi Whetstone Cornelia Copa 02/22/2020, 5:50 AM

## 2020-02-22 NOTE — Progress Notes (Signed)
eLink Physician-Brief Progress Note Patient Name: Phyllis Todd DOB: Nov 13, 1950 MRN: [id number]   Date of Service  02/22/2020  HPI/Events of Note  Nursing request for Q 4 hour POC CBG monitoring.   eICU Interventions  Will order Q 4 hour POC CBG monitoring.      Intervention Category Major Interventions: Other:  Lysle Dingwall 02/22/2020, 4:05 AM

## 2020-02-22 NOTE — Progress Notes (Signed)
Updated Mr. Rianna Lukes, patient's brother and health care proxy.   We discussed that patient has several life-threatening conditions at this time, including sepsis. I explained that we are concerned that this infection will not get better without surgery, for which she is not a candidate. I explained patient's AKI, respiratory failure, encephalopathy are confounding factors that make her current clinical condition very guarded. We discussed her high calcium and bony lesions concerning for MM. Juanda Crumble states they do have a family hx of MM and he is familiar with it.   Given overall poor prognosis, I introduced the idea of comfort measures as a reasonable option. Charles expressed understanding but states he would want Ms. Canal to choose that. He states her health has been poor for a long time and quality of life is important to her though. He is very worried about breaking the news of Ms. Bardon's illness to their Dad. Juanda Crumble is unable to visit at bedside at this time due to possible COVID-19 exposure, but will be tested tomorrow.   Dr. Jose Persia Internal Medicine PGY-2  02/22/2020, 12:26 PM

## 2020-02-22 NOTE — Progress Notes (Addendum)
NAME:  Phyllis Todd, MRN:  [id number], DOB:  September 04, 1950, LOS: 1 ADMISSION DATE:  02/19/2020, CONSULTATION DATE:  02/21/2019 REFERRING MD:  Lorin Mercy, CHIEF COMPLAINT:  SOB, Abdominal pain   Brief History:  70 yof initially admitted to [facility] for sepsis and volume overload. Imaging revealed a splenic fluid collection, uterine fistula, osseous changes involving the lumbosacrum, and a large left pleural effusion.  Throughout the day, she became hypotensive and mental/ respiratory status declined required BiPAP. PCCM consulted.  History of Present Illness:  75 yof with the medical hx listed below who presented to [facility]/10/2020 for weakness, LUQ abdominal pain, dyspnea, and peripheral edema.  Due to pt's deterioration in mental status since arrival to the ED, hx has been obtained from chart review.  She has had persistent abdominal pain since her prior hospitalization for an intraabdominal abscess 3 months ago. She had noted progressive weakness 2d prior to admission which she felt was similar to how she felt prior to last admission. She also notes lower extremity swelling and shortness of breath. Denies fever, chills, vomiting. Does endorse some nausea and a non-productive cough.   She was admitted 10/25-11/5/21 for sepsis secondary to an intra-abdominal abscess resulting in drain placement and was discharged with cefdinir and Augmentin.  Past Medical History:  OSA, hypertension, morbid obesity, abdominal abscess  Significant Hospital Events:  02/21/20: Admitted to [facility], Transferred to ICU  Consults:  Surgery PCCM  Procedures:  None   Significant Diagnostic Tests:  02/27/2020 CXR>>moderate left pleural effusion  1/10 CT abdomen/pelvis>>large heterogenous fluid collection in the spleen 10.4x15cm with surrounding inflammatory stranding, trace ascites, expansile lucent lesions seen in the L3-4 facet joints and flanking the anterior SI joints bilaterally.   1/10 L-spine MRI>>extensive erosive changes  and bony expansion centered around the L3-4 facet joints bilaterally left >R and the bilateral SI joints. Expansile nature of L3-4 facet impresses on the posterolateral thecal sac and neural foramen resulting in severe spinal and left foraminal stenosis. No evidence of osteomyelitis or discitis.  1/10 pelvic MRI>>air present in the vaginal and endometrial canal. Large air collection just above the uterus in the area of prior abscess. Questionable fistula?  Micro Data:  1/10 blood cultures>> 1/10 urine culture>>  Antimicrobials:  Cefepime 1/10>> Flagyl 1/10>> Vancomycin 1/10>>   Interim History / Subjective:   Overnight, patient's repeat ABG showed marginal improvement in pCO2, so Bipap was continued. No other overnight events noted.   This morning, patient will open her eyes but very lethargic. Unable to answer questions.   Objective   Blood pressure 113/61, pulse (!) 53, temperature (!) 97.2 F (36.2 C), temperature source Axillary, resp. rate 15, height 5' 4"  (1.626 m), weight 136.1 kg, SpO2 99 %.    FiO2 (%):  [40 %] 40 %   Intake/Output Summary (Last 24 hours) at 02/22/2020 0714 Last data filed at 02/22/2020 0600 Gross per 24 hour  Intake 2170.04 ml  Output 200 ml  Net 1970.04 ml   Filed Weights   02/19/2020 1256  Weight: 136.1 kg   Examination: Physical Exam Vitals and nursing note reviewed.  Constitutional:      Appearance: She is morbidly obese. She is ill-appearing.     Interventions: She is not intubated. HENT:     Head: Normocephalic and atraumatic.  Cardiovascular:     Rate and Rhythm: Regular rhythm. Bradycardia present.     Heart sounds: No murmur heard.   Pulmonary:     Effort: No accessory muscle usage. She is not  intubated.     Breath sounds: Examination of the left-middle field reveals rales. Examination of the left-lower field reveals rales. Rales present.  Abdominal:     Palpations: Abdomen is soft.     Tenderness: There is no guarding.   Musculoskeletal:     Right lower leg: 1+ Pitting Edema present.     Left lower leg: 1+ Pitting Edema present.  Skin:    General: Skin is warm and dry.  Neurological:     Mental Status: She is lethargic.     Comments: Disoriented and lethargic; difficult to rouse.     Assessment & Plan:   # Sepsis: Source undetermined but most likely intra-abdominal infection at site of previous abscess with new evidence of possible fistula. Alternative source may be pleural effusion. Blood cultures are NGTD at this time. General surgery consulted and at this time, not a candidate for intervention. Unfortunately, if the site of infection is intra-abdominal, source control will be very difficult to obtain. Patient is at high risk for deterioration at this time. Will need to discuss with family; if full scope of care is requested, will likely need a thoracentesis.  # Splenic fluid collection # Endometrial Fistula  # Hx of Sigmoid Abscess  - Continue Metronidazole, Cefepime, Vancomycin - Continue NS @ 75 cc/hr  - General surgery following; appreciate their recommendations   # Acute hypoxic and hypercapnic respiratory failure: In the setting of chronic uncontrolled OSA with now worsening encephalopathy. No significant improvement in pCO2 with BiPAP overnight. Mode changed to AVAPS. # Hx of OSA # Left pleural effusion: Infectious versus 2/2 to hx of heart failure. Will consider thoracentesis if full scope of care is pursued.  - Continue BiPAP, AVAPS mode  # Acute metabolic encephalopathy: Likely multifactorial due to hypercapnia, sepsis and severe hypercalcemia.  - Hypercalcemia treatment as listed below - Continue BiPAP  # Severe Hypercalcemia: Corrected for hypoalbuminemia ~ 13.0.  - LR bolus 1000 cc - Switch to NS infusion @ 74 cc/hr; could consider increasing rate, although high risk for volume overload.   # Acute renal failure: Likely pre-renal. Repeat labs this morning demonstrate mild improvement.  Approximately 400 UOP in the past 10 hours, although estimated as patient has purwick only. Electrolytes stable, although will need to keep a close watch over potassium.  # CKD Stage 3a  - Trend creatinine  - Monitor UOP closely; will consider foley catheter - Avoid nephrotoxic agents  # Sinus Bradycardia: Stable at this time.  - Telemetry   # HFrEF with recovered EF: Last echo with EF of 55-60%. Mildly hypervolemic at this time but holding diuretics in light of AKI. - Monitor closely for volume overload  - Holding ARB in the setting of AKI  # Erosive Osseous Changes, L3-4: In conjunction with hypercalcemia, macrocytic anemia of unknown cause, overall concerning for multiple myeloma. If stabilized, consider work up with SPEP, UPEP, FLC assay.   # Chronic Macrocytic Anemia: Normal B12 and folate. Etiology undetermined. Hemoglobin stable at this time.  - Continue to monitor CBC  # Thrombocytopenia: Likely reactive - Continue to monitor   Best practice (evaluated daily)  Diet: NPO Pain/Anxiety/Delirium protocol (if indicated): Norco VAP protocol (if indicated): N?A DVT prophylaxis: Lovenox GI prophylaxis: N/A Glucose control: N/A Mobility: Bedrest  Disposition: ICU  Goals of Care:  Summary of discussion: Attempted to call brother x 2 this morning to discuss goals of care. Voice message left. Will attempt again this afternoon.  Code Status: DNR  ADDENDUM: Patient  more alert and oriented on re-examination. When asked who she would want making decisions on her behalf, Ms. Rae indicated Juanda Crumble, her brother. Code status re-visited. In the case of cardiac or respiratory decline, she states "just let me go."   Labs   CBC: Recent Labs  Lab 03/05/2020 1324 02/21/20 1648 02/21/20 1702 02/22/20 0446  WBC 15.4*  --  13.2*  --   NEUTROABS  --   --  11.4*  --   HGB 8.9* 9.2* 8.2* 8.8*  HCT 30.4* 27.0* 29.2* 26.0*  MCV 100.3*  --  105.8*  --   PLT 731*  --  799*  --    Basic  Metabolic Panel: Recent Labs  Lab 03/13/2020 1324 02/21/20 1648 02/22/20 0446  NA 138 139 142  K 5.1 4.9 5.1  CL 100  --   --   CO2 28  --   --   GLUCOSE 108*  --   --   BUN 49*  --   --   CREATININE 3.43*  --   --   CALCIUM 10.6*  --   --    GFR: Estimated Creatinine Clearance: 21.9 mL/min (A) (by C-G formula based on SCr of 3.43 mg/dL (H)). Recent Labs  Lab 03/07/2020 1324 02/21/20 1642 02/21/20 1702 02/21/20 1918 02/21/20 1933  PROCALCITON  --   --   --   --  0.62  WBC 15.4*  --  13.2*  --   --   LATICACIDVEN  --  0.8  --  1.2  --    Liver Function Tests: Recent Labs  Lab 02/28/2020 1324  AST 11*  ALT 14  ALKPHOS 119  BILITOT 0.7  PROT 7.3  ALBUMIN 1.8*   Recent Labs  Lab 03/06/2020 1324  LIPASE 32   No results for input(s): AMMONIA in the last 168 hours.  ABG    Component Value Date/Time   PHART 7.261 (L) 02/22/2020 0446   PCO2ART 68.8 (HH) 02/22/2020 0446   PO2ART 95 02/22/2020 0446   HCO3 31.5 (H) 02/22/2020 0446   TCO2 34 (H) 02/22/2020 0446   O2SAT 96.0 02/22/2020 0446    Coagulation Profile: No results for input(s): INR, PROTIME in the last 168 hours.  Cardiac Enzymes: Recent Labs  Lab 02/21/20 0356  CKTOTAL 20*   HbA1C: Hgb A1c MFr Bld  Date/Time Value Ref Range Status  12/08/2019 08:34 AM 5.5 4.8 - 5.6 % Final    Comment:    (NOTE)         Prediabetes: 5.7 - 6.4         Diabetes: >6.4         Glycemic control for adults with diabetes: <7.0    CBG: Recent Labs  Lab 02/21/20 2355 02/22/20 0345  GLUCAP 83 75    Review of Systems:   Negative except as noted above.   Past Medical History:  She,  has a past medical history of Acute combined systolic and diastolic heart failure ([place]) (06/2013), Aortic atherosclerosis ([place]), Diverticulitis, Diverticulosis, Enteritis, Hypertension, Morbid obesity ([place]), NASH (nonalcoholic steatohepatitis), Nephrolithiasis, NICM (nonischemic cardiomyopathy) ([place]) (06/2013), Obstructive sleep apnea (adult)  (pediatric) (06/2013), and Septic shock ([place]).   Surgical History:   Past Surgical History:  Procedure Laterality Date  . CARDIAC CATHETERIZATION  2015  . CESAREAN SECTION    . DILATION AND CURETTAGE OF UTERUS  1984  . IR RADIOLOGIST EVAL & MGMT  01/04/2020  . LEFT HEART CATHETERIZATION WITH CORONARY ANGIOGRAM N/A 07/12/2013   Procedure:  LEFT HEART CATHETERIZATION WITH CORONARY ANGIOGRAM;  Surgeon: Troy Sine, MD;  Location: [facility] CATH LAB;  Service: Cardiovascular;  Laterality: N/A;  . TONSILLECTOMY       Social History:   reports that she has never smoked. She has never used smokeless tobacco. She reports current alcohol use. She reports that she does not use drugs.   Family History:  Her family history includes Breast cancer in her maternal grandmother; CAD in her father; Diabetes in her brother; Heart failure in her mother; Hypertension in her brother, father, and mother; Stroke in her mother; Stroke (age of onset: 58) in her brother.   Allergies Allergies  Allergen Reactions  . Codeine Other (See Comments)    "makes me hyper"  . Demerol [Meperidine] Other (See Comments)    "sick"  . Lisinopril Cough  . Peanut-Containing Drug Products Hives and Swelling    "almonds"     Home Medications  Prior to Admission medications   Medication Sig Start Date End Date Taking? Authorizing Provider  acetaminophen (TYLENOL) 500 MG tablet Take 500 mg by mouth every 6 (six) hours as needed (pain).   Yes [provider]  carvedilol (COREG) 25 MG tablet TAKE (1) TABLET TWICE A DAY WITH FOOD---BREAKFAST AND SUPPER. Patient taking differently: Take 25 mg by mouth 2 (two) times daily with a meal. 02/24/19  Yes Larey Dresser, MD  furosemide (LASIX) 20 MG tablet Take 1 tablet (20 mg total) by mouth daily. Patient taking differently: Take 20-40 mg by mouth See admin instructions. Alternates 39m and 4105mdaily 12/17/19  Yes SaNita SellsMD  HYDROcodone-acetaminophen (NORCO/VICODIN)  5-325 MG tablet Take 1-2 tablets by mouth every 6 (six) hours as needed for moderate pain or severe pain. 12/17/19  Yes SaNita SellsMD  naproxen sodium (ALEVE) 220 MG tablet Take 220 mg by mouth daily as needed (pain).   Yes [provider]    Dr. IuJose Persianternal Medicine PGY-2  02/22/2020, 7:14 AM

## 2020-02-22 NOTE — Progress Notes (Addendum)
Initial Nutrition Assessment  DOCUMENTATION CODES:   Morbid obesity  INTERVENTION:    If unable to remove BiPAP and begin PO diet, and if within goals of care, recommend begin nutrition support. If able to use gut, consider Cortrak placement (service available M-W-F) for TF initiation: Osmolite 1.5 at 55 ml/h with Prosource TF 45 ml TID would provide 2100 kcal, 116 gm protein, 1006 ml free water daily.  NUTRITION DIAGNOSIS:   Inadequate oral intake related to inability to eat as evidenced by NPO status.  GOAL:   Patient will meet greater than or equal to 90% of their needs  MONITOR:   Diet advancement,PO intake,Labs  REASON FOR ASSESSMENT:   Consult Other (Comment) (nutritional goals)  ASSESSMENT:   70 yo female admitted with sepsis, volume overload. Imaging revealed splenic fluid collection, uterine fistula, L pleural effusion. PMH includes HTN, HF, NICM, morbid obesity, OSA, nephrolithiasis, diverticulitis, NASH.   Discussed patient in ICU rounds and with RN today. Patient developed hypotension, decline in mental status, and respiratory distress requiring BIPAP; transferred to the ICU on 1/10. Remains on BiPAP today. Patient is not stable enough for surgery at this time.  Medical team is discussing goals of care with patient's brother, may transition to comfort care.  Labs reviewed. BUN 50, Creatinine 3.19 CBG: 75-60-91-74-72  Medications reviewed and include IV antibiotics. IVF: NS at 100 ml/h  NUTRITION - FOCUSED PHYSICAL EXAM:  Flowsheet Row Most Recent Value  Orbital Region No depletion  Upper Arm Region No depletion  Thoracic and Lumbar Region No depletion  Buccal Region Unable to assess  Temple Region No depletion  Clavicle Bone Region No depletion  Clavicle and Acromion Bone Region No depletion  Scapular Bone Region Unable to assess  Dorsal Hand No depletion  Patellar Region No depletion  Anterior Thigh Region No depletion  Posterior Calf Region No  depletion  Edema (RD Assessment) Severe  Hair Reviewed  Eyes Unable to assess  Mouth Unable to assess  Skin Reviewed  Nails Reviewed       Diet Order:   Diet Order            Diet NPO time specified  Diet effective now                 EDUCATION NEEDS:   Not appropriate for education at this time  Skin:  Skin Assessment: Reviewed RN Assessment  Last BM:  PTA  Height:   Ht Readings from Last 1 Encounters:  03/09/2020 5\' 4"  (1.626 m)    Weight:   Wt Readings from Last 1 Encounters:  02/17/2020 136.1 kg    Ideal Body Weight:  54.5 kg  BMI:  Body mass index is 51.49 kg/m.  Estimated Nutritional Needs:   Kcal:  2050-2250  Protein:  100-130 gm  Fluid:  2 L    Lucas Mallow, RD, LDN, CNSC Please refer to Amion for contact information.

## 2020-02-22 NOTE — Progress Notes (Signed)
CC: Left upper quadrant abdominal pain, shortness of breath, peripheral edema  Subjective: She is confused and BiPAP.  I cannot really tell whether her abdomen is tender or not I do not think it is but I am not sure.  Objective: Vital signs in last 24 hours: Temp:  [94.4 F (34.7 C)-98.1 F (36.7 C)] 97.2 F (36.2 C) (01/11 0800) Pulse Rate:  [51-96] 64 (01/11 0800) Resp:  [14-26] 22 (01/11 0800) BP: (88-140)/(35-100) 119/71 (01/11 0800) SpO2:  [93 %-100 %] 99 % (01/11 0800) FiO2 (%):  [40 %] 40 % (01/10 1722) Last BM Date:  (PTA) 1170 IV 200 urine recorded No other intake or output recorded. Afebrile, vital signs are stable A.m. blood gas: 7.22, PCO2 75.8, PO2 107, HCO3 31.8 Potassium 4.9 Last creatinine 3.43 (1/9) MRI pelvis: Limited exam, gas in the endometrium suspicious for a fistula large air collection above the uterus noted on MRI and prior CT there was a large abscess in this area on prior CT scan.  Partially calcified degenerated fibroid projecting off the posterior aspect of the lower uterine segment/cervix Intake/Output from previous day: 01/10 0701 - 01/11 0700 In: 2170 [I.V.:1070; IV Piggyback:1100] Out: 200 [Urine:200] Intake/Output this shift: Total I/O In: 149.1 [I.V.:149.1] Out: -   Physical exam: Patient is confused on BiPAP and mumbling. Respiratory: On BiPAP Abdomen: Large abdomen I cannot really tell if she is tender but I do not think she is.  Few bowel sounds.  Lab Results:  Recent Labs    02/22/2020 1324 02/21/20 1648 02/21/20 1702 02/22/20 0446 02/22/20 0800  WBC 15.4*  --  13.2*  --   --   HGB 8.9*   < > 8.2* 8.8* 9.2*  HCT 30.4*   < > 29.2* 26.0* 27.0*  PLT 731*  --  799*  --   --    < > = values in this interval not displayed.    BMET Recent Labs    02/25/2020 1324 02/21/20 1648 02/22/20 0446 02/22/20 0800  NA 138   < > 142 141  K 5.1   < > 5.1 4.9  CL 100  --   --   --   CO2 28  --   --   --   GLUCOSE 108*  --   --    --   BUN 49*  --   --   --   CREATININE 3.43*  --   --   --   CALCIUM 10.6*  --   --   --    < > = values in this interval not displayed.   PT/INR No results for input(s): LABPROT, INR in the last 72 hours.  Recent Labs  Lab 02/22/2020 1324  AST 11*  ALT 14  ALKPHOS 119  BILITOT 0.7  PROT 7.3  ALBUMIN 1.8*     Lipase     Component Value Date/Time   LIPASE 32 02/19/2020 1324     Medications: . Chlorhexidine Gluconate Cloth  6 each Topical Daily  . docusate sodium  100 mg Oral BID  . enoxaparin (LOVENOX) injection  30 mg Subcutaneous Q24H  . sodium chloride flush  3 mL Intravenous Q12H  . vancomycin variable dose per unstable renal function (pharmacist dosing)   Does not apply See admin instructions   . ceFEPime (MAXIPIME) IV    . lactated ringers 75 mL/hr at 02/22/20 0800  . metronidazole 500 mg (02/22/20 0858)  . vancomycin      Assessment/Plan  Hypercarbic respiratory failure Left pleural effusion Hx of OSA Altered mental status/metabolic encephalopathy CAD Hx CHF Hypertension Diabetes AKI  - Creatinine 3.43(1/9) Anemia  -H/H 8.9/30.4>> 8.2/29.2>> 9.2/27 Morbid obesity BMI 22.63   Sepsis of uncertain etiology  -WBC 15.4 >> 13.2  - MRI 1/10: Splenic hematoma/fluid collection - CT 02/21/20 Suspicion of endometrial fistula site of the prior abdominal abscess -MRI 02/21/20 Left upper quadrant abdominal pain Hx intra-abdominal abscess/sigmoid diverticulitis  FEN: IV fluids/n.p.o. ID: Cefepime 1/10 >> day 2; Flagyl 1/11>> day 1 DVT: Lovenox Follow-up: TBD  Plan: I will review with Dr. Zenia Resides.  Her white counts improving with antibiotics.  Currently she is not stable for any surgical intervention, we will continue to follow along with you.      LOS: 1 day    Bernabe Dorce 02/22/2020 Please see Amion

## 2020-02-23 DIAGNOSIS — J9622 Acute and chronic respiratory failure with hypercapnia: Secondary | ICD-10-CM | POA: Diagnosis not present

## 2020-02-23 DIAGNOSIS — J9621 Acute and chronic respiratory failure with hypoxia: Secondary | ICD-10-CM | POA: Diagnosis not present

## 2020-02-23 LAB — CBC WITH DIFFERENTIAL/PLATELET
Abs Immature Granulocytes: 0.19 10*3/uL — ABNORMAL HIGH (ref 0.00–0.07)
Basophils Absolute: 0 10*3/uL (ref 0.0–0.1)
Basophils Relative: 0 %
Eosinophils Absolute: 0.1 10*3/uL (ref 0.0–0.5)
Eosinophils Relative: 1 %
HCT: 30.1 % — ABNORMAL LOW (ref 36.0–46.0)
Hemoglobin: 8.6 g/dL — ABNORMAL LOW (ref 12.0–15.0)
Immature Granulocytes: 1 %
Lymphocytes Relative: 6 %
Lymphs Abs: 0.8 10*3/uL (ref 0.7–4.0)
MCH: 29.9 pg (ref 26.0–34.0)
MCHC: 28.6 g/dL — ABNORMAL LOW (ref 30.0–36.0)
MCV: 104.5 fL — ABNORMAL HIGH (ref 80.0–100.0)
Monocytes Absolute: 0.9 10*3/uL (ref 0.1–1.0)
Monocytes Relative: 6 %
Neutro Abs: 11.6 10*3/uL — ABNORMAL HIGH (ref 1.7–7.7)
Neutrophils Relative %: 86 %
Platelets: 664 10*3/uL — ABNORMAL HIGH (ref 150–400)
RBC: 2.88 MIL/uL — ABNORMAL LOW (ref 3.87–5.11)
RDW: 19.4 % — ABNORMAL HIGH (ref 11.5–15.5)
WBC: 13.6 10*3/uL — ABNORMAL HIGH (ref 4.0–10.5)
nRBC: 0.1 % (ref 0.0–0.2)

## 2020-02-23 LAB — COMPREHENSIVE METABOLIC PANEL
ALT: 14 U/L (ref 0–44)
AST: 10 U/L — ABNORMAL LOW (ref 15–41)
Albumin: 1.9 g/dL — ABNORMAL LOW (ref 3.5–5.0)
Alkaline Phosphatase: 98 U/L (ref 38–126)
Anion gap: 10 (ref 5–15)
BUN: 47 mg/dL — ABNORMAL HIGH (ref 8–23)
CO2: 25 mmol/L (ref 22–32)
Calcium: 10.3 mg/dL (ref 8.9–10.3)
Chloride: 106 mmol/L (ref 98–111)
Creatinine, Ser: 3.05 mg/dL — ABNORMAL HIGH (ref 0.44–1.00)
GFR, Estimated: 16 mL/min — ABNORMAL LOW (ref >60)
Glucose, Bld: 83 mg/dL (ref 70–99)
Potassium: 4.8 mmol/L (ref 3.5–5.1)
Sodium: 141 mmol/L (ref 135–145)
Total Bilirubin: 0.5 mg/dL (ref 0.3–1.2)
Total Protein: 6.7 g/dL (ref 6.5–8.1)

## 2020-02-23 LAB — POCT I-STAT 7, (LYTES, BLD GAS, ICA,H+H)
Acid-Base Excess: 0 mmol/L (ref 0.0–2.0)
Bicarbonate: 26.7 mmol/L (ref 20.0–28.0)
Calcium, Ion: 1.33 mmol/L (ref 1.15–1.40)
HCT: 22 % — ABNORMAL LOW (ref 36.0–46.0)
Hemoglobin: 7.5 g/dL — ABNORMAL LOW (ref 12.0–15.0)
O2 Saturation: 88 %
Patient temperature: 98.3
Potassium: 4.6 mmol/L (ref 3.5–5.1)
Sodium: 140 mmol/L (ref 135–145)
TCO2: 28 mmol/L (ref 22–32)
pCO2 arterial: 53.9 mmHg — ABNORMAL HIGH (ref 32.0–48.0)
pH, Arterial: 7.302 — ABNORMAL LOW (ref 7.350–7.450)
pO2, Arterial: 60 mmHg — ABNORMAL LOW (ref 83.0–108.0)

## 2020-02-23 LAB — VITAMIN D 25 HYDROXY (VIT D DEFICIENCY, FRACTURES): Vit D, 25-Hydroxy: 46.29 ng/mL (ref 30–100)

## 2020-02-23 LAB — GLUCOSE, CAPILLARY
Glucose-Capillary: 77 mg/dL (ref 70–99)
Glucose-Capillary: 79 mg/dL (ref 70–99)

## 2020-02-23 MED ORDER — MORPHINE SULFATE (PF) 2 MG/ML IV SOLN: 1.0000 mg | INTRAVENOUS | Status: DC | PRN

## 2020-02-23 MED ORDER — POLYVINYL ALCOHOL 1.4 % OP SOLN
1.0000 [drp] | Freq: Four times a day (QID) | OPHTHALMIC | Status: DC | PRN
  Filled 2020-02-23: qty 15

## 2020-02-23 MED ORDER — CALCITONIN (SALMON) 200 UNIT/ML IJ SOLN
400.0000 [IU] | Freq: Two times a day (BID) | INTRAMUSCULAR | Status: DC
  Administered 2020-02-23: 400 [IU] via INTRAMUSCULAR
  Filled 2020-02-23 (×2): qty 2

## 2020-02-23 MED ORDER — MORPHINE SULFATE (PF) 2 MG/ML IV SOLN
1.0000 mg | INTRAVENOUS | Status: DC | PRN
  Administered 2020-02-23: 4 mg via INTRAVENOUS
  Filled 2020-02-23: qty 2

## 2020-02-23 MED ORDER — HALOPERIDOL LACTATE 5 MG/ML IJ SOLN: 0.5000 mg | INTRAMUSCULAR | Status: DC | PRN

## 2020-02-23 MED ORDER — MIDAZOLAM HCL 2 MG/2ML IJ SOLN: 0.5000 mg | Freq: Once | INTRAMUSCULAR | Status: DC

## 2020-02-23 MED ORDER — HALOPERIDOL LACTATE 2 MG/ML PO CONC
0.5000 mg | ORAL | Status: DC | PRN
  Filled 2020-02-23: qty 0.3

## 2020-02-23 MED ORDER — MORPHINE 100MG IN NS 100ML (1MG/ML) PREMIX INFUSION: 4.0000 mg/h | INTRAVENOUS | Status: DC

## 2020-02-23 MED ORDER — GLYCOPYRROLATE 0.2 MG/ML IJ SOLN: 0.2000 mg | INTRAMUSCULAR | Status: DC | PRN

## 2020-02-23 MED ORDER — ORAL CARE MOUTH RINSE: 15.0000 mL | Freq: Two times a day (BID) | OROMUCOSAL | Status: DC

## 2020-02-23 MED ORDER — WHITE PETROLATUM EX OINT
TOPICAL_OINTMENT | CUTANEOUS | Status: AC
  Filled 2020-02-23: qty 28.35

## 2020-02-23 MED ORDER — ACETAMINOPHEN 650 MG RE SUPP
650.0000 mg | Freq: Four times a day (QID) | RECTAL | Status: DC | PRN
Start: 2020-02-23 — End: 2020-02-23

## 2020-02-23 MED ORDER — HALOPERIDOL 0.5 MG PO TABS
0.5000 mg | ORAL_TABLET | ORAL | Status: DC | PRN
  Filled 2020-02-23: qty 1

## 2020-02-23 MED ORDER — LORAZEPAM 2 MG/ML IJ SOLN: 1.0000 mg | INTRAMUSCULAR | Status: DC | PRN

## 2020-02-23 MED ORDER — MIDAZOLAM HCL 2 MG/2ML IJ SOLN
0.2500 mg | Freq: Once | INTRAMUSCULAR | Status: AC
  Administered 2020-02-23: 0.25 mg via INTRAVENOUS
  Filled 2020-02-23: qty 2

## 2020-02-23 MED ORDER — ACETAMINOPHEN 325 MG PO TABS: 650.0000 mg | ORAL_TABLET | Freq: Four times a day (QID) | ORAL | Status: DC | PRN

## 2020-02-23 MED ORDER — BIOTENE DRY MOUTH MT LIQD: 15.0000 mL | OROMUCOSAL | Status: DC | PRN

## 2020-02-23 MED ORDER — GLYCOPYRROLATE 1 MG PO TABS: 1.0000 mg | ORAL_TABLET | ORAL | Status: DC | PRN

## 2020-02-24 ENCOUNTER — Ambulatory Visit: Payer: Medicare HMO | Admitting: Nurse Practitioner

## 2020-02-24 LAB — KAPPA/LAMBDA LIGHT CHAINS
Kappa free light chain: 270.9 mg/L — ABNORMAL HIGH (ref 3.3–19.4)
Kappa, lambda light chain ratio: 2.99 — ABNORMAL HIGH (ref 0.26–1.65)
Lambda free light chains: 90.7 mg/L — ABNORMAL HIGH (ref 5.7–26.3)

## 2020-02-24 LAB — URINE CULTURE: Culture: NO GROWTH

## 2020-02-24 LAB — CALCITRIOL (1,25 DI-OH VIT D): Vit D, 1,25-Dihydroxy: 23.8 pg/mL (ref 19.9–79.3)

## 2020-02-24 LAB — PARATHYROID HORMONE, INTACT (NO CA): PTH: 58 pg/mL (ref 15–65)

## 2020-02-25 LAB — PROTEIN ELECTROPHORESIS, SERUM
A/G Ratio: 0.5 — ABNORMAL LOW (ref 0.7–1.7)
Albumin ELP: 2.3 g/dL — ABNORMAL LOW (ref 2.9–4.4)
Alpha-1-Globulin: 0.4 g/dL (ref 0.0–0.4)
Alpha-2-Globulin: 0.8 g/dL (ref 0.4–1.0)
Beta Globulin: 0.8 g/dL (ref 0.7–1.3)
Gamma Globulin: 2.1 g/dL — ABNORMAL HIGH (ref 0.4–1.8)
Globulin, Total: 4.2 g/dL — ABNORMAL HIGH (ref 2.2–3.9)
Total Protein ELP: 6.5 g/dL (ref 6.0–8.5)

## 2020-02-26 LAB — CULTURE, BLOOD (ROUTINE X 2)
Culture: NO GROWTH
Culture: NO GROWTH
Special Requests: ADEQUATE

## 2020-03-08 DIAGNOSIS — I5022 Chronic systolic (congestive) heart failure: Secondary | ICD-10-CM | POA: Diagnosis not present

## 2020-03-08 DIAGNOSIS — I10 Essential (primary) hypertension: Secondary | ICD-10-CM | POA: Diagnosis not present

## 2020-03-14 ENCOUNTER — Encounter (HOSPITAL_COMMUNITY): Payer: Medicare HMO

## 2020-03-14 NOTE — Progress Notes (Signed)
Patient refusing to wear Bi-pap. States that she is done and ready to go and that she can't do it anymore. Patient requesting to speak with brother. Family called on phone. Patient taken off Bi-pap and placed on NRB mask. Elink called and updated on situation. Family at bedside now. VSS.   Patient having non-sustained runs of VTach and R-on-T PVCs. MD notified. No new orders received.   Family requesting medication for patient to be more comfortable. Elink MD notified. Order for Versed 0.25 mg received and administered. Patient is now refusing to wear NRB mask. Patient placed on 6L [place] with O2 sat of 97%.

## 2020-03-14 NOTE — Progress Notes (Signed)
NAME:  Jetta Murray, MRN:  [id number], DOB:  1951-02-03, LOS: 2 ADMISSION DATE:  02/15/2020, CONSULTATION DATE:  02/21/2019 REFERRING MD:  Lorin Mercy, CHIEF COMPLAINT:  SOB, Abdominal pain   Brief History:  23 yof initially admitted to [facility] for sepsis and volume overload. Imaging revealed a splenic fluid collection, uterine fistula, osseous changes involving the lumbosacrum, and a large left pleural effusion.  Throughout the day, she became hypotensive and mental/ respiratory status declined required BiPAP. PCCM consulted.  History of Present Illness:  39 yof with the medical hx listed below who presented to [facility] 02/21/2020 for weakness, LUQ abdominal pain, dyspnea, and peripheral edema.  Due to pt's deterioration in mental status since arrival to the ED, hx has been obtained from chart review.  She has had persistent abdominal pain since her prior hospitalization for an intraabdominal abscess 3 months ago. She had noted progressive weakness 2d prior to admission which she felt was similar to how she felt prior to last admission. She also notes lower extremity swelling and shortness of breath. Denies fever, chills, vomiting. Does endorse some nausea and a non-productive cough.   She was admitted 10/25-11/5/21 for sepsis secondary to an intra-abdominal abscess resulting in drain placement and was discharged with cefdinir and Augmentin.  Past Medical History:  OSA, hypertension, morbid obesity, abdominal abscess  Significant Hospital Events:  02/21/20: Admitted to [facility], Transferred to ICU  Consults:  Surgery PCCM  Procedures:  None   Significant Diagnostic Tests:  02/21/2020 CXR>>moderate left pleural effusion  1/10 CT abdomen/pelvis>>large heterogenous fluid collection in the spleen 10.4x15cm with surrounding inflammatory stranding, trace ascites, expansile lucent lesions seen in the L3-4 facet joints and flanking the anterior SI joints bilaterally.   1/10 L-spine MRI>>extensive erosive changes  and bony expansion centered around the L3-4 facet joints bilaterally left >R and the bilateral SI joints. Expansile nature of L3-4 facet impresses on the posterolateral thecal sac and neural foramen resulting in severe spinal and left foraminal stenosis. No evidence of osteomyelitis or discitis.  1/10 pelvic MRI>>air present in the vaginal and endometrial canal. Large air collection just above the uterus in the area of prior abscess. Questionable fistula?  Micro Data:  1/10 blood cultures>> 1/10 urine culture>>  Antimicrobials:  Cefepime 1/10>> Flagyl 1/10>> Vancomycin 1/10>>   Interim History / Subjective:   No overnight events.   This morning, patient's daughter and son-in-law are at bedside. Ms. Cutbirth appears confused. She endorses abdominal pain and SOB, stating "this is how it started."   Objective   Blood pressure 92/67, pulse 71, temperature 97.8 F (36.6 C), temperature source Axillary, resp. rate (!) 22, height _0  (1.626 m), weight 136.1 kg, SpO2 95 %.    FiO2 (%):  [40 %] 40 %   Intake/Output Summary (Last 24 hours) at 2020-03-21 0722 Last data filed at 03/21/20 0600 Gross per 24 hour  Intake 1843.88 ml  Output 850 ml  Net 993.88 ml   Filed Weights   03/10/2020 1256  Weight: 136.1 kg   Examination: Physical Exam Vitals and nursing note reviewed.  Constitutional:      Appearance: She is obese. She is ill-appearing.  HENT:     Head: Normocephalic and atraumatic.     Mouth/Throat:     Mouth: Mucous membranes are dry.  Cardiovascular:     Rate and Rhythm: Normal rate and regular rhythm.  Pulmonary:     Effort: Accessory muscle usage present.     Breath sounds: Decreased breath sounds (throughout) present.  Abdominal:     General: Bowel sounds are decreased.     Palpations: Abdomen is soft.  Musculoskeletal:     Right lower leg: 2+ Pitting Edema present.     Left lower leg: 2+ Pitting Edema present.  Skin:    General: Skin is warm and dry.   Neurological:     Mental Status: She is alert.     Comments: Oriented to person and place only.   Psychiatric:        Mood and Affect: Mood is anxious.    Assessment & Plan:   # Sepsis: Source is intra-abdominal infection at site of previous abscess with new evidence of possible fistula. General surgery consulted and at this time, not a candidate for intervention. Given overall prognosis, will not pursue IR drainage of of spleen lesion.  # Splenic fluid collection # Endometrial Fistula  # Hx of Sigmoid Abscess  - Continue Metronidazole, Cefepime, Vancomycin - General surgery following; appreciate their recommendations   # Acute hypoxic and hypercapnic respiratory failure: In the setting of chronic uncontrolled OSA with likely an aspect of obesity hypoventilation syndrome, with now worsening encephalopathy. Patient has been on nasal cannula overnight, however worsening confusion this morning concerning for recurrent hypercapnia. This morning, patient refused BiPAP.  # Hx of OSA # Left pleural effusion: Infectious versus 2/2 to hx of heart failure. On bedside ultrasound, no evidence of loculations.   - Continue supplemental oxygen PRN to maintain O2 saturation > 88% - Consider restarting BiPAP if patient will tolerate  # Acute metabolic encephalopathy: Likely multifactorial due to hypercapnia, sepsis and severe hypercalcemia. - Hypercalcemia treatment as listed below  # Severe Hypercalcemia: High suspicion for hypercalcemia of malignancy secondary to multiple myeloma. Improved today with a corrected calcium of 12.  # Erosive Osseous Changes, L3-4: In conjunction with hypercalcemia, macrocytic anemia of unknown cause, overall concerning for multiple myeloma. - s/p 1 dose of ZA - Calcitonin BID x 4 doses total  - SPEP, UPEP, FLC assay, PTH, Vitamin D level pending   # Acute renal failure: Initially pre-renal with minimal improvement with fluids. At this time, concerning for ATN given  granular casts on UA. Continues to have good urine output. Electrolytes stable.  # CKD Stage 3a  - Trend creatinine  - Monitor UOP closely - Avoid nephrotoxic agents  # Sinus Bradycardia: Stable at this time.  - Telemetry   # HFrEF with recovered EF: Last echo with EF of 55-60%. Mildly hypervolemic at this time but holding diuretics in light of AKI. - Monitor closely for volume overload  - Holding ARB in the setting of AKI  # Chronic Macrocytic Anemia: Normal B12 and folate. Etiology undetermined. Hemoglobin stable at this time.  - Continue to monitor CBC  # Thrombocytopenia: Likely reactive. Stable at this time.  - Continue to monitor   Best practice (evaluated daily)  Diet: NPO Pain/Anxiety/Delirium protocol (if indicated): Norco VAP protocol (if indicated): N?A DVT prophylaxis: Lovenox GI prophylaxis: N/A Glucose control: N/A Mobility: Bedrest  Disposition: ICU  Goals of Care:  Summary of discussion: Daughter updated at bedside. Will update brother today.  Code Status: DNR   Labs   CBC: Recent Labs  Lab 02/29/2020 1324 02/21/20 1648 02/21/20 1702 02/22/20 0446 02/22/20 0800 02/22/20 0937 03-05-20 0549  WBC 15.4*  --  13.2*  --   --  11.7* 13.6*  NEUTROABS  --   --  11.4*  --   --  9.8* 11.6*  HGB 8.9*   < >  8.2* 8.8* 9.2* 8.1* 8.6*  HCT 30.4*   < > 29.2* 26.0* 27.0* 28.3* 30.1*  MCV 100.3*  --  105.8*  --   --  104.0* 104.5*  PLT 731*  --  799*  --   --  649* 664*   < > = values in this interval not displayed.   Basic Metabolic Panel: Recent Labs  Lab 02/19/2020 1324 02/21/20 1648 02/22/20 0446 02/22/20 0800 02/22/20 0810 2020/03/04 0549  NA 138 139 142 141 139 141  K 5.1 4.9 5.1 4.9 5.1 4.8  CL 100  --   --   --  105 106  CO2 28  --   --   --  21* 25  GLUCOSE 108*  --   --   --  103* 83  BUN 49*  --   --   --  50* 47*  CREATININE 3.43*  --   --   --  3.19* 3.05*  CALCIUM 10.6*  --   --   --  11.5* 10.3   GFR: Estimated Creatinine Clearance: 24.7  mL/min (A) (by C-G formula based on SCr of 3.05 mg/dL (H)). Recent Labs  Lab 03/13/2020 1324 02/21/20 1642 02/21/20 1702 02/21/20 1918 02/21/20 1933 02/22/20 0937 2020/03/04 0549  PROCALCITON  --   --   --   --  0.62  --   --   WBC 15.4*  --  13.2*  --   --  11.7* 13.6*  LATICACIDVEN  --  0.8  --  1.2  --   --   --    Liver Function Tests: Recent Labs  Lab 03/12/2020 1324 02/22/20 0810 03/04/2020 0549  AST 11* 12* 10*  ALT _0 ALKPHOS 119 97 98  BILITOT 0.7 0.2* 0.5  PROT 7.3 6.4* 6.7  ALBUMIN 1.8* 1.8* 1.9*   Recent Labs  Lab 03/10/2020 1324  LIPASE 32   No results for input(s): AMMONIA in the last 168 hours.  ABG    Component Value Date/Time   PHART 7.226 (L) 02/22/2020 0800   PCO2ART 75.8 (HH) 02/22/2020 0800   PO2ART 107 02/22/2020 0800   HCO3 31.8 (H) 02/22/2020 0800   TCO2 34 (H) 02/22/2020 0800   O2SAT 97.0 02/22/2020 0800    Coagulation Profile: No results for input(s): INR, PROTIME in the last 168 hours.  Cardiac Enzymes: Recent Labs  Lab 02/21/20 0356  CKTOTAL 20*   HbA1C: Hgb A1c MFr Bld  Date/Time Value Ref Range Status  12/08/2019 08:34 AM 5.5 4.8 - 5.6 % Final    Comment:    (NOTE)         Prediabetes: 5.7 - 6.4         Diabetes: >6.4         Glycemic control for adults with diabetes: <7.0    CBG: Recent Labs  Lab 02/22/20 1143 02/22/20 1519 02/22/20 1910 02/22/20 2305 03/04/20 0305  GLUCAP 74 72 75 69* 77    Review of Systems:   Negative except as noted above.   Past Medical History:  She,  has a past medical history of Acute combined systolic and diastolic heart failure ([place]) (06/2013), Aortic atherosclerosis ([place]), Diverticulitis, Diverticulosis, Enteritis, Hypertension, Morbid obesity ([place]), NASH (nonalcoholic steatohepatitis), Nephrolithiasis, NICM (nonischemic cardiomyopathy) ([place]) (06/2013), Obstructive sleep apnea (adult) (pediatric) (06/2013), and Septic shock ([place]).   Surgical History:   Past Surgical History:   Procedure Laterality Date  . CARDIAC CATHETERIZATION  2015  . CESAREAN SECTION    .  DILATION AND CURETTAGE OF UTERUS  1984  . IR RADIOLOGIST EVAL & MGMT  01/04/2020  . LEFT HEART CATHETERIZATION WITH CORONARY ANGIOGRAM N/A 07/12/2013   Procedure: LEFT HEART CATHETERIZATION WITH CORONARY ANGIOGRAM;  Surgeon: Troy Sine, MD;  Location: [facility] CATH LAB;  Service: Cardiovascular;  Laterality: N/A;  . TONSILLECTOMY       Social History:   reports that she has never smoked. She has never used smokeless tobacco. She reports current alcohol use. She reports that she does not use drugs.   Family History:  Her family history includes Breast cancer in her maternal grandmother; CAD in her father; Diabetes in her brother; Heart failure in her mother; Hypertension in her brother, father, and mother; Stroke in her mother; Stroke (age of onset: 86) in her brother.   Allergies Allergies  Allergen Reactions  . Codeine Other (See Comments)    "makes me hyper"  . Demerol [Meperidine] Other (See Comments)    "sick"  . Lisinopril Cough  . Peanut-Containing Drug Products Hives and Swelling    "almonds"     Home Medications  Prior to Admission medications   Medication Sig Start Date End Date Taking? Authorizing Provider  acetaminophen (TYLENOL) 500 MG tablet Take 500 mg by mouth every 6 (six) hours as needed (pain).   Yes [provider]  carvedilol (COREG) 25 MG tablet TAKE (1) TABLET TWICE A DAY WITH FOOD---BREAKFAST AND SUPPER. Patient taking differently: Take 25 mg by mouth 2 (two) times daily with a meal. 02/24/19  Yes Larey Dresser, MD  furosemide (LASIX) 20 MG tablet Take 1 tablet (20 mg total) by mouth daily. Patient taking differently: Take 20-40 mg by mouth See admin instructions. Alternates 48m and 455mdaily 12/17/19  Yes SaNita SellsMD  HYDROcodone-acetaminophen (NORCO/VICODIN) 5-325 MG tablet Take 1-2 tablets by mouth every 6 (six) hours as needed for moderate pain or  severe pain. 12/17/19  Yes SaNita SellsMD  naproxen sodium (ALEVE) 220 MG tablet Take 220 mg by mouth daily as needed (pain).   Yes [provider]    Dr. [facility] PGY-2  1/31-Jan-20227:22 AM

## 2020-03-14 NOTE — Death Summary Note (Signed)
DEATH SUMMARY   Patient Details  Name: Phyllis Todd MRN: [id number] DOB: 12-12-1950  Admission/Discharge Information   Admit Date:  02/13/2020  Date of Death: Date of Death: Feb 26, 2020  Time of Death: Time of Death: 05/23/33  Length of Stay: 2  Referring Physician: Joyice Faster, FNP   Reason(s) for Hospitalization  acute hypoxemic and hypercarbic respiratory failure  Diagnoses  Preliminary cause of death:  acute hypoxemic and hypercarbic respiratory failure Secondary Diagnoses (including complications and co-morbidities):  Principal Problem:   Acute on chronic respiratory failure with hypoxia and hypercapnia ([place]) Active Problems:   Essential hypertension   OSA (obstructive sleep apnea)   Chronic systolic CHF (congestive heart failure) ([place])   Morbid obesity with BMI of 50.0-59.9, adult (HCC)   Intra-abdominal abscess (HCC)   AKI (acute kidney injury) ([place])   Pleural effusion on left   Acute metabolic encephalopathy   Hypoalbuminemia due to protein-calorie malnutrition [facility])   Brief Hospital Course (including significant findings, care, treatment, and services provided and events leading to death)  Phyllis Todd is a 70 y.o. year old female who was admitted with acute on chronic hypoxemic and hypercarbic respiratory failure. Likely due to infection with septic shock, source intraabdominal. 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 Surgery was consulted. No good surgical options and deemed not a surgical candidate per PCCM and surgical teams. Patient was adamant about DNR. She had progressive multiorgan failure including renal failure. Sever hypercalcemia with concern for myeloma given anemia and bone lesions newly discovered on scans this admission. She refused BiPAP as it was uncomfortable. She became encephalopathic. Discussed GOC with family and recommended comfort care based on our understanding of her healthcare values. They agreed. She passed  peacefully and comfortably.    Pertinent Labs and Studies  Significant Diagnostic Studies CT ABDOMEN PELVIS WO CONTRAST  Result Date: 02/21/2020 CLINICAL DATA:  Abdominal abscess/infection suspected. Diffuse abdominal pain going on for a couple of days. EXAM: CT ABDOMEN AND PELVIS WITHOUT CONTRAST TECHNIQUE: Multidetector CT imaging of the abdomen and pelvis was performed following the standard protocol without IV contrast. COMPARISON:  01/04/2020. FINDINGS: Lower chest: Collapse/consolidation in the lingula and left lower lobe with a moderate left pleural effusion. Heart is enlarged. No pericardial effusion. Hepatobiliary: Liver and gallbladder are unremarkable. No biliary ductal dilatation. Pancreas: Negative. Spleen: There is a large heterogeneous fluid collection in the spleen, measuring approximately 10.4 x 15.0 cm, new from 01/04/2020. Mild surrounding inflammatory stranding. Adrenals/Urinary Tract: Adrenal glands are unremarkable. Small staghorn type calculus in the right kidney. Stones are seen in the left kidney. Subcentimeter high and low-attenuation lesions in the kidneys are too small to characterize. Ureters are decompressed. Bladder is low in volume. Stomach/Bowel: Stomach is decompressed. Small bowel, appendix and colon are unremarkable. Vascular/Lymphatic: Atherosclerotic calcification of the aorta without aneurysm. Scattered lymph nodes are not enlarged by CT size criteria. Reproductive: Uterus is visualized. An air-filled loop of bowel is seen between the bladder and uterus. Fluid fills the vagina. No adnexal mass. Other: Trace ascites in the left paracolic gutter. Small right inguinal hernia contains fat. Musculoskeletal: Asymmetric soft tissue haziness and stranding in the subcutaneous fat overlying the lower left ribs and spleen. No rib fracture. Expansile lucent lesions are again seen in the L3-4 facet joints and flanking the anterior sacroiliac joints bilaterally. The left L3-4 facet  joint lesion impinges on the spinal canal, narrowing it. Degenerative changes in the spine. IMPRESSION: 1. Large, mildly complex fluid collection in the spleen, new  from 01/04/2020. Given overlying haziness and stranding in the subcutaneous soft tissues, post-traumatic hematoma is favored. An abscess cannot be excluded. 2. Moderate left pleural effusion with collapse/consolidation in the left lower lobe. 3. Trace ascites. 4. Bilateral renal stones. 5. Expansile lucent lesions in the L3-4 facet joints and anterior sacroiliac joints, as before. Associated narrowing of the spinal canal at the L3-4 level. Please correlate clinically. Lumbar spine and pelvic MRI without and with contrast could be performed in further evaluation, as clinically indicated. 6.  Aortic atherosclerosis (ICD10-I70.0). Electronically Signed   By: Lorin Picket M.D.   On: 02/21/2020 08:48   DG Chest 2 View  Result Date: 02/25/2020 CLINICAL DATA:  Left-sided chest pain and cough for several weeks. Shortness of breath. EXAM: CHEST - 2 VIEW COMPARISON:  12/07/2019 FINDINGS: Moderate left pleural effusion is new since previous study, with associated compressive atelectasis. Right lung remains clear. No evidence of pneumothorax. Heart size remains stable. IMPRESSION: New moderate left pleural effusion and compressive atelectasis. Electronically Signed   By: Marlaine Hind M.D.   On: 02/16/2020 14:03   MR LUMBAR SPINE WO CONTRAST  Result Date: 02/21/2020 CLINICAL DATA:  Low back pain.  History of prostate cancer. EXAM: MRI LUMBAR SPINE WITHOUT CONTRAST TECHNIQUE: Multiplanar, multisequence MR imaging of the lumbar spine was performed. No intravenous contrast was administered. COMPARISON:  CT abdomen 12/06/2019, 12/14/2019, 02/21/2020 FINDINGS: Segmentation:  Standard. Alignment:  Physiologic. Vertebrae: No discitis or osteomyelitis. No acute osseous abnormality. Marrow heterogeneity is present. Although this can be caused by marrow infiltrative  processes, the most common causes include anemia, smoking, obesity, or advancing age. Extensive erosive changes and bony expansion centered around the L3-4 facet joints bilaterally left worse than right and to a lesser extent at L4-5. Erosive changes involving the anterior aspect of bilateral sacroiliac joints, right worse than left. The most severe erosion involves the left L3-4 facet with smooth margins. The expansile nature of the left L3-4 facet impresses on the left posterolateral thecal sac and neural foramen resulting in multifactorial severe spinal stenosis and left foraminal stenosis. Conus medullaris and cauda equina: Conus extends to the T12 level. Conus and cauda equina appear normal. Paraspinal and other soft tissues: No acute paraspinal abnormality. Paraspinal muscle atrophy. Disc levels: Disc spaces: Degenerative disc disease with disc height loss at L2-3. T12-L1: Small left paracentral disc protrusion. No evidence of neural foraminal stenosis. No central canal stenosis. L1-L2: No significant disc bulge. No evidence of neural foraminal stenosis. No central canal stenosis. L2-L3: Broad-based disc bulge. Mild bilateral facet arthropathy. Bilateral lateral recess stenosis. No evidence of neural foraminal stenosis. Moderate spinal stenosis. L3-L4: Mild broad-based disc bulge. Bilateral facet arthropathy with erosive changes involving bilateral facets most severe along the left with expansion. Severe spinal stenosis. Moderate left foraminal stenosis. No right foraminal stenosis. L4-L5: Minimal broad-based disc bulge. Moderate bilateral facet arthropathy with erosive changes particularly along the left L4-5 facet. No evidence of neural foraminal stenosis. No central canal stenosis. L5-S1: Minimal broad-based disc bulge. Mild bilateral facet arthropathy. No evidence of neural foraminal stenosis. No central canal stenosis. IMPRESSION: 1. Extensive erosive changes and bony expansion centered around the L3-4  facet joints bilaterally left worse than right and to a lesser extent at L4-5. The most severe erosion involves the left L3-4 facet with smooth margins. The expansile nature of the left L3-4 facet impresses on the left posterolateral thecal sac and neural foramen resulting in multifactorial severe spinal stenosis and left foraminal stenosis. Erosive changes involving the  anterior aspect of bilateral sacroiliac joints, right worse than left. The overall appearance is suggestive of amyloid or gout. If there is further clinical concern, recommend tissue diagnosis. 2. Lumbar spine spondylosis as described above. Electronically Signed   By: Kathreen Devoid   On: 02/21/2020 15:31   MR PELVIS WO CONTRAST  Result Date: 02/21/2020 CLINICAL DATA:  Follow-up CT scan. EXAM: MRI PELVIS WITHOUT CONTRAST TECHNIQUE: Multiplanar multisequence MR imaging of the pelvis was performed. No intravenous contrast was administered. COMPARISON:  CT scan, same date. Prior CT scans dating back to 12/06/2019 FINDINGS: Examination is limited as the patient could not hold still for the examination and would not tolerate the postcontrast exam. Urinary Tract:  The bladder is grossly normal. Bowel: The rectum and sigmoid colon are unremarkable. Scattered diverticulosis involving the upper sigmoid colon in particular. Vascular/Lymphatic: No significant vascular findings or overt adenopathy. Reproductive: There is air in the vagina and in the endometrial canal. There is a large air collection just above the uterus in the area of a previous abscess. A fistula is certainly a consideration. There is a complex heterogeneous mass along the posterior aspect of the lower uterine segment and cervix which is likely a partially calcified degenerated fibroid. Both ovaries are grossly normal. Other: Small amount of free pelvic fluid is noted. Diffuse subcutaneous inflammation. Musculoskeletal: Heterogeneous marrow signal likely due to obesity, smoking or anemia.  Do not see any worrisome bone lesions. There are smoothly marginated well corticated lesions associated with both SI joints, unchanged since prior CT scans. No findings suspicious for septic arthritis or worrisome bone lesion. IMPRESSION: 1. Limited examination. 2. Gas is noted in the endometrium suspicious for a fistula. Large air collection above the uterus noted on MRI and prior CT. There was a large abscess in this area on more remote CT scans. 3. Partially calcified degenerated fibroid projecting off the posterior aspect of the lower uterine segment/cervix. 4. No findings for septic arthritis or worrisome bone lesion. 5. Small amount of free pelvic fluid. Electronically Signed   By: Marijo Sanes M.D.   On: 02/21/2020 15:25   VAS [place] LOWER EXTREMITY VENOUS (DVT) (ONLY [facility] & [facility])  Result Date: 02/21/2020  Lower Venous DVT Study Indications: Edema, and elevated D-dimer.  Risk Factors: CHF. Limitations: Body habitus and edema. Comparison Study: no prior study on file Performing Technologist: Sharion Dove RVS  Examination Guidelines: A complete evaluation includes B-mode imaging, spectral Doppler, color Doppler, and power Doppler as needed of all accessible portions of each vessel. Bilateral testing is considered an integral part of a complete examination. Limited examinations for reoccurring indications may be performed as noted. The reflux portion of the exam is performed with the patient in reverse Trendelenburg.  +---------+---------------+---------+-----------+----------+-------------------+ RIGHT    CompressibilityPhasicitySpontaneityPropertiesThrombus Aging      +---------+---------------+---------+-----------+----------+-------------------+ CFV      Full                                         pulsatile waveform  +---------+---------------+---------+-----------+----------+-------------------+ SFJ      Full                                                              +---------+---------------+---------+-----------+----------+-------------------+  FV Prox  Full                                                             +---------+---------------+---------+-----------+----------+-------------------+ FV Mid   Full                                                             +---------+---------------+---------+-----------+----------+-------------------+ FV DistalFull                                                             +---------+---------------+---------+-----------+----------+-------------------+ PFV                                                   Not well visualized +---------+---------------+---------+-----------+----------+-------------------+ POP      Full                                         pulsatile waveform  +---------+---------------+---------+-----------+----------+-------------------+ PTV                                                   Not well visualized +---------+---------------+---------+-----------+----------+-------------------+ PERO                                                  Not well visualized +---------+---------------+---------+-----------+----------+-------------------+   +---------+---------------+---------+-----------+----------+-------------------+ LEFT     CompressibilityPhasicitySpontaneityPropertiesThrombus Aging      +---------+---------------+---------+-----------+----------+-------------------+ CFV                                                   patent by color and                                                       Doppler, pulsatile                                                        waveform            +---------+---------------+---------+-----------+----------+-------------------+  FV Prox  Full                                                             +---------+---------------+---------+-----------+----------+-------------------+ FV  Mid   Full                                                             +---------+---------------+---------+-----------+----------+-------------------+ FV DistalFull                                                             +---------+---------------+---------+-----------+----------+-------------------+ PFV                                                   Not well visualized +---------+---------------+---------+-----------+----------+-------------------+ POP      Full                                         pulsatile waveform  +---------+---------------+---------+-----------+----------+-------------------+ PTV                                                   Not well visualized +---------+---------------+---------+-----------+----------+-------------------+ PERO                                                  Not well visualized +---------+---------------+---------+-----------+----------+-------------------+     Summary: RIGHT: - There is no evidence of deep vein thrombosis in the lower extremity. However, portions of this examination were limited- see technologist comments above.  LEFT: - There is no evidence of deep vein thrombosis in the lower extremity. However, portions of this examination were limited- see technologist comments above.  *See table(s) above for measurements and observations. Electronically signed by Jamelle Haring on 02/21/2020 at 7:04:39 PM.    Final     Microbiology Recent Results (from the past 240 hour(s))  SARS CORONAVIRUS 2 (TAT 6-24 HRS) Nasopharyngeal Nasopharyngeal Swab     Status: None   Collection Time: 02/21/20  5:06 AM   Specimen: Nasopharyngeal Swab  Result Value Ref Range Status   SARS Coronavirus 2 NEGATIVE NEGATIVE Final    Comment: (NOTE) SARS-CoV-2 target nucleic acids are NOT DETECTED.  The SARS-CoV-2 RNA is generally detectable in upper and lower respiratory specimens during the acute phase of infection.  Negative results do not preclude SARS-CoV-2 infection, do not rule out co-infections with other pathogens, and should not be used as the sole basis for  treatment or other patient management decisions. Negative results must be combined with clinical observations, patient history, and epidemiological information. The expected result is Negative.  Fact Sheet for Patients: SugarRoll.be  Fact Sheet for Healthcare Providers: https://www.woods-mathews.com/  This test is not yet approved or cleared by the [place] FDA and  has been authorized for detection and/or diagnosis of SARS-CoV-2 by FDA under an Emergency Use Authorization (EUA). This EUA will remain  in effect (meaning this test can be used) for the duration of the COVID-19 declaration under Se ction 564(b)(1) of the Act, 21 U.S.C. section 360bbb-3(b)(1), unless the authorization is terminated or revoked sooner.  Performed at [facility], [place] [address], [place], [place] [address]   Culture, blood (x 2)     Status: None (Preliminary result)   Collection Time: 02/21/20  5:53 PM   Specimen: BLOOD RIGHT ARM  Result Value Ref Range Status   Specimen Description BLOOD RIGHT ARM  Final   Special Requests   Final    BOTTLES DRAWN AEROBIC AND ANAEROBIC Blood Culture results may not be optimal due to an inadequate volume of blood received in culture bottles   Culture   Final    NO GROWTH 3 DAYS Performed at [facility], [place] [address] [place], [place] [address]    Report Status PENDING  Incomplete  Culture, blood (x 2)     Status: None (Preliminary result)   Collection Time: 02/21/20  6:08 PM   Specimen: BLOOD  Result Value Ref Range Status   Specimen Description BLOOD SITE NOT SPECIFIED  Final   Special Requests   Final    BOTTLES DRAWN AEROBIC AND ANAEROBIC Blood Culture adequate volume   Culture   Final    NO GROWTH 3 DAYS Performed at [facility], [address] [place], [place] [address]    Report Status PENDING  Incomplete  MRSA PCR Screening     Status: None   Collection Time: 02/22/20  1:37 AM   Specimen: Nasopharyngeal  Result Value Ref Range Status   MRSA by PCR NEGATIVE NEGATIVE Final    Comment:        The GeneXpert MRSA Assay (FDA approved for NASAL specimens only), is one component of a comprehensive MRSA colonization surveillance program. It is not intended to diagnose MRSA infection nor to guide or monitor treatment for MRSA infections. Performed at [facility], [place] [address] [place], [place] [address]   Culture, Urine     Status: None   Collection Time: 02/22/20  6:38 PM   Specimen: Urine, Random  Result Value Ref Range Status   Specimen Description URINE, RANDOM  Final   Special Requests NONE  Final   Culture   Final    NO GROWTH Performed at [facility], [place] [address] [place], [place] [address]    Report Status 02/24/2020 FINAL  Final    Lab Basic Metabolic Panel: Recent Labs  Lab 02/22/2020 1324 02/21/20 1648 02/22/20 0446 02/22/20 0800 02/22/20 0810 02/22/20 1712 03/06/20 0549  NA 138   < > 142 141 139 140 141  K 5.1   < > 5.1 4.9 5.1 4.6 4.8  CL 100  --   --   --  105  --  106  CO2 28  --   --   --  21*  --  25  GLUCOSE 108*  --   --   --  103*  --  83  BUN 49*  --   --   --  50*  --  47*  CREATININE 3.43*  --   --   --  3.19*  --  3.05*  CALCIUM 10.6*  --   --   --  11.5*  --  10.3   < > = values in this interval not displayed.   Liver Function Tests: Recent Labs  Lab March 14, 2020 1324 02/22/20 0810 02/18/2020 0549  AST 11* 12* 10*  ALT 14 11 14   ALKPHOS 119 97 98  BILITOT 0.7 0.2* 0.5  PROT 7.3 6.4* 6.7  ALBUMIN 1.8* 1.8* 1.9*   Recent Labs  Lab March 14, 2020 1324  LIPASE 32   No results for input(s): AMMONIA in the last 168 hours. CBC: Recent Labs  Lab 03/14/20 1324 02/21/20 1648 02/21/20 1702 02/22/20 0446 02/22/20 0800 02/22/20 0937 02/22/20 1712 03/04/2020 0549   WBC 15.4*  --  13.2*  --   --  11.7*  --  13.6*  NEUTROABS  --   --  11.4*  --   --  9.8*  --  11.6*  HGB 8.9*   < > 8.2* 8.8* 9.2* 8.1* 7.5* 8.6*  HCT 30.4*   < > 29.2* 26.0* 27.0* 28.3* 22.0* 30.1*  MCV 100.3*  --  105.8*  --   --  104.0*  --  104.5*  PLT 731*  --  799*  --   --  649*  --  664*   < > = values in this interval not displayed.   Cardiac Enzymes: Recent Labs  Lab 02/21/20 0356  CKTOTAL 20*   Sepsis Labs: Recent Labs  Lab 2020-03-14 1324 02/21/20 1642 02/21/20 1702 02/21/20 1918 02/21/20 1933 02/22/20 0937 03/13/2020 0549  PROCALCITON  --   --   --   --  0.62  --   --   WBC 15.4*  --  13.2*  --   --  11.7* 13.6*  LATICACIDVEN  --  0.8  --  1.2  --   --   --     Procedures/Operations  As per EMR   Bonna Gains Koby Pickup 02/24/2020, 8:46 AM

## 2020-03-14 NOTE — Progress Notes (Signed)
eLink Physician-Brief Progress Note Patient Name: Phyllis Todd DOB: Jul 04, 1950 MRN: [id number]   Date of Service  02/22/2020  HPI/Events of Note  Spoke with patient's brother , Phyllis Todd, who requests medication for patient's anxiety to help her relax and get some rest. She looks comfortable at this time off of BiPAP and FM O2. Now on 6 L/min [place] with sat = 97% and RR = 20.   eICU Interventions  Plan: 1. Versed 0.25 mg IV X 1 now.      Intervention Category Major Interventions: Delirium, psychosis, severe agitation - evaluation and management  Toiya Morrish [place] 02/29/2020, 2:26 AM

## 2020-03-14 NOTE — Progress Notes (Addendum)
    CC: Left upper quadrant abdominal pain, shortness of breath, peripheral edema.  Subjective: Patient currently denying abdominal pain.  She is nontender on exam.  She is on a nasal cannula and her speech is clear this a.m.  Dr. Silas Flood was having a conversation with the family on treatment goals.  Objective: Vital signs in last 24 hours: Temp:  [97.2 F (36.2 C)-98.2 F (36.8 C)] 97.8 F (36.6 C) (01/11 2330) Pulse Rate:  [51-78] 71 (01/12 0701) Resp:  [11-36] 22 (01/12 0701) BP: (82-126)/(41-81) 92/67 (01/12 0701) SpO2:  [88 %-100 %] 95 % (01/12 0701) FiO2 (%):  [40 %] 40 % (01/11 1600) Last BM Date:  (PTA) 1843 IV 50 urine Afebrile, blood pressures dropped into the 80s early this a.m.  On BiPAP and now high flow nasal cannula. Creatinine 3.05, WBC 13.6 H/H 8.6/30  Intake/Output from previous day: 01/11 0701 - 01/12 0700 In: 1843.9 [I.V.:269; IV Piggyback:1499.9] Out: 850 [Urine:850] Intake/Output this shift: No intake/output data recorded.  General appearance: alert, cooperative and no distress Resp: Off BiPAP and appears comfortable on nasal cannula. GI: Soft, she denies any pain on palpation in all 4 quadrants.  No flatus reported, bowel sounds are hypoactive.  Lab Results:  Recent Labs    02/22/20 0937 03/19/20 0549  WBC 11.7* 13.6*  HGB 8.1* 8.6*  HCT 28.3* 30.1*  PLT 649* 664*    BMET Recent Labs    02/22/20 0810 03/19/2020 0549  NA 139 141  K 5.1 4.8  CL 105 106  CO2 21* 25  GLUCOSE 103* 83  BUN 50* 47*  CREATININE 3.19* 3.05*  CALCIUM 11.5* 10.3   PT/INR No results for input(s): LABPROT, INR in the last 72 hours.  Recent Labs  Lab 03/09/2020 1324 02/22/20 0810 2020-03-19 0549  AST 11* 12* 10*  ALT 14 11 14   ALKPHOS 119 97 98  BILITOT 0.7 0.2* 0.5  PROT 7.3 6.4* 6.7  ALBUMIN 1.8* 1.8* 1.9*     Lipase     Component Value Date/Time   LIPASE 32 02/13/2020 1324     Medications: . calcitonin  4 Units/kg (Adjusted) Intramuscular  BID  . Chlorhexidine Gluconate Cloth  6 each Topical Daily  . docusate sodium  100 mg Oral BID  . enoxaparin (LOVENOX) injection  30 mg Subcutaneous Q24H  . sodium chloride flush  3 mL Intravenous Q12H  . vancomycin variable dose per unstable renal function (pharmacist dosing)   Does not apply See admin instructions    Assessment/Plan Hypercarbic respiratory failure  -Currently refusing BiPAP. Left pleural effusion Hx of OSA Altered mental status/metabolic encephalopathy CAD Hx CHF Hypertension Diabetes AKI  - Creatinine 3.43>> 3.19>> 3.05 Anemia  -H/H 8.9/30.4>> 8.2/29.2>> 9.2/27>>8.6/30.1 Hypercalcemia  -Calcium 10.6>> 11.5>> 10.3  -L3-4 osseous changes -uncertain etiology Morbid obesity BMI 51.88   Sepsis of uncertain etiology  -WBC 15.4 >> 13.2>> 13.6  - MRI 1/10: Splenic hematoma/fluid collection - CT 02/21/20 Suspicion of endometrial fistula site of the prior abdominal abscess -MRI 02/21/20 Left upper quadrant abdominal pain Hx intra-abdominal abscess/sigmoid diverticulitis  FEN: IV fluids/n.p.o. ID: Cefepime 1/10 >> day 3; Flagyl 1/11>> day 2; vancomycin 1/11>>day 2 DVT: Lovenox Follow-up: TBD  Plan: From our standpoint ongoing conservative medical management with antibiotics.    LOS: 2 days    Shenika Quint 03/19/2020 Please see Amion

## 2020-03-14 NOTE — Progress Notes (Signed)
Patient re-evaluated at bedside. Phyllis Todd is somnolent and does not withdraw to painful stimuli, including sternal rub. On examination, she is bradycardic with systolic murmur; decreased breathe sounds, left worse than right.   Given patient's worsening encephalopathy, she is likely developing profound hypercapnia. She denied BiPAP earlier and at this time, is not a candidate for BiPAP.   Patient's brother contacted. He states he was able to visit last night and patient told him "I am going." Her wish was to see her brother and daughter; which family has been able to do. We discussed moving forward with comfort measures only. Phyllis Todd expressed agreement.   Plan:  - Comfort care only, orders placed.

## 2020-03-14 NOTE — Progress Notes (Signed)
Pt.'s brother, Mallie Giambra, states family will come to the hospital to pick up patient's belongings, which include clothes, one cell phone, one pair of glass, and a dufflebag. No jewelry present. No items held in pharmacy. Nursing staff and secretary made aware family is to pick up.

## 2020-03-14 NOTE — Progress Notes (Signed)
Pt. Cardiac time of death 1635 and pronounced by Mayford Knife, RN & Meda Klinefelter, RN. Bilateral breath sounds and cardiac rhythm/pulse absent on assessment. MD aware. Family notified.

## 2020-03-14 DEATH — deceased

## 2021-11-23 IMAGING — MR MR LUMBAR SPINE W/O CM
4 of 6 series · 18 of 48 positions shown · non-contrast
Comparison: CT abdomen 12/06/2019, 12/14/2019, 02/21/2020

CLINICAL DATA: Low back pain.  History of prostate cancer.

EXAM:
MRI LUMBAR SPINE WITHOUT CONTRAST
TECHNIQUE: Multiplanar, multisequence MR imaging of the lumbar spine was
performed. No intravenous contrast was administered.

[Series 3: T2 · sagittal · 4.0mm · 0.55mm/px · 4 of 15 slices shown (1 of 2)]
[im 1/15]
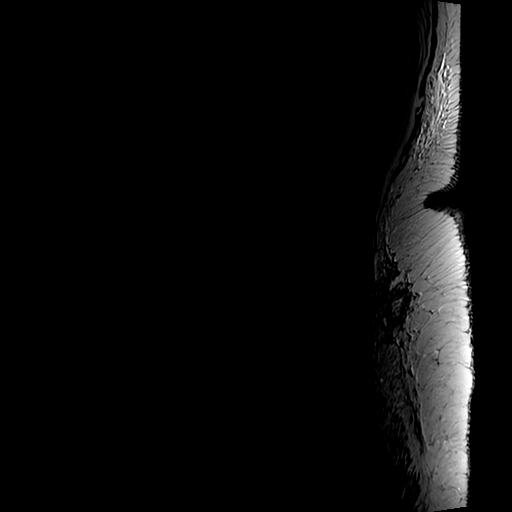
[im 5/15]
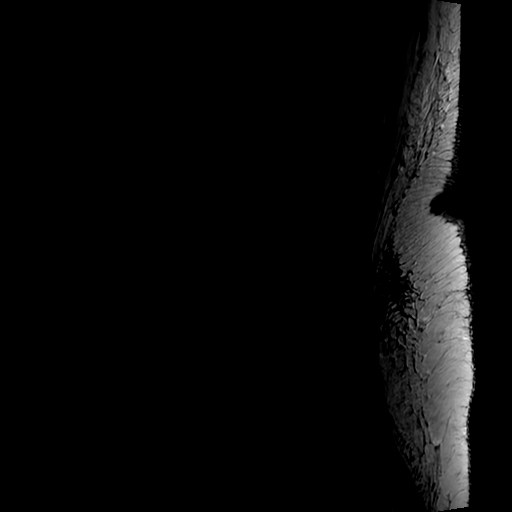
[im 10/15]
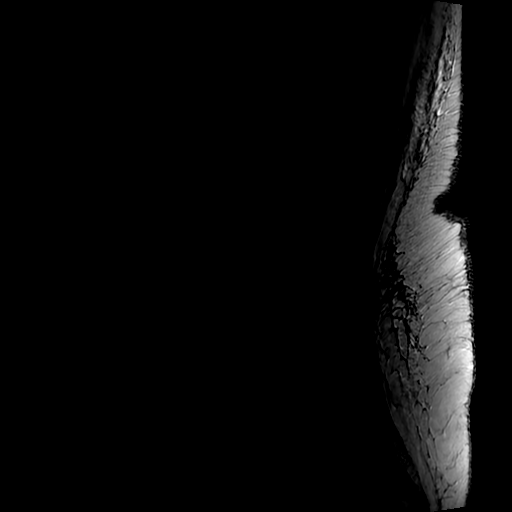
[im 15/15]
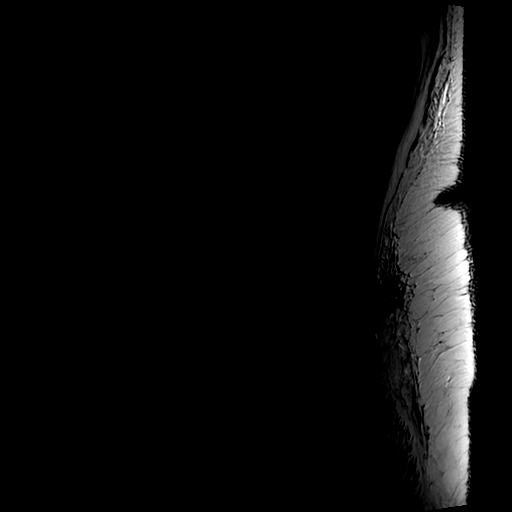

[Series 5: T1 · sagittal · 4.0mm · 0.51mm/px · 3 of 15 slices shown (1 of 2)]
[im 1/15]
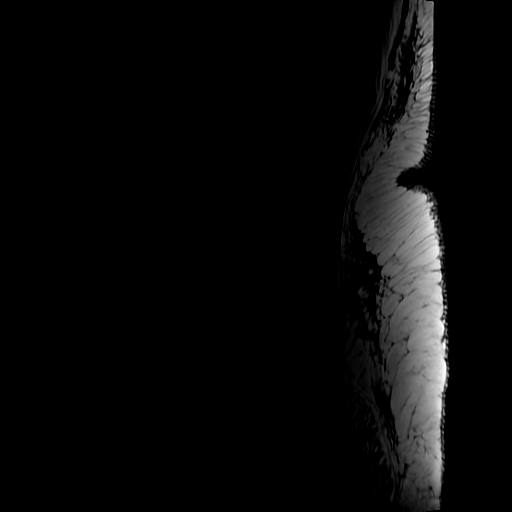
[im 10/15]
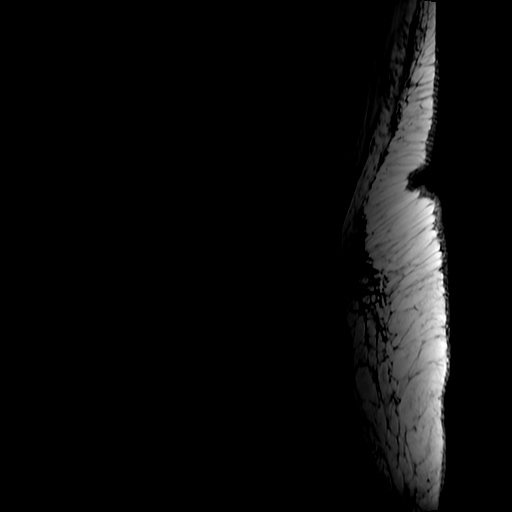
[im 15/15]
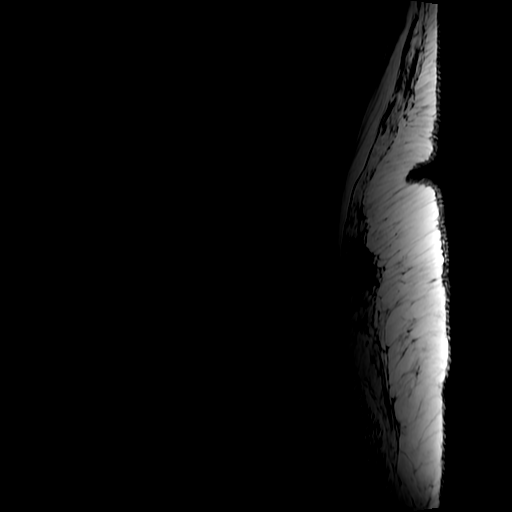

[Series 6: T2 · axial · 4.0mm · 0.39mm/px · z∈[-84,+116]mm · 8 of 39 slices shown (2 of 2)]
[im 1/39]
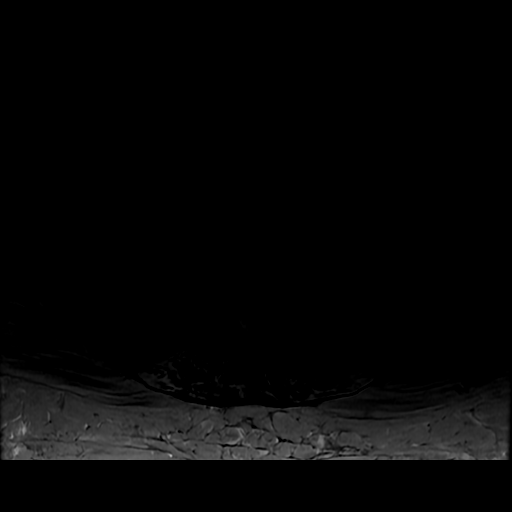
[im 7/39]
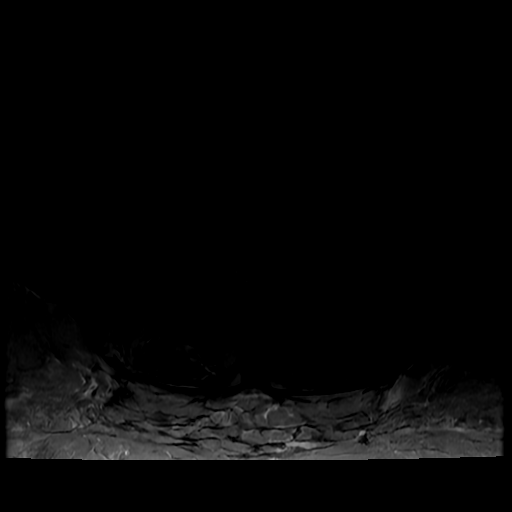
[im 11/39]
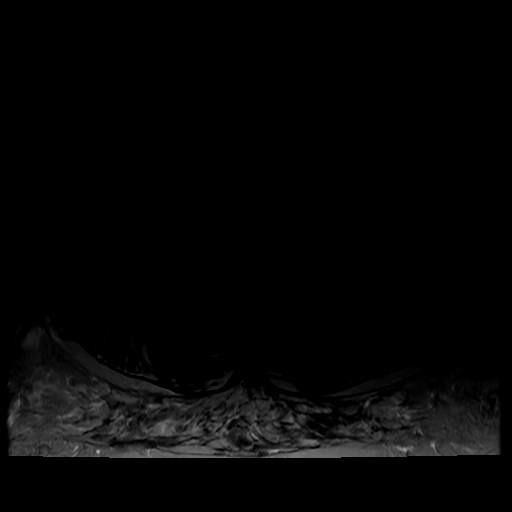
[im 18/39]
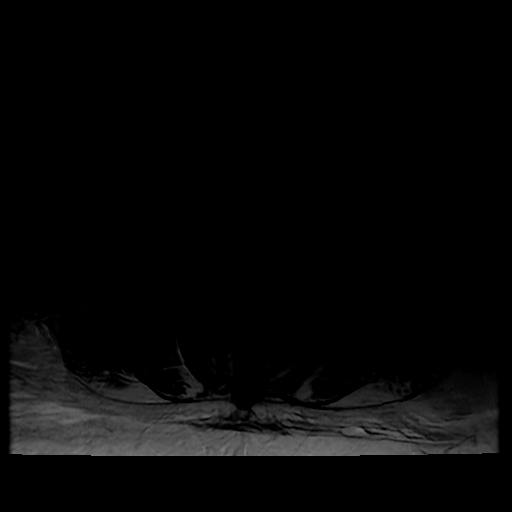
[im 21/39]
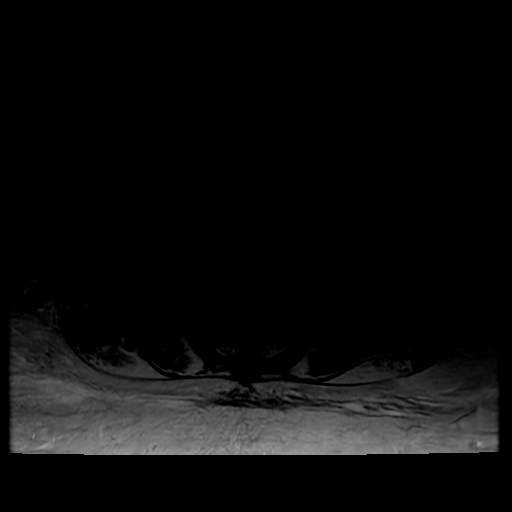
[im 28/39]
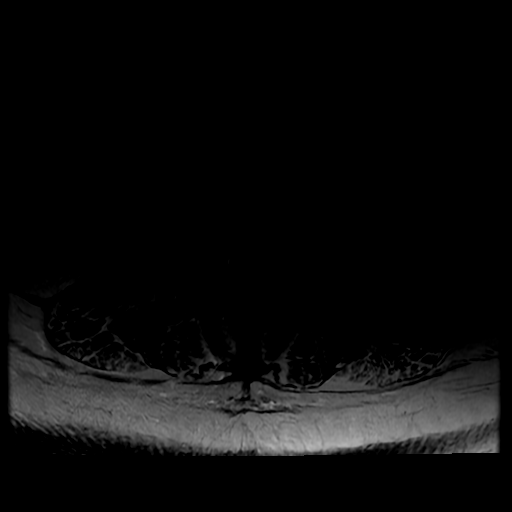
[im 32/39]
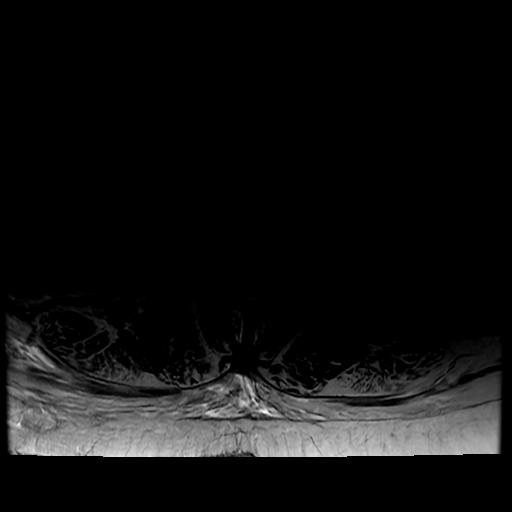
[im 35/39]
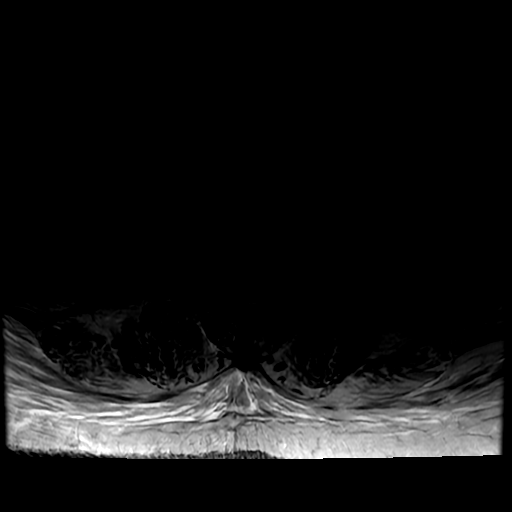

[Series 7: T1 · axial · 4.0mm · 0.39mm/px · z∈[-54,+116]mm · 3 of 39 slices shown (2 of 2)]
[im 7/39]
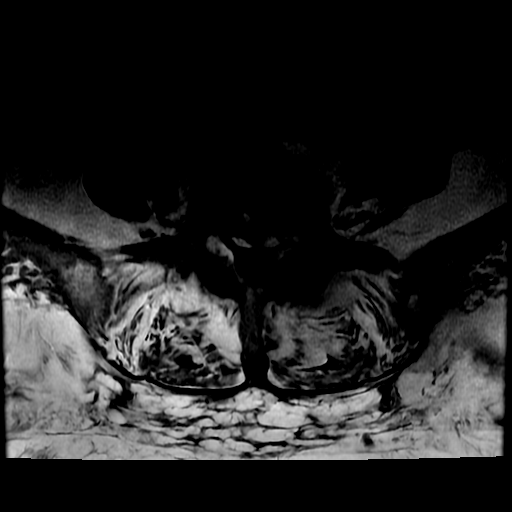
[im 21/39]
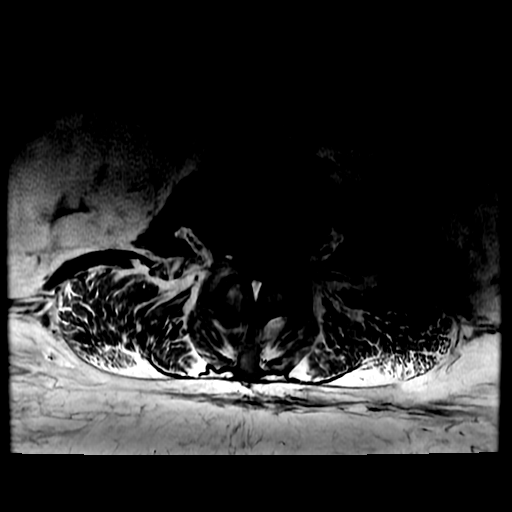
[im 35/39]
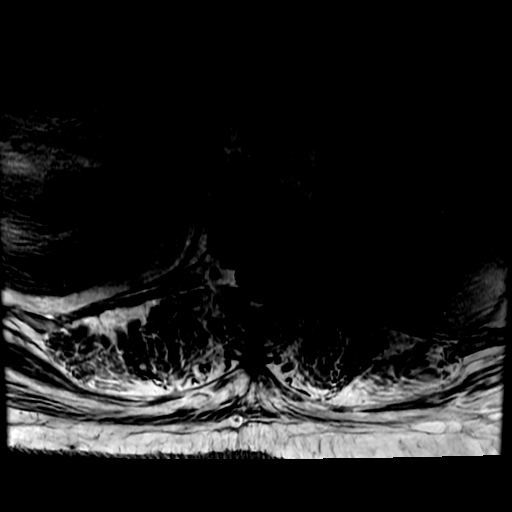

[18 of 48 positions shown; findings below may reference images not displayed]

FINDINGS: Segmentation:  Standard.

Alignment:  Physiologic.

Vertebrae: No discitis or osteomyelitis. No acute osseous
abnormality. Marrow heterogeneity is present. Although this can be
caused by marrow infiltrative processes, the most common causes
include anemia, smoking, obesity, or advancing age.

Extensive erosive changes and bony expansion centered around the
L3-4 facet joints bilaterally left worse than right and to a lesser
extent at L4-5. Erosive changes involving the anterior aspect of
bilateral sacroiliac joints, right worse than left. The most severe
erosion involves the left L3-4 facet with smooth margins. The
expansile nature of the left L3-4 facet impresses on the left
posterolateral thecal sac and neural foramen resulting in
multifactorial severe spinal stenosis and left foraminal stenosis.

Conus medullaris and cauda equina: Conus extends to the T12 level.
Conus and cauda equina appear normal.

Paraspinal and other soft tissues: No acute paraspinal abnormality.
Paraspinal muscle atrophy.

Disc levels:

Disc spaces: Degenerative disc disease with disc height loss at
L2-3.

T12-L1: Small left paracentral disc protrusion. No evidence of
neural foraminal stenosis. No central canal stenosis.

L1-L2: No significant disc bulge. No evidence of neural foraminal
stenosis. No central canal stenosis.

L2-L3: Broad-based disc bulge. Mild bilateral facet arthropathy.
Bilateral lateral recess stenosis. No evidence of neural foraminal
stenosis. Moderate spinal stenosis.

L3-L4: Mild broad-based disc bulge. Bilateral facet arthropathy with
erosive changes involving bilateral facets most severe along the
left with expansion. Severe spinal stenosis. Moderate left foraminal
stenosis. No right foraminal stenosis.

L4-L5: Minimal broad-based disc bulge. Moderate bilateral facet
arthropathy with erosive changes particularly along the left L4-5
facet. No evidence of neural foraminal stenosis. No central canal
stenosis.

L5-S1: Minimal broad-based disc bulge. Mild bilateral facet
arthropathy. No evidence of neural foraminal stenosis. No central
canal stenosis.
IMPRESSION: 1. Extensive erosive changes and bony expansion centered around the
L3-4 facet joints bilaterally left worse than right and to a lesser
extent at L4-5. The most severe erosion involves the left L3-4 facet
with smooth margins. The expansile nature of the left L3-4 facet
impresses on the left posterolateral thecal sac and neural foramen
resulting in multifactorial severe spinal stenosis and left
foraminal stenosis. Erosive changes involving the anterior aspect of
bilateral sacroiliac joints, right worse than left. The overall
appearance is suggestive of amyloid or gout. If there is further
clinical concern, recommend tissue diagnosis.
2. Lumbar spine spondylosis as described above.
